# Patient Record
Sex: Female | Born: 1955
Health system: Southern US, Community
[De-identification: ages and names within clinical notes are randomized; demographics above are authoritative.]

## PROBLEM LIST (undated history)

## (undated) DIAGNOSIS — R7303 Prediabetes: Secondary | ICD-10-CM

## (undated) DIAGNOSIS — F32A Depression, unspecified: Secondary | ICD-10-CM

## (undated) DIAGNOSIS — K219 Gastro-esophageal reflux disease without esophagitis: Secondary | ICD-10-CM

## (undated) DIAGNOSIS — R010 Benign and innocent cardiac murmurs: Secondary | ICD-10-CM

## (undated) DIAGNOSIS — Z98811 Dental restoration status: Secondary | ICD-10-CM

## (undated) DIAGNOSIS — F419 Anxiety disorder, unspecified: Secondary | ICD-10-CM

## (undated) DIAGNOSIS — C801 Malignant (primary) neoplasm, unspecified: Secondary | ICD-10-CM

## (undated) DIAGNOSIS — I10 Essential (primary) hypertension: Secondary | ICD-10-CM

## (undated) DIAGNOSIS — R21 Rash and other nonspecific skin eruption: Secondary | ICD-10-CM

## (undated) DIAGNOSIS — F329 Major depressive disorder, single episode, unspecified: Secondary | ICD-10-CM

## (undated) DIAGNOSIS — Z853 Personal history of malignant neoplasm of breast: Secondary | ICD-10-CM

## (undated) HISTORY — DX: Essential (primary) hypertension: I10

## (undated) HISTORY — DX: Depression, unspecified: F32.A

## (undated) HISTORY — PX: TUBAL LIGATION: SHX77

## (undated) HISTORY — DX: Major depressive disorder, single episode, unspecified: F32.9

## (undated) HISTORY — PX: COLONOSCOPY: SHX174

## (undated) HISTORY — PX: DILATION AND CURETTAGE OF UTERUS: SHX78

---

## 2000-03-19 ENCOUNTER — Other Ambulatory Visit: Admission: RE | Admit: 2000-03-19 | Discharge: 2000-03-19 | Payer: Self-pay | Admitting: Gynecology

## 2001-04-15 ENCOUNTER — Other Ambulatory Visit: Admission: RE | Admit: 2001-04-15 | Discharge: 2001-04-15 | Payer: Self-pay | Admitting: Gynecology

## 2002-11-03 ENCOUNTER — Other Ambulatory Visit: Admission: RE | Admit: 2002-11-03 | Discharge: 2002-11-03 | Payer: Self-pay | Admitting: Gynecology

## 2004-04-11 ENCOUNTER — Other Ambulatory Visit: Admission: RE | Admit: 2004-04-11 | Discharge: 2004-04-11 | Payer: Self-pay | Admitting: Gynecology

## 2012-03-03 ENCOUNTER — Other Ambulatory Visit: Payer: Self-pay | Admitting: Family Medicine

## 2012-03-03 DIAGNOSIS — R221 Localized swelling, mass and lump, neck: Secondary | ICD-10-CM

## 2012-03-03 DIAGNOSIS — R220 Localized swelling, mass and lump, head: Secondary | ICD-10-CM

## 2012-03-04 ENCOUNTER — Ambulatory Visit
Admission: RE | Admit: 2012-03-04 | Discharge: 2012-03-04 | Disposition: A | Discharge status: Home / Self Care | Payer: 59 | Source: Ambulatory Visit | Attending: Family Medicine | Admitting: Family Medicine

## 2012-03-04 DIAGNOSIS — R220 Localized swelling, mass and lump, head: Secondary | ICD-10-CM

## 2012-03-04 DIAGNOSIS — R221 Localized swelling, mass and lump, neck: Secondary | ICD-10-CM

## 2012-03-04 MED ORDER — IOHEXOL 300 MG/ML  SOLN
75.0000 mL | Freq: Once | INTRAMUSCULAR | Status: AC | PRN
  Administered 2012-03-04: 75 mL via INTRAVENOUS

## 2012-10-02 ENCOUNTER — Other Ambulatory Visit: Payer: Self-pay | Admitting: Family Medicine

## 2012-10-02 DIAGNOSIS — R142 Eructation: Secondary | ICD-10-CM

## 2012-10-06 ENCOUNTER — Other Ambulatory Visit: Payer: 59

## 2012-10-17 ENCOUNTER — Other Ambulatory Visit: Payer: 59

## 2012-10-20 ENCOUNTER — Other Ambulatory Visit: Payer: 59

## 2012-10-23 ENCOUNTER — Other Ambulatory Visit: Payer: 59

## 2012-10-23 ENCOUNTER — Inpatient Hospital Stay: Admission: RE | Admit: 2012-10-23 | Payer: 59 | Source: Ambulatory Visit

## 2016-11-30 ENCOUNTER — Other Ambulatory Visit: Payer: Self-pay | Admitting: Obstetrics & Gynecology

## 2016-11-30 DIAGNOSIS — R928 Other abnormal and inconclusive findings on diagnostic imaging of breast: Secondary | ICD-10-CM

## 2016-12-03 ENCOUNTER — Other Ambulatory Visit: Payer: Self-pay | Admitting: Obstetrics & Gynecology

## 2016-12-03 ENCOUNTER — Other Ambulatory Visit: Payer: Self-pay | Admitting: Obstetrics and Gynecology

## 2016-12-03 DIAGNOSIS — E2839 Other primary ovarian failure: Secondary | ICD-10-CM

## 2016-12-07 ENCOUNTER — Ambulatory Visit
Admission: RE | Admit: 2016-12-07 | Discharge: 2016-12-07 | Disposition: A | Discharge status: Home / Self Care | Payer: 59 | Source: Ambulatory Visit | Attending: Obstetrics & Gynecology | Admitting: Obstetrics & Gynecology

## 2016-12-07 DIAGNOSIS — E2839 Other primary ovarian failure: Secondary | ICD-10-CM

## 2016-12-07 DIAGNOSIS — R928 Other abnormal and inconclusive findings on diagnostic imaging of breast: Secondary | ICD-10-CM

## 2017-05-29 ENCOUNTER — Other Ambulatory Visit: Payer: Self-pay | Admitting: Family Medicine

## 2017-05-29 ENCOUNTER — Other Ambulatory Visit: Payer: Self-pay | Admitting: Obstetrics & Gynecology

## 2017-05-29 DIAGNOSIS — R921 Mammographic calcification found on diagnostic imaging of breast: Secondary | ICD-10-CM

## 2017-06-24 ENCOUNTER — Other Ambulatory Visit: Payer: Self-pay | Admitting: Obstetrics & Gynecology

## 2017-06-24 ENCOUNTER — Encounter: Payer: Self-pay | Admitting: Radiology

## 2017-06-24 ENCOUNTER — Ambulatory Visit
Admission: RE | Admit: 2017-06-24 | Discharge: 2017-06-24 | Disposition: A | Discharge status: Home / Self Care | Payer: 59 | Source: Ambulatory Visit | Attending: Obstetrics & Gynecology | Admitting: Obstetrics & Gynecology

## 2017-06-24 DIAGNOSIS — R921 Mammographic calcification found on diagnostic imaging of breast: Secondary | ICD-10-CM

## 2017-06-26 ENCOUNTER — Ambulatory Visit
Admission: RE | Admit: 2017-06-26 | Discharge: 2017-06-26 | Disposition: A | Discharge status: Home / Self Care | Payer: 59 | Source: Ambulatory Visit | Attending: Obstetrics & Gynecology | Admitting: Obstetrics & Gynecology

## 2017-06-26 ENCOUNTER — Other Ambulatory Visit: Payer: Self-pay | Admitting: Obstetrics & Gynecology

## 2017-06-26 DIAGNOSIS — N63 Unspecified lump in unspecified breast: Secondary | ICD-10-CM

## 2017-06-27 ENCOUNTER — Telehealth: Payer: Self-pay | Admitting: Hematology and Oncology

## 2017-06-27 NOTE — Telephone Encounter (Signed)
Confirmed with patient upcoming appointment for Breast Clinic on 07/03/17, emailed patient intake form to [email or web address]. 06/27/17

## 2017-06-29 DIAGNOSIS — Z853 Personal history of malignant neoplasm of breast: Secondary | ICD-10-CM

## 2017-06-29 DIAGNOSIS — C801 Malignant (primary) neoplasm, unspecified: Secondary | ICD-10-CM

## 2017-06-29 HISTORY — DX: Malignant (primary) neoplasm, unspecified: C80.1

## 2017-06-29 HISTORY — DX: Personal history of malignant neoplasm of breast: Z85.3

## 2017-07-02 ENCOUNTER — Other Ambulatory Visit: Payer: Self-pay | Admitting: *Deleted

## 2017-07-02 DIAGNOSIS — C50412 Malignant neoplasm of upper-outer quadrant of left female breast: Secondary | ICD-10-CM | POA: Insufficient documentation

## 2017-07-02 DIAGNOSIS — Z170 Estrogen receptor positive status [ER+]: Principal | ICD-10-CM

## 2017-07-03 ENCOUNTER — Encounter: Payer: Self-pay | Admitting: Hematology and Oncology

## 2017-07-03 ENCOUNTER — Other Ambulatory Visit (HOSPITAL_BASED_OUTPATIENT_CLINIC_OR_DEPARTMENT_OTHER): Payer: 59

## 2017-07-03 ENCOUNTER — Encounter: Payer: Self-pay | Admitting: Physical Therapy

## 2017-07-03 ENCOUNTER — Ambulatory Visit
Admission: RE | Admit: 2017-07-03 | Discharge: 2017-07-03 | Disposition: A | Discharge status: Home / Self Care | Payer: 59 | Source: Ambulatory Visit | Attending: Radiation Oncology | Admitting: Radiation Oncology

## 2017-07-03 ENCOUNTER — Ambulatory Visit (HOSPITAL_BASED_OUTPATIENT_CLINIC_OR_DEPARTMENT_OTHER): Payer: 59 | Admitting: Hematology and Oncology

## 2017-07-03 ENCOUNTER — Ambulatory Visit: Payer: 59 | Attending: General Surgery | Admitting: Physical Therapy

## 2017-07-03 DIAGNOSIS — Z170 Estrogen receptor positive status [ER+]: Principal | ICD-10-CM

## 2017-07-03 DIAGNOSIS — C50412 Malignant neoplasm of upper-outer quadrant of left female breast: Secondary | ICD-10-CM

## 2017-07-03 DIAGNOSIS — M25612 Stiffness of left shoulder, not elsewhere classified: Secondary | ICD-10-CM | POA: Insufficient documentation

## 2017-07-03 DIAGNOSIS — R293 Abnormal posture: Secondary | ICD-10-CM | POA: Insufficient documentation

## 2017-07-03 LAB — CBC WITH DIFFERENTIAL/PLATELET
BASO%: 0.5 % (ref 0.0–2.0)
BASOS ABS: 0 10*3/uL (ref 0.0–0.1)
EOS ABS: 0.1 10*3/uL (ref 0.0–0.5)
EOS%: 1.9 % (ref 0.0–7.0)
HEMATOCRIT: 40.4 % (ref 34.8–46.6)
HEMOGLOBIN: 13.2 g/dL (ref 11.6–15.9)
LYMPH#: 1.5 10*3/uL (ref 0.9–3.3)
LYMPH%: 23.2 % (ref 14.0–49.7)
MCH: 29.7 pg (ref 25.1–34.0)
MCHC: 32.7 g/dL (ref 31.5–36.0)
MCV: 90.8 fL (ref 79.5–101.0)
MONO#: 0.5 10*3/uL (ref 0.1–0.9)
MONO%: 7.3 % (ref 0.0–14.0)
NEUT#: 4.4 10*3/uL (ref 1.5–6.5)
NEUT%: 67.1 % (ref 38.4–76.8)
PLATELETS: 258 10*3/uL (ref 145–400)
RBC: 4.45 10*6/uL (ref 3.70–5.45)
RDW: 13.1 % (ref 11.2–14.5)
WBC: 6.5 10*3/uL (ref 3.9–10.3)

## 2017-07-03 LAB — COMPREHENSIVE METABOLIC PANEL
ALBUMIN: 3.7 g/dL (ref 3.5–5.0)
ALK PHOS: 60 U/L (ref 40–150)
ALT: 25 U/L (ref 0–55)
ANION GAP: 10 meq/L (ref 3–11)
AST: 19 U/L (ref 5–34)
BUN: 19.6 mg/dL (ref 7.0–26.0)
CHLORIDE: 106 meq/L (ref 98–109)
CO2: 26 mEq/L (ref 22–29)
Calcium: 9.5 mg/dL (ref 8.4–10.4)
Creatinine: 0.8 mg/dL (ref 0.6–1.1)
EGFR: 78 mL/min/{1.73_m2} — AB (ref >90)
Glucose: 93 mg/dl (ref 70–140)
POTASSIUM: 3.5 meq/L (ref 3.5–5.1)
Sodium: 142 mEq/L (ref 136–145)
Total Bilirubin: 0.53 mg/dL (ref 0.20–1.20)
Total Protein: 7.6 g/dL (ref 6.4–8.3)

## 2017-07-03 NOTE — Therapy (Signed)
[place], [place], [address] Phone: [phone]   Fax:  [phone]  Physical Therapy Evaluation  Patient Details  Name: ILINA XU MRN: [id number] Date of Birth: 07/21/1956 Referring Provider: Dr. Excell Seltzer  Encounter Date: 07/03/2017      PT End of Session - 07/03/17 1104    Visit Number 1   Number of Visits 1   PT Start Time 0947   PT Stop Time 1015   PT Time Calculation (min) 28 min   Activity Tolerance Patient tolerated treatment well   Behavior During Therapy [facility] for tasks assessed/performed      Past Medical History:  Diagnosis Date  . Depression   . Hypertension     History reviewed. No pertinent surgical history.  There were no vitals filed for this visit.       Subjective Assessment - 07/03/17 1026    Subjective Patient reports she is here today to be seen by her medical team for her newly diagnosed left breast cancer.   Patient is accompained by: Family member   Pertinent History Patient was diagnosed on 06/24/17 with left grade 1-2 invasive lobular carcinoma breast cancer. There are 2 areas of cancer located in the upper inner quadrant and upper outer quadrant which is an area of distortion with calcs. It is ER/PR positive and HER2 negative with a Ki67 of 2%. She had physical therapy earlier this year (patient estimated she had 8 visits) for left shoulder pain and stiffness which she reports is still an issue for her at times.   Patient Stated Goals Reduce lymphedema risk and learn post op shoulder ROM HEP   Currently in Pain? No/denies            [facility] PT Assessment - 07/03/17 0001      Assessment   Medical Diagnosis Left breast cancer   Referring Provider Dr. Excell Seltzer   Onset Date/Surgical Date 06/24/17   Hand Dominance Right   Prior Therapy none     Precautions   Precautions Other (comment)   Precaution Comments Active cancer     Restrictions    Weight Bearing Restrictions No     Balance Screen   Has the patient fallen in the past 6 months No   Has the patient had a decrease in activity level because of a fear of falling?  No   Is the patient reluctant to leave their home because of a fear of falling?  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; on phone and computer; some carrying and lifting up to 25#   Leisure She does not exercise     Cognition   Overall Cognitive Status Within Functional Limits for tasks assessed     Posture/Postural Control   Posture/Postural Control Postural limitations   Postural Limitations Rounded Shoulders;Forward head     ROM / Strength   AROM / PROM / Strength AROM;Strength     AROM   AROM Assessment Site Shoulder;Cervical   Right/Left Shoulder Right;Left   Right Shoulder Extension 50 Degrees   Right Shoulder Flexion 150 Degrees   Right Shoulder ABduction 140 Degrees   Right Shoulder Internal Rotation 63 Degrees   Right Shoulder External Rotation 87 Degrees  Left Shoulder Extension 48 Degrees   Left Shoulder Flexion 147 Degrees   Left Shoulder ABduction 152 Degrees   Left Shoulder Internal Rotation 70 Degrees   Left Shoulder External Rotation 77 Degrees   Cervical Flexion WNL   Cervical Extension WNL   Cervical - Right Side Bend WNL   Cervical - Left Side Bend 25% limited   Cervical - Right Rotation WNL   Cervical - Left Rotation 25% limited     Strength   Overall Strength Within functional limits for tasks performed           LYMPHEDEMA/ONCOLOGY QUESTIONNAIRE - 07/03/17 1102      Type   Cancer Type Left breast cancer     Lymphedema Assessments   Lymphedema Assessments Upper extremities     Right Upper Extremity Lymphedema   10 cm  Proximal to Olecranon Process 33.2 cm   Olecranon Process 28.1 cm   10 cm Proximal to Ulnar Styloid Process 23.3 cm   Just Proximal to Ulnar Styloid Process 15.6 cm   Across Hand at [employer] 17.8 cm   At [place] of 2nd Digit 5.8 cm     Left Upper Extremity Lymphedema   10 cm Proximal to Olecranon Process 32.1 cm   Olecranon Process 27.5 cm   10 cm Proximal to Ulnar Styloid Process 21.8 cm   Just Proximal to Ulnar Styloid Process 15.3 cm   Across Hand at [employer] 18 cm   At [place] of 2nd Digit 6.1 cm         Objective measurements completed on examination: See above findings.     Patient was instructed today in a home exercise program today for post op shoulder range of motion. These included active assist shoulder flexion in sitting, scapular retraction, wall walking with shoulder abduction, and hands behind head external rotation.  She was encouraged to do these twice a day, holding 3 seconds and repeating 5 times when permitted by her physician.         PT Education - 07/03/17 1103    Education provided Yes   Education Details Lymphedema risk reduction and post op shoulder ROM HEP   Person(s) Educated Patient;Spouse   Methods Explanation;Demonstration;Handout   Comprehension Returned demonstration;Verbalized understanding              Breast Clinic Goals - 07/03/17 1116      Patient will be able to verbalize understanding of pertinent lymphedema risk reduction practices relevant to her diagnosis specifically related to skin care.   Time 1   Period Days   Status Achieved     Patient will be able to return demonstrate and/or verbalize understanding of the post-op home exercise program related to regaining shoulder range of motion.   Time 1   Period Days   Status Achieved     Patient will be able to verbalize understanding of the importance of attending the postoperative After Breast Cancer Class for further lymphedema risk reduction education and  therapeutic exercise.   Time 1   Period Days   Status Achieved               Plan - 07/03/17 1104    Clinical Impression Statement Patient was diagnosed on 06/24/17 with left grade 1-2 invasive lobular carcinoma breast cancer. There are 2 areas of cancer located in the upper inner quadrant and upper outer quadrant which is an area of distortion with calcs. It is ER/PR positive and HER2 negative with a  Ki67 of 2%. She had physical therapy earlier this year (patient estimated she had 8 visits) for left shoulder pain and stiffness which she reports is still an issue for her at times. Her multidisciplinary medical team met prior to her assessments to determine a recommended treatment plan. She is planning to have a left mastectomy and sentinel node biopsy followed by anti-estrogen therapy. She may benefit from post op PT to regain shoulder ROM and reduce lymphedema risk.   History and Personal Factors relevant to plan of care: Previous recent left shoulder impairment requiring physical therapy which flares up at times.   Clinical Presentation Evolving   Clinical Presentation due to: Recently diagnosed with cancer; unsure of surgical decision to have double lumpectomy versus a mastectomy with or without reconstruction.   Clinical Decision Making Moderate   Rehab Potential Excellent   Clinical Impairments Affecting Rehab Potential Pre-existing shoulder dysfunction   PT Frequency One time visit   PT Treatment/Interventions Patient/family education;Therapeutic exercise   PT Next Visit Plan Will f/u after surgery to determine PT needs   PT Home Exercise Plan Post op shoulder ROM HEP   Consulted and Agree with Plan of Care Patient;Family member/caregiver   Family Member Consulted Husband      Patient will benefit from skilled therapeutic intervention in order to improve the following deficits and impairments:  Postural dysfunction, Decreased knowledge of precautions, Pain, Impaired UE functional  use, Decreased range of motion  Visit Diagnosis: Carcinoma of upper-outer quadrant of left breast in female, estrogen receptor positive ([place]) - Plan: PT plan of care cert/re-cert  Abnormal posture - Plan: PT plan of care cert/re-cert  Stiffness of left shoulder, not elsewhere classified - Plan: PT plan of care cert/re-cert   Patient will follow up at outpatient cancer rehab if needed following surgery.  If the patient requires physical therapy at that time, a specific plan will be dictated and sent to the referring physician for approval. The patient was educated today on appropriate basic range of motion exercises to begin post operatively and the importance of attending the After Breast Cancer class following surgery.  Patient was educated today on lymphedema risk reduction practices as it pertains to recommendations that will benefit the patient immediately following surgery.  She verbalized good understanding.  No additional physical therapy is indicated at this time.      Problem List Patient Active Problem List   Diagnosis Date Noted  . Malignant neoplasm of upper-outer quadrant of left breast in female, estrogen receptor positive ([place]) 07/02/2017    Annia Friendly, PT 07/03/17 11:18 AM  [place], [place], [address] Phone: [phone]   Fax:  [phone]  Name: HILA BOLDING MRN: [id number] Date of Birth: 25-Apr-1956

## 2017-07-03 NOTE — Progress Notes (Signed)
Nutrition Assessment  Reason for Assessment:  Pt seen in Breast Clinic  ASSESSMENT:   61 year old female with new diagnosis of breast cancer.  Past medical history of HTN, depression.    Patient reports normal appetite.  Medications:  reviewed  Labs: reviewed  Anthropometrics:   Height: 60 inches Weight: 200 lb BMI: 38   NUTRITION DIAGNOSIS: Food and nutrition related knowledge deficit related to new diagnosis of breast cancer as evidenced by no prior need for nutrition related information.  INTERVENTION:   Discussed and provided packet of information regarding nutritional tips for breast cancer patients.  Questions answered.  Teachback method used.  Contact information provided and patient knows to contact me with questions/concerns.    MONITORING, EVALUATION, and GOAL: Pt will consume a healthy plant based diet to maintain lean body mass throughout treatment.   Misty Lynch, Misty Lynch, Misty Lynch Registered Dietitian [phone] (pager)

## 2017-07-03 NOTE — Progress Notes (Signed)
[name]  Patient Care Team: Kathyrn Lass, MD as PCP - General (Family Medicine) Excell Seltzer, MD as Consulting Physician (General Surgery) Nicholas Lose, MD as Consulting Physician (Hematology and Oncology) Kyung Rudd, MD as Consulting Physician (Radiation Oncology)  CHIEF COMPLAINTS/PURPOSE OF CONSULTATION:  Newly diagnosed breast cancer  HISTORY OF PRESENTING ILLNESS:  Misty Lynch 61 y.o. female is here because of recent diagnosis of left breast cancer. Patient had a routine screening mammogram that detected an abnormality in the left breast but this was further evaluated by ultrasound. The left breast upper outer quadrant 2 mm area of calcifications along with distortion was noted this was biopsied. A second area off on distortion was noted in the upper inner quadrant which was also biopsied proven to be breast cancer. Both of these biopsies are 7.1 cm apart. A stereotactic biopsy was performed which revealed grade 1-2 invasive lobular cancer that is ER/PR positive HER-2 negative with a Ki-67 2%. She was presented this morning in the multidisciplinary tumor board and she is here today to discuss treatment plan.  I reviewed her records extensively and collaborated the history with the patient.  SUMMARY OF ONCOLOGIC HISTORY:   Malignant neoplasm of upper-outer quadrant of left breast in female, estrogen receptor positive ([name])   06/24/2017 Initial Diagnosis    Left breast biopsy upper outer quadrant: Invasive lobular cancer with LCIS; upper inner quadrant: Invasive lobular cancer with ADH, grade 1-2, ER 40%, PR 90%, Ki-67 2%, HER-2 negative ratio 1.19; tumor size indeterminate but based on the 2 mm calcifications it is currently being staged as T1 1 N0 stage IA, however we suspect that it is inaccurate clinical staging      MEDICAL HISTORY:  Past Medical History:  Diagnosis Date  . Depression   . Hypertension     SURGICAL HISTORY: History  reviewed. No pertinent surgical history.  SOCIAL HISTORY: Social History   Social History  . Marital status: Married    Spouse name: N/A  . Number of children: N/A  . Years of education: N/A   Occupational History  . Not on file.   Social History Main Topics  . Smoking status: Never Smoker  . Smokeless tobacco: Never Used  . Alcohol use 0.6 oz/week    1 Glasses of wine per week  . Drug use: No  . Sexual activity: Not on file   Other Topics Concern  . Not on file   Social History Narrative  . No narrative on file    FAMILY HISTORY: Family History  Problem Relation Age of Onset  . Bladder Cancer Father     ALLERGIES:  is allergic to tetracyclines & related.  MEDICATIONS:  Current Outpatient Prescriptions  Medication Sig Dispense Refill  . aspirin EC 81 MG tablet Take 81 mg by mouth daily.    . Calcium-Vitamin D-Vitamin K 500-100-40 MG-UNT-MCG CHEW Chew by mouth 2 (two) times daily with a meal.    . losartan-hydrochlorothiazide (HYZAAR) 100-25 MG tablet Take 1 tablet by mouth daily.    Marland Kitchen venlafaxine XR (EFFEXOR-XR) 75 MG 24 hr capsule Take 75 mg by mouth daily with breakfast.     No current facility-administered medications for this visit.     REVIEW OF SYSTEMS:   Constitutional: Denies fevers, chills or abnormal night sweats Eyes: Denies blurriness of vision, double vision or watery eyes Ears, nose, mouth, throat, and face: Denies mucositis or sore throat Respiratory: Denies cough, dyspnea or wheezes Cardiovascular: Denies palpitation, chest discomfort  or lower extremity swelling Gastrointestinal:  Denies nausea, heartburn or change in bowel habits Skin: Denies abnormal skin rashes Lymphatics: Denies new lymphadenopathy or easy bruising Neurological:Denies numbness, tingling or new weaknesses Behavioral/Psych: Mood is stable, no new changes  Breast:  Denies any palpable lumps or discharge All other systems were reviewed with the patient and are  negative.  PHYSICAL EXAMINATION: ECOG PERFORMANCE STATUS: 1 - Symptomatic but completely ambulatory  Vitals:   07/03/17 0846  BP: (!) 137/58  Pulse: 73  Resp: 18  Temp: 98.5 F (36.9 C)  SpO2: 98%   Filed Weights   07/03/17 0846  Weight: 200 lb 12.8 oz (91.1 kg)    GENERAL:alert, no distress and comfortable SKIN: skin color, texture, turgor are normal, no rashes or significant lesions EYES: normal, conjunctiva are pink and non-injected, sclera clear OROPHARYNX:no exudate, no erythema and lips, buccal mucosa, and tongue normal  NECK: supple, thyroid normal size, non-tender, without nodularity LYMPH:  no palpable lymphadenopathy in the cervical, axillary or inguinal LUNGS: clear to auscultation and percussion with normal breathing effort HEART: regular rate & rhythm and no murmurs and no lower extremity edema ABDOMEN:abdomen soft, non-tender and normal bowel sounds Musculoskeletal:no cyanosis of digits and no clubbing  PSYCH: alert & oriented x 3 with fluent speech NEURO: no focal motor/sensory deficits BREAST: No palpable nodules in breast. No palpable axillary or supraclavicular lymphadenopathy (exam performed in the presence of a chaperone)   LABORATORY DATA:  I have reviewed the data as listed Lab Results  Component Value Date   WBC 6.5 07/03/2017   HGB 13.2 07/03/2017   HCT 40.4 07/03/2017   MCV 90.8 07/03/2017   PLT 258 07/03/2017   Lab Results  Component Value Date   NA 142 07/03/2017   K 3.5 07/03/2017   CO2 26 07/03/2017    RADIOGRAPHIC STUDIES: I have personally reviewed the radiological reports and agreed with the findings in the report.  ASSESSMENT AND PLAN:  Malignant neoplasm of upper-outer quadrant of left breast in female, estrogen receptor positive ([name]) 06/24/2017: Left breast biopsy upper outer quadrant: Invasive lobular cancer with LCIS; upper inner quadrant: Invasive lobular cancer with ADH, grade 1-2, ER 40%, PR 90%, Ki-67 2%, HER-2 negative  ratio 1.19; tumor size indeterminate but based on the 2 mm calcifications it is currently being staged as T1a N0 stage IA, however we suspect that it is inaccurate clinical staging  Pathology and radiology counseling: Discussed with the patient, the details of pathology including the type of breast cancer,the clinical staging, the significance of ER, PR and HER-2/neu receptors and the implications for treatment. After reviewing the pathology in detail, we proceeded to discuss the different treatment options between surgery, radiation, chemotherapy, antiestrogen therapies.  Recommendation based on multidisciplinary tumor board 1. Patient would need mastectomy given the small size of the breasts and the wide extent of disease.. Unable to determine the actual size but she has 2 areas both of which were biopsy-proven invasive lobular cancers that are 7.1 cm apart. 2. followed by Oncotype DX if the final tumor size greater than 1 cm 3. Followed by adjuvant antiestrogen therapy with anastrozole 1 mg by mouth daily 7 years  Return to clinic after surgery to discuss final pathology report and to determine if Oncotype DX needs to be sent.    All questions were answered. The patient knows to call the clinic with any problems, questions or concerns.    Rulon Eisenmenger, MD 07/03/17

## 2017-07-03 NOTE — Patient Instructions (Signed)

## 2017-07-03 NOTE — Assessment & Plan Note (Signed)
06/24/2017: Left breast biopsy upper outer quadrant: Invasive lobular cancer with LCIS; upper inner quadrant: Invasive lobular cancer with ADH, grade 1-2, ER 40%, PR 90%, Ki-67 2%, HER-2 negative ratio 1.19; tumor size indeterminate but based on the 2 mm calcifications it is currently being staged as T1a N0 stage IA, however we suspect that it is inaccurate clinical staging  Pathology and radiology counseling: Discussed with the patient, the details of pathology including the type of breast cancer,the clinical staging, the significance of ER, PR and HER-2/neu receptors and the implications for treatment. After reviewing the pathology in detail, we proceeded to discuss the different treatment options between surgery, radiation, chemotherapy, antiestrogen therapies.  Recommendation based on multidisciplinary tumor board 1. Patient would need mastectomy given the small size of the breasts and the wide extent of disease.. Unable to determine the actual size but she has 2 areas both of which were biopsy-proven invasive lobular cancers that are 7.1 cm apart. 2. followed by Oncotype DX if the final tumor size greater than 1 cm 3. Followed by adjuvant antiestrogen therapy with anastrozole 1 mg by mouth daily 7 years  Return to clinic after surgery to discuss final pathology report and to determine if Oncotype DX needs to be sent.

## 2017-07-03 NOTE — Progress Notes (Signed)
Radiation Oncology         [phone] ________________________________  Name: Misty Lynch        MRN: [id number]  Date of Service: 07/03/2017 DOB: 1956-08-16  RS:WNIOEV, Misty Haw, MD  Excell Seltzer, MD     REFERRING PHYSICIAN: Excell Seltzer, MD   DIAGNOSIS: The encounter diagnosis was Malignant neoplasm of upper-outer quadrant of left breast in female, estrogen receptor positive (Misty Lynch).   HISTORY OF PRESENT ILLNESS: Misty Lynch is a 61 y.o. female seen in the multidisciplinary breast clinic for a new diagnosis of left breast cancer. She was found on diagnostic mammogram while being followed for 2 groups of calcifications to have stable change within one of the groups, but new distortion in the breast approximately 7 cm apart from the primary site. A biopsy on 06/24/17 of both locations revealed invasive lobular carcinoma with atypical ductal hyperplasia , in the upper inner quadrant, as well as invasive lobular carcinoma with LCIS in the upper outer quadrant, the grade of the cancer is 1-2, and her tumors were ER/PR positive, HER2 negative, with a Ki67 of 2%.  An ultrasound on 06/26/17 was negative for adenopathy. She comes today to discuss options of treatment for her cancer.  PREVIOUS RADIATION THERAPY: No   PAST MEDICAL HISTORY:  Past Medical History:  Diagnosis Date  . Depression   . Hypertension        PAST SURGICAL HISTORY:No past surgical history on file.   FAMILY HISTORY:  Family History  Problem Relation Age of Onset  . Bladder Cancer Father      SOCIAL HISTORY:  reports that she has never smoked. She has never used smokeless tobacco. She reports that she drinks about 0.6 oz of alcohol per week . She reports that she does not use drugs. The patient is married and lives in [place]. She works in Therapist, art.   ALLERGIES: Tetracyclines & related   MEDICATIONS:  Current Outpatient Prescriptions  Medication Sig Dispense Refill  . aspirin EC 81 MG  tablet Take 81 mg by mouth daily.    . Calcium-Vitamin D-Vitamin K 500-100-40 MG-UNT-MCG CHEW Chew by mouth 2 (two) times daily with a meal.    . losartan-hydrochlorothiazide (HYZAAR) 100-25 MG tablet Take 1 tablet by mouth daily.    Marland Kitchen venlafaxine XR (EFFEXOR-XR) 75 MG 24 hr capsule Take 75 mg by mouth daily with breakfast.     No current facility-administered medications for this encounter.      REVIEW OF SYSTEMS: On review of systems, the patient reports that she is doing well overall. She denies any chest pain, shortness of breath, cough, fevers, chills, night sweats, unintended weight changes. She denies any bowel or bladder disturbances, and denies abdominal pain, nausea or vomiting. She denies any new musculoskeletal or joint aches or pains. A complete review of systems is obtained and is otherwise negative.    PHYSICAL EXAM:  Wt Readings from Last 3 Encounters:  07/03/17 200 lb 12.8 oz (91.1 kg)   Temp Readings from Last 3 Encounters:  07/03/17 98.5 F (36.9 C) (Oral)   BP Readings from Last 3 Encounters:  07/03/17 (!) 137/58   Pulse Readings from Last 3 Encounters:  07/03/17 73      In general this is a well appearing caucasian female in no acute distress. She is alert and oriented x4 and appropriate throughout the examination. HEENT reveals that the patient is normocephalic, atraumatic. EOMs are intact. PERRLA. Skin is intact without any evidence of gross lesions.  Cardiovascular exam reveals a regular rate and rhythm, no clicks rubs or murmurs are auscultated. Chest is clear to auscultation bilaterally. Lymphatic assessment is performed and does not reveal any adenopathy in the cervical, supraclavicular, axillary, or inguinal chains. Bilateral breast exam is performed and reveals post biopsy change in the left breast with mild induration deep to the biopsy site. No palpable masses are noted otherwise. No masses are noted in the right breast, and neither breast has nipple bleeding  or discharge.  Abdomen has active bowel sounds in all quadrants and is intact. The abdomen is soft, non tender, non distended. Lower extremities are negative for pretibial pitting edema, deep calf tenderness, cyanosis or clubbing.   ECOG = 0  0 - Asymptomatic (Fully active, able to carry on all predisease activities without restriction)  1 - Symptomatic but completely ambulatory (Restricted in physically strenuous activity but ambulatory and able to carry out work of a light or sedentary nature. For example, light housework, office work)  2 - Symptomatic, <50% in bed during the day (Ambulatory and capable of all self care but unable to carry out any work activities. Up and about more than 50% of waking hours)  3 - Symptomatic, >50% in bed, but not bedbound (Capable of only limited self-care, confined to bed or chair 50% or more of waking hours)  4 - Bedbound (Completely disabled. Cannot carry on any self-care. Totally confined to bed or chair)  5 - Death   Misty Lynch MM, Misty Lynch, Misty Lynch, et al. [phone]). "Toxicity and response criteria of the [facility]". Am. Misty Lynch. Oncol. 5 (6): 649-55    LABORATORY DATA:  Lab Results  Component Value Date   WBC 6.5 07/03/2017   HGB 13.2 07/03/2017   HCT 40.4 07/03/2017   MCV 90.8 07/03/2017   PLT 258 07/03/2017   Lab Results  Component Value Date   NA 142 07/03/2017   K 3.5 07/03/2017   CO2 26 07/03/2017   Lab Results  Component Value Date   ALT 25 07/03/2017   AST 19 07/03/2017   ALKPHOS 60 07/03/2017   BILITOT 0.53 07/03/2017      RADIOGRAPHY: Mm Diag Breast Tomo Uni Left  Result Date: 06/24/2017 CLINICAL DATA:  Six-month follow-up for probably benign left breast calcifications. EXAM: 2D DIGITAL DIAGNOSTIC UNILATERAL LEFT MAMMOGRAM WITH CAD AND ADJUNCT TOMO COMPARISON:  Previous exam(s). ACR Breast Density Category c: The breast tissue is heterogeneously dense, which may obscure small masses. FINDINGS: In the  upper-outer quadrant of the left breast, posterior depth the 2 mm group of punctate and round calcifications have not significantly changed. However, associated with these calcifications there is a new subtle distorted appearance of the tissue warranting biopsy. The subtle calcifications in the medial aspect of the left breast are less prominent than on the comparison exam. Mammographic images were processed with CAD. IMPRESSION: 1. The calcifications in the upper-outer quadrant of the left breast are stable, however there is a new subtle distorted appearance to the tissue at this site warranting biopsy. 2. The faint calcifications in the medial aspect of the left breast are less apparent than on the comparison exam. RECOMMENDATION: Stereotactic biopsy is recommended for the calcifications with subtle distortion in the upper-outer left breast, as well as the faint calcifications in the medial left breast. These 2 procedures have been added on to today's interventional schedule. I have discussed the findings and recommendations with the patient. Results were also provided in writing at the conclusion  of the visit. If applicable, a reminder letter will be sent to the patient regarding the next appointment. BI-RADS CATEGORY  4: Suspicious. Electronically Signed   By: Ammie Ferrier M.D.   On: 06/24/2017 13:41   [place] Axilla Left  Result Date: 06/26/2017 CLINICAL DATA:  61 year old female with a recent diagnosis of invasive lobular carcinoma. EXAM: ULTRASOUND OF THE LEFT AXILLA COMPARISON:  None. FINDINGS: Ultrasound is performed, showing multiple morphologically normal lymph nodes within the left axilla. No suspicious lymphadenopathy is identified. IMPRESSION: No suspicious left axillary lymphadenopathy. RECOMMENDATION: Recommendation is for clinical and surgical follow-up for the patient's recently diagnosed left breast malignancy. I have discussed the findings and recommendations with the patient. Results were  also provided in writing at the conclusion of the visit. If applicable, a reminder letter will be sent to the patient regarding the next appointment. BI-RADS CATEGORY  2: Benign. Electronically Signed   By: Kristopher Oppenheim M.D.   On: 06/26/2017 14:01   Mm Clip Placement Left  Result Date: 06/24/2017 CLINICAL DATA:  60 year old female status post tomosynthesis guided biopsy of calcifications in the upper, outer left breast and within the upper, inner left breast EXAM: DIAGNOSTIC LEFT MAMMOGRAM POST STEREOTACTIC BIOPSY COMPARISON:  Previous exam(s). FINDINGS: Mammographic images were obtained following tomosynthesis guided biopsy of calcifications in the upper, outer left breast and within the upper, inner left breast. Post biopsy mammogram demonstrates the coil shaped biopsy marker to be in the expected location within the upper, outer left breast. The X shaped biopsy marker is approximately 2 cm superior to the expected location within the upper, inner left breast. IMPRESSION: 1. The coil shaped biopsy marker is in the expected location in the upper, outer left breast. 2. The X shaped biopsy marker is approximately 2 cm superior to the expected biopsy location in the upper, inner left breast. Final Assessment: Post Procedure Mammograms for Marker Placement Electronically Signed   By: Pamelia Hoit M.D.   On: 06/24/2017 10:39   Mm Lt Breast Bx W Loc Dev 1st Lesion Image Bx Spec Stereo Guide  Addendum Date: 06/26/2017   ADDENDUM REPORT: 06/26/2017 15:21 ADDENDUM: Pathology revealed INVASIVE LOBULAR CARCINOMA, LOBULAR CARCINOMA IN SITU WITH CALCIFICATIONS of the Left breast, upper outer quadrant. INVASIVE LOBULAR CARCINOMA., ATYPICAL DUCTAL HYPERPLASIA WITH CALCIFICATIONS of the Left breast, upper inner quadrant. This was found to be concordant by Dr. Pamelia Hoit. Pathology results were discussed with the patient by telephone. The patient reported doing well after the biopsies with tenderness at the sites. Post  biopsy instructions and care were reviewed and questions were answered. The patient was encouraged to call [facility] for any additional concerns. The patient was referred to [facility] on July 03, 2017. Recommendation for bilateral breast MRI given the lobular histology. Pathology results reported by Terie Purser, RN on 06/26/2017. Electronically Signed   By: Pamelia Hoit M.D.   On: 06/26/2017 15:21   Result Date: 06/26/2017 CLINICAL DATA:  61 year old female for tomosynthesis guided biopsy of indeterminate calcifications within the upper, outer left breast and within the upper, inner left breast. EXAM: LEFT BREAST STEREOTACTIC CORE NEEDLE BIOPSY COMPARISON:  Previous exams. FINDINGS: 1) The patient and I discussed the procedure of stereotactic-guided biopsy including benefits and alternatives. We discussed the high likelihood of a successful procedure. We discussed the risks of the procedure including infection, bleeding, tissue injury, clip migration, and inadequate sampling. Informed written consent was given.  The usual time out protocol was performed immediately prior to the procedure. Using sterile technique and 1% Lidocaine as local anesthetic, under stereotactic guidance, a 9 gauge vacuum assisted device was used to perform core needle biopsy of calcifications in the upper, outer quadrant of the left breast using a superior to inferior approach. Specimen radiograph was performed showing calcifications within the specimen. Specimens with calcifications are identified for pathology. Lesion quadrant: Upper outer quadrant At the conclusion of the procedure, a coil shaped tissue marker clip was deployed into the biopsy cavity. Follow-up 2-view mammogram was performed and dictated separately. 2) The patient and I discussed the procedure of stereotactic-guided biopsy including benefits and alternatives. We  discussed the high likelihood of a successful procedure. We discussed the risks of the procedure including infection, bleeding, tissue injury, clip migration, and inadequate sampling. Informed written consent was given. The usual time out protocol was performed immediately prior to the procedure. Using sterile technique and 1% Lidocaine as local anesthetic, under stereotactic guidance, a 9 gauge vacuum assisted device was used to perform core needle biopsy of calcifications in the upper, inner quadrant of the left breast using a superior to inferior approach. Specimen radiograph was performed showing calcifications within the specimen. Specimens with calcifications are identified for pathology. Lesion quadrant: Upper inner quadrant At the conclusion of the procedure, an X shaped tissue marker clip was deployed into the biopsy cavity. Follow-up 2-view mammogram was performed and dictated separately. IMPRESSION: 1. Stereotactic-guided biopsy of indeterminate calcifications in the upper, outer left breast. No apparent complications. 2. Stereotactic-guided biopsy of indeterminate calcifications in the upper, inner left breast. No apparent complications. Electronically Signed: By: Pamelia Hoit M.D. On: 06/24/2017 10:36   Mm Lt Breast Bx W Loc Dev Ea Ad Lesion Img Bx Spec Stereo Guide  Addendum Date: 06/26/2017   ADDENDUM REPORT: 06/26/2017 15:21 ADDENDUM: Pathology revealed INVASIVE LOBULAR CARCINOMA, LOBULAR CARCINOMA IN SITU WITH CALCIFICATIONS of the Left breast, upper outer quadrant. INVASIVE LOBULAR CARCINOMA., ATYPICAL DUCTAL HYPERPLASIA WITH CALCIFICATIONS of the Left breast, upper inner quadrant. This was found to be concordant by Dr. Pamelia Hoit. Pathology results were discussed with the patient by telephone. The patient reported doing well after the biopsies with tenderness at the sites. Post biopsy instructions and care were reviewed and questions were answered. The patient was encouraged to call [facility] for any additional concerns. The patient was referred to [facility] on July 03, 2017. Recommendation for bilateral breast MRI given the lobular histology. Pathology results reported by Terie Purser, RN on 06/26/2017. Electronically Signed   By: Pamelia Hoit M.D.   On: 06/26/2017 15:21   Result Date: 06/26/2017 CLINICAL DATA:  61 year old female for tomosynthesis guided biopsy of indeterminate calcifications within the upper, outer left breast and within the upper, inner left breast. EXAM: LEFT BREAST STEREOTACTIC CORE NEEDLE BIOPSY COMPARISON:  Previous exams. FINDINGS: 1) The patient and I discussed the procedure of stereotactic-guided biopsy including benefits and alternatives. We discussed the high likelihood of a successful procedure. We discussed the risks of the procedure including infection, bleeding, tissue injury, clip migration, and inadequate sampling. Informed written consent was given. The usual time out protocol was performed immediately prior to the procedure. Using sterile technique and 1% Lidocaine as local anesthetic, under stereotactic guidance, a 9 gauge vacuum assisted device was used to perform core needle biopsy of calcifications in the upper, outer quadrant of the left breast  using a superior to inferior approach. Specimen radiograph was performed showing calcifications within the specimen. Specimens with calcifications are identified for pathology. Lesion quadrant: Upper outer quadrant At the conclusion of the procedure, a coil shaped tissue marker clip was deployed into the biopsy cavity. Follow-up 2-view mammogram was performed and dictated separately. 2) The patient and I discussed the procedure of stereotactic-guided biopsy including benefits and alternatives. We discussed the high likelihood of a successful procedure. We discussed the risks of the procedure including infection,  bleeding, tissue injury, clip migration, and inadequate sampling. Informed written consent was given. The usual time out protocol was performed immediately prior to the procedure. Using sterile technique and 1% Lidocaine as local anesthetic, under stereotactic guidance, a 9 gauge vacuum assisted device was used to perform core needle biopsy of calcifications in the upper, inner quadrant of the left breast using a superior to inferior approach. Specimen radiograph was performed showing calcifications within the specimen. Specimens with calcifications are identified for pathology. Lesion quadrant: Upper inner quadrant At the conclusion of the procedure, an X shaped tissue marker clip was deployed into the biopsy cavity. Follow-up 2-view mammogram was performed and dictated separately. IMPRESSION: 1. Stereotactic-guided biopsy of indeterminate calcifications in the upper, outer left breast. No apparent complications. 2. Stereotactic-guided biopsy of indeterminate calcifications in the upper, inner left breast. No apparent complications. Electronically Signed: By: Pamelia Hoit M.D. On: 06/24/2017 10:36       IMPRESSION/PLAN: 1. At least Stage IA , ER/PR positive, grade 1-2 invasive lobular carcinoma with LCIS of the left breast. Dr. Lisbeth Renshaw discusses the pathology findings and reviews the nature of invasive lobular disease. The consensus from the breast conference includes either mastectomy with reconstruction versus  breast conservation with lumpectomy. Both surgical options would include sentinel mapping. Depending on the size of the tumor, oncotype may be ordered to determine a role for chemotherapy. If the patient did elect for conservation surgery, the patient should consider external radiotherapy to the breast followed by antiestrogen therapy. The timing of this would depend as well if there was a role for chemotherapy. We discussed the risks, benefits, short, and long term effects of radiotherapy. Dr. Lisbeth Renshaw  discusses the delivery and logistics of radiotherapy and again if she had lumpectomy, we would anticipate a 4 week course of radiation. We will follow up with her progress and her course once she's decided how she'd like to proceed surgically.  The above documentation reflects my direct findings during this shared patient visit. Please see the separate note by Dr. Lisbeth Renshaw on this date for the remainder of the patient's plan of care.    Carola Rhine, PAC

## 2017-07-05 ENCOUNTER — Encounter: Payer: Self-pay | Admitting: *Deleted

## 2017-07-09 ENCOUNTER — Telehealth: Payer: Self-pay | Admitting: *Deleted

## 2017-07-09 NOTE — Telephone Encounter (Signed)
Spoke to pt regarding [place] from 07/03/17. Denies questions or concerns regarding dx or treatment care plan. Pt relate she is contemplating having bilateral mastectomy and did not know if she was going to have reconstruction. Informed pt to call and let us know when she has decided. Received verbal understanding.

## 2017-07-18 ENCOUNTER — Telehealth: Payer: Self-pay | Admitting: *Deleted

## 2017-07-18 NOTE — Telephone Encounter (Signed)
Return pt call regarding sx. Left vm requesting pt to return call to discuss surgery decision. Contact information provided.

## 2017-07-18 NOTE — Telephone Encounter (Signed)
Pt called to relate she has decided to have left mastectomy without reconstruction. Physician team notified. Discussed next steps with pt

## 2017-07-22 ENCOUNTER — Ambulatory Visit: Payer: Self-pay | Admitting: General Surgery

## 2017-07-22 DIAGNOSIS — Z170 Estrogen receptor positive status [ER+]: Principal | ICD-10-CM

## 2017-07-22 DIAGNOSIS — C50912 Malignant neoplasm of unspecified site of left female breast: Secondary | ICD-10-CM

## 2017-07-26 ENCOUNTER — Ambulatory Visit: Payer: Self-pay | Admitting: General Surgery

## 2017-07-29 ENCOUNTER — Telehealth: Payer: Self-pay | Admitting: Hematology and Oncology

## 2017-07-29 NOTE — Telephone Encounter (Signed)
Left message for patient regarding their appts that were added per 10/1 sch msg

## 2017-08-01 NOTE — H&P (Signed)
Subjective:     Patient ID: Misty Lynch is a 61 y.o. female.  HPI  Here for follow up dicussion breast reconstruction. Presented following screening MMG with two areas calcifications left breast, 7.1 cm apart. Biopsy left UOQ with ILC with LCIS. Biopsy left UIQ with ILC with ADH. Both ER/PR+, Her2-. [place] axilla negative.  Mastectomy recommended given multicentric disease, small breast size. Patient has elected for bilateral mastectomies.  Current 38 B, happy with this. Wt stable.  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 Husband is Chief Strategy Officer.   Review of Systems     Objective:   Physical Exam  Cardiovascular: Normal rate, regular rhythm and normal heart sounds.   Pulmonary/Chest: Effort normal and breath sounds normal.  Lymphadenopathy:    She has no axillary adenopathy.   Pseudoptosis to grade 1 bilat Left<right volume small amount Left breast with nevus sebaceous over lower pole breast   SN to nipple R 26 L 24.5  BW R 19 L 18 cm (CW 14) Nipple to IMF R 9 L 8 cm No palpable masses  Assessment:     Left breat ca UOQ ER+ (ILC) Left breast ca UIQ ER+ (ILC)    Plan:     Plan bilateral mastectomies with immediate expander acellular dermis reconstruction. Reviewed incisions, drains, OR length, hospital stay and recovery for each. Reviewed process of expansion and implant based risks including rupture, MRI surveillance for silicone implants, infection requiring surgery or removal, contracture.    Discussed use of acellular dermis in reconstruction, cadaveric source, incorporation over several weeks, risk that if has seroma or infection can act as additional nidus for infection if not incorporated.  Discussed future surgery dependent on adjuvant treatments.  Reviewed SSM vs NSM , both will be asensate and not stimulate. Reviewed with  risks mastectomy flap necrosis requiring additional surgery. Reviewed incisions and  scars for each. Will need to confirm with Dr. Excell Seltzer- patient is comfortable with either approach.  Reviewed reconstruction will be asensate and not stimulate. Reviewed additional risks including but not limited to risks mastectomy flap necrosis requiring additional surgery, seroma, hematoma, asymmetry, need to additional procedures, fat necrosis, DVT/PE, damage to adjacent structures, cardiopulmonary complications.  Rx for Norco, Robaxin, and Bactrim given.Rx for post mastectomy supplies given.   [name], MD [facility] [phone], pin [phone]

## 2017-08-19 ENCOUNTER — Encounter (HOSPITAL_BASED_OUTPATIENT_CLINIC_OR_DEPARTMENT_OTHER): Payer: Self-pay | Admitting: *Deleted

## 2017-08-21 ENCOUNTER — Encounter (HOSPITAL_BASED_OUTPATIENT_CLINIC_OR_DEPARTMENT_OTHER)
Admission: RE | Admit: 2017-08-21 | Discharge: 2017-08-21 | Disposition: A | Discharge status: Home / Self Care | Payer: 59 | Source: Ambulatory Visit | Attending: General Surgery | Admitting: General Surgery

## 2017-08-21 DIAGNOSIS — N6081 Other benign mammary dysplasias of right breast: Secondary | ICD-10-CM | POA: Diagnosis not present

## 2017-08-21 DIAGNOSIS — Z818 Family history of other mental and behavioral disorders: Secondary | ICD-10-CM | POA: Diagnosis not present

## 2017-08-21 DIAGNOSIS — D0502 Lobular carcinoma in situ of left breast: Secondary | ICD-10-CM | POA: Diagnosis not present

## 2017-08-21 DIAGNOSIS — Z833 Family history of diabetes mellitus: Secondary | ICD-10-CM | POA: Diagnosis not present

## 2017-08-21 DIAGNOSIS — Z79899 Other long term (current) drug therapy: Secondary | ICD-10-CM | POA: Diagnosis not present

## 2017-08-21 DIAGNOSIS — N6011 Diffuse cystic mastopathy of right breast: Secondary | ICD-10-CM | POA: Diagnosis not present

## 2017-08-21 DIAGNOSIS — Z9889 Other specified postprocedural states: Secondary | ICD-10-CM | POA: Diagnosis not present

## 2017-08-21 DIAGNOSIS — Z811 Family history of alcohol abuse and dependence: Secondary | ICD-10-CM | POA: Diagnosis not present

## 2017-08-21 DIAGNOSIS — Z823 Family history of stroke: Secondary | ICD-10-CM | POA: Diagnosis not present

## 2017-08-21 DIAGNOSIS — Z7982 Long term (current) use of aspirin: Secondary | ICD-10-CM | POA: Diagnosis not present

## 2017-08-21 DIAGNOSIS — D241 Benign neoplasm of right breast: Secondary | ICD-10-CM | POA: Diagnosis not present

## 2017-08-21 DIAGNOSIS — Z8261 Family history of arthritis: Secondary | ICD-10-CM | POA: Diagnosis not present

## 2017-08-21 DIAGNOSIS — I10 Essential (primary) hypertension: Secondary | ICD-10-CM | POA: Diagnosis not present

## 2017-08-21 DIAGNOSIS — Z881 Allergy status to other antibiotic agents status: Secondary | ICD-10-CM | POA: Diagnosis not present

## 2017-08-21 DIAGNOSIS — C50912 Malignant neoplasm of unspecified site of left female breast: Secondary | ICD-10-CM | POA: Diagnosis not present

## 2017-08-21 DIAGNOSIS — Z170 Estrogen receptor positive status [ER+]: Secondary | ICD-10-CM | POA: Diagnosis not present

## 2017-08-21 DIAGNOSIS — F419 Anxiety disorder, unspecified: Secondary | ICD-10-CM | POA: Diagnosis not present

## 2017-08-21 LAB — BASIC METABOLIC PANEL
ANION GAP: 7 (ref 5–15)
BUN: 13 mg/dL (ref 6–20)
CALCIUM: 9.4 mg/dL (ref 8.9–10.3)
CO2: 30 mmol/L (ref 22–32)
Chloride: 103 mmol/L (ref 101–111)
Creatinine, Ser: 0.77 mg/dL (ref 0.44–1.00)
Glucose, Bld: 114 mg/dL — ABNORMAL HIGH (ref 65–99)
POTASSIUM: 3.2 mmol/L — AB (ref 3.5–5.1)
SODIUM: 140 mmol/L (ref 135–145)

## 2017-08-21 NOTE — Progress Notes (Signed)
Dr. Royce Macadamia reviewed EKg - ok for surgery. Pt given Ensure and instructed to drink by 0400 day of surgery.

## 2017-08-22 ENCOUNTER — Ambulatory Visit: Payer: Self-pay | Admitting: General Surgery

## 2017-08-22 NOTE — H&P (Signed)
History of Present Illness Marland Kitchen T. Oluwadamilola Deliz MD; 07/26/2017 3:02 PM) The patient is a 61 year old female who presents with breast cancer. Patient returns for further treatment planning for her multicentric lobular carcinoma of the left breast. Her original presentation was as below:  She is a postmenopausal female referred by Dr. Lyn Hollingshead for evaluation of recently diagnosed carcinoma of the left breast. She recently presented for a 6 month follow-up for calcifications in the left breast. diagnostic mammogram February 2018 showed a 1.6 cm area of amorphous calcifications posteriorly in the medial left breast and a 2 mm punctate group in the lateral left breast. Subsequent recent follow-up diagnostic mammogram performed 06/24/2017 no change in the calcificheions but new subtle distortion associated with the lateral 2 mm group. A stereotactic guided breast biopsy was performed of both of these areas on 06/24/2017 with pathology revealing invasive lobular carcinoma and LCIS in the lateral biopsy and invasive lobular carcinoma and atypical ductal hyperplasia in the medial biopsy. She is seen now in breast multidisciplinary clinic for initial treatment planning. She has experienced no breast symptoms, specifically lump or skin change or nipple discharge. She does not have a personal history of any previous breast problems.  Findings at that time were the following: Tumor size: 1.6 and 0.2 cm Tumor grade: 1-2, Ki-67 2% Estrogen Receptor: positive Progesterone Receptor: positive Her-2 neu: negative Lymph node status: negative  Due to relatively small breast size and multicentric lobular carcinoma we had recommended total mastectomy. She has seen Dr. Iran Planas and desires immediate reconstruction. Due to multicentric lobular carcinoma with some increased risk of bilaterality the patient is strongly considering bilateral mastectomy.    Problem List/Past Medical Marland Kitchen T. Maki Sweetser, MD;  07/26/2017 3:02 PM) LOBULAR CARCINOMA OF LEFT BREAST, STAGE 1 (Z30.076)   Past Surgical History Marland Kitchen T. Tuck Dulworth, MD; 07/26/2017 3:02 PM) Breast Biopsy  Left. Oral Surgery   Diagnostic Studies History Marland Kitchen T. Aly Hauser, MD; 07/26/2017 3:02 PM) Colonoscopy  >10 years ago Mammogram  within last year Pap Smear  1-5 years ago  Allergies (Tanisha A. Owens Shark, [place]; 07/26/2017 2:25 PM) Tetracycline *CHEMICALS*  Itching. Allergies Reconciled   Medication History (Tanisha A. Owens Shark, [place]; 07/26/2017 2:25 PM) Aspirin ('81MG'$  Tablet DR, Oral) Active. Viactiv (226-333-54 Tablet Chewable, Oral) Active. TriLyte (420GM For Solution, Oral) Active. Losartan Potassium-HCTZ (100-'25MG'$  Tablet, Oral) Active. Effexor XR ('75MG'$  Capsule ER 24HR, Oral) Active. Medications Reconciled  Social History Marland Kitchen T. Jaggar Benko, MD; 07/26/2017 3:02 PM) Alcohol use  Occasional alcohol use. Caffeine use  Coffee, Tea. No drug use   Family History Marland Kitchen T. Khristi Schiller, MD; 07/26/2017 3:02 PM) Alcohol Abuse  Father. Arthritis  Family Members In General. Cancer  Family Members In General. Cerebrovascular Accident  Family Members In General. Depression  Sister. Diabetes Mellitus  Father, Sister. Hypertension  Father.  Pregnancy / Birth History Marland Kitchen T. Edrick Whitehorn, MD; 07/26/2017 3:02 PM) Age at menarche  63 years. Age of menopause  35-60 Contraceptive History  Oral contraceptives. Gravida  4 Irregular periods  Maternal age  82-25 Para  2  Other Problems Marland Kitchen T. Abdirahman Chittum, MD; 07/26/2017 3:02 PM) Anxiety Disorder  Back Pain  Breast Cancer  Heart murmur  Hemorrhoids  High blood pressure  Lump In Breast   Vitals (Tanisha A. Brown RMA; 07/26/2017 2:24 PM) 07/26/2017 2:24 PM Weight: 199.4 lb Height: 64.5in Body Surface Area: 1.96 m Body Mass Index: 33.7 kg/m  Temp.: 98.60F  Pulse: 76 (Regular)  BP: 124/78 (Sitting, Left Arm,  Standard)  Physical Exam Marland Kitchen T. Herman Mell MD; 07/26/2017 3:03 PM) The physical exam findings are as follows: Note:General: Alert, well-developed and well nourished Caucasian female, in no distress Skin: Warm and dry without rash or infection. HEENT: No palpable masses or thyromegaly. Sclera nonicteric. Pupils equal round and reactive. Lymph nodes: No cervical, supraclavicular, nodes palpable. Breasts: Slight bruising left breast post biopsy. No palpable masses. No significant crusting or skin changes or nipple inversion. Lungs: Breath sounds clear and equal. No wheezing or increased work of breathing. Cardiovascular: Regular rate and rhythm without murmer. No JVD or edema. Peripheral pulses intact. No carotid bruits. Extremities: No edema or joint swelling or deformity. No chronic venous stasis changes. Neurologic: Alert and fully oriented. Gait normal. No focal weakness. Psychiatric: Normal mood and affect. Thought content appropriate with normal judgement and insight    Assessment & Plan Marland Kitchen T. Keary Hanak MD; 07/26/2017 3:06 PM) LOBULAR CARCINOMA OF LEFT BREAST, STAGE 1 (C50.912) Impression: 61 year old female with a new diagnosis of invasive lobular cancer of the left breast, multicentric, upper outer and upper inner quadrant. Clinical stage 1 a, ER positive, PR positive, HER-2 negative. I have previously discussed with the patient and her husband initial surgical treatment options. We discussed options of breast conservation with lumpectomy Times 2 or total mastectomy and sentinal lymph node biopsy/dissection. Options for reconstruction were discussed. we discussed that she iue to an ideal candidate for breast conservation due to multicentric cancer but if she has 2 small cancers that double lumpectomy would potentially be an option with slobular nature and of positive margins due to the lobular nature and multicentric nature.  Patient has considered her options and desires  total mastectomy which I would believe would be the best course. We discussed options of nipple sparing and traditional skin sparing mastectomy. I believe she would be a good candidate for nipple sparing mastectomy and after comparing the options she is in agreement.  She has subsequently thought over her options and seeing plastic surgery and desires bilateral mastectomy which I think is very reasonable.  We discussed the indications and nature of the procedure, and expected recovery, in detail. Surgical risks including anesthetic complications, cardiorespiratory complications, bleeding, infection, wound healing complications, partial or complete nipple loss, blood clots, lymphedema, local and distant recurrence and possible need for further surgery based on the final pathology was discussed and understood. Chemotherapy, hormonal therapy and radiation therapy have been discussed. They have been provided with literature regarding the treatment of breast cancer.  Bilateral total nipple sparing mastectomy and immediate reconstruction with left axillary sentinel lymph node biopsy.

## 2017-08-22 NOTE — Progress Notes (Signed)
Lab results reviewed by Dr. Odonno, will proceed with surgery as scheduled. 

## 2017-08-22 NOTE — Progress Notes (Signed)
Spoke to [place] office staff re: Dr McKesson consent needs to be updated

## 2017-08-23 ENCOUNTER — Ambulatory Visit (HOSPITAL_BASED_OUTPATIENT_CLINIC_OR_DEPARTMENT_OTHER): Payer: 59 | Admitting: Anesthesiology

## 2017-08-23 ENCOUNTER — Encounter (HOSPITAL_BASED_OUTPATIENT_CLINIC_OR_DEPARTMENT_OTHER)
Admission: RE | Disposition: A | Discharge status: Home / Self Care | Payer: Self-pay | Source: Ambulatory Visit | Attending: General Surgery

## 2017-08-23 ENCOUNTER — Ambulatory Visit (HOSPITAL_BASED_OUTPATIENT_CLINIC_OR_DEPARTMENT_OTHER)
Admission: RE | Admit: 2017-08-23 | Discharge: 2017-08-24 | Disposition: A | Discharge status: Home / Self Care | Payer: 59 | Source: Ambulatory Visit | Attending: Plastic Surgery | Admitting: Plastic Surgery

## 2017-08-23 ENCOUNTER — Ambulatory Visit (HOSPITAL_COMMUNITY)
Admission: RE | Admit: 2017-08-23 | Discharge: 2017-08-23 | Disposition: A | Discharge status: Home / Self Care | Payer: 59 | Source: Ambulatory Visit | Attending: General Surgery | Admitting: General Surgery

## 2017-08-23 ENCOUNTER — Encounter (HOSPITAL_BASED_OUTPATIENT_CLINIC_OR_DEPARTMENT_OTHER): Payer: Self-pay | Admitting: Emergency Medicine

## 2017-08-23 DIAGNOSIS — Z881 Allergy status to other antibiotic agents status: Secondary | ICD-10-CM | POA: Insufficient documentation

## 2017-08-23 DIAGNOSIS — C50912 Malignant neoplasm of unspecified site of left female breast: Secondary | ICD-10-CM

## 2017-08-23 DIAGNOSIS — Z170 Estrogen receptor positive status [ER+]: Principal | ICD-10-CM

## 2017-08-23 DIAGNOSIS — D0502 Lobular carcinoma in situ of left breast: Secondary | ICD-10-CM | POA: Insufficient documentation

## 2017-08-23 DIAGNOSIS — N6011 Diffuse cystic mastopathy of right breast: Secondary | ICD-10-CM | POA: Insufficient documentation

## 2017-08-23 DIAGNOSIS — Z833 Family history of diabetes mellitus: Secondary | ICD-10-CM | POA: Insufficient documentation

## 2017-08-23 DIAGNOSIS — Z818 Family history of other mental and behavioral disorders: Secondary | ICD-10-CM | POA: Insufficient documentation

## 2017-08-23 DIAGNOSIS — F419 Anxiety disorder, unspecified: Secondary | ICD-10-CM | POA: Insufficient documentation

## 2017-08-23 DIAGNOSIS — Z811 Family history of alcohol abuse and dependence: Secondary | ICD-10-CM | POA: Insufficient documentation

## 2017-08-23 DIAGNOSIS — Z8261 Family history of arthritis: Secondary | ICD-10-CM | POA: Insufficient documentation

## 2017-08-23 DIAGNOSIS — Z79899 Other long term (current) drug therapy: Secondary | ICD-10-CM | POA: Insufficient documentation

## 2017-08-23 DIAGNOSIS — D241 Benign neoplasm of right breast: Secondary | ICD-10-CM | POA: Insufficient documentation

## 2017-08-23 DIAGNOSIS — I10 Essential (primary) hypertension: Secondary | ICD-10-CM | POA: Insufficient documentation

## 2017-08-23 DIAGNOSIS — N6081 Other benign mammary dysplasias of right breast: Secondary | ICD-10-CM | POA: Insufficient documentation

## 2017-08-23 DIAGNOSIS — Z7982 Long term (current) use of aspirin: Secondary | ICD-10-CM | POA: Insufficient documentation

## 2017-08-23 DIAGNOSIS — Z9889 Other specified postprocedural states: Secondary | ICD-10-CM | POA: Insufficient documentation

## 2017-08-23 DIAGNOSIS — Z823 Family history of stroke: Secondary | ICD-10-CM | POA: Insufficient documentation

## 2017-08-23 HISTORY — PX: BREAST RECONSTRUCTION WITH PLACEMENT OF TISSUE EXPANDER AND FLEX HD (ACELLULAR HYDRATED DERMIS): SHX6295

## 2017-08-23 HISTORY — PX: NIPPLE SPARING MASTECTOMY/SENTINAL LYMPH NODE BIOPSY/RECONSTRUCTION/PLACEMENT OF TISSUE EXPANDER: SHX6484

## 2017-08-23 HISTORY — DX: Anxiety disorder, unspecified: F41.9

## 2017-08-23 HISTORY — DX: Malignant (primary) neoplasm, unspecified: C80.1

## 2017-08-23 SURGERY — NIPPLE SPARING MASTECTOMY WITH SENTINAL LYMPH NODE BIOPSY AND  RECONSTRUCTION WITH PLACEMENT OF TISSUE EXPANDER
Anesthesia: General | Site: Breast | Laterality: Bilateral

## 2017-08-23 MED ORDER — GABAPENTIN 300 MG PO CAPS
ORAL_CAPSULE | ORAL | Status: AC
  Filled 2017-08-23: qty 1

## 2017-08-23 MED ORDER — METHOCARBAMOL 500 MG PO TABS
500.0000 mg | ORAL_TABLET | Freq: Four times a day (QID) | ORAL | Status: DC | PRN
  Administered 2017-08-23 – 2017-08-24 (×2): 500 mg via ORAL
  Filled 2017-08-23 (×2): qty 1

## 2017-08-23 MED ORDER — CEFAZOLIN SODIUM-DEXTROSE 2-4 GM/100ML-% IV SOLN
2.0000 g | INTRAVENOUS | Status: AC
  Administered 2017-08-23 (×2): 2 g via INTRAVENOUS

## 2017-08-23 MED ORDER — SULFAMETHOXAZOLE-TRIMETHOPRIM 800-160 MG PO TABS: 1.0000 | ORAL_TABLET | Freq: Two times a day (BID) | ORAL | 0 refills | Status: DC

## 2017-08-23 MED ORDER — FENTANYL CITRATE (PF) 100 MCG/2ML IJ SOLN
INTRAMUSCULAR | Status: AC
  Filled 2017-08-23: qty 2

## 2017-08-23 MED ORDER — PROPOFOL 500 MG/50ML IV EMUL
INTRAVENOUS | Status: AC
  Filled 2017-08-23: qty 50

## 2017-08-23 MED ORDER — HYDROMORPHONE HCL 1 MG/ML IJ SOLN: 0.5000 mg | INTRAMUSCULAR | Status: DC | PRN

## 2017-08-23 MED ORDER — OXYCODONE HCL 5 MG PO TABS: 5.0000 mg | ORAL_TABLET | ORAL | 0 refills | Status: DC | PRN

## 2017-08-23 MED ORDER — CEFAZOLIN SODIUM-DEXTROSE 2-4 GM/100ML-% IV SOLN
INTRAVENOUS | Status: AC
  Filled 2017-08-23: qty 100

## 2017-08-23 MED ORDER — METOCLOPRAMIDE HCL 5 MG/ML IJ SOLN: 10.0000 mg | Freq: Once | INTRAMUSCULAR | Status: DC | PRN

## 2017-08-23 MED ORDER — CELECOXIB 200 MG PO CAPS
200.0000 mg | ORAL_CAPSULE | ORAL | Status: AC
  Administered 2017-08-23: 200 mg via ORAL

## 2017-08-23 MED ORDER — CHLORHEXIDINE GLUCONATE CLOTH 2 % EX PADS: 6.0000 | MEDICATED_PAD | Freq: Once | CUTANEOUS | Status: DC

## 2017-08-23 MED ORDER — MIDAZOLAM HCL 2 MG/2ML IJ SOLN
INTRAMUSCULAR | Status: AC
  Filled 2017-08-23: qty 2

## 2017-08-23 MED ORDER — LIDOCAINE 2% (20 MG/ML) 5 ML SYRINGE
INTRAMUSCULAR | Status: AC
  Filled 2017-08-23: qty 5

## 2017-08-23 MED ORDER — LACTATED RINGERS IV SOLN
INTRAVENOUS | Status: DC
  Administered 2017-08-23 (×3): via INTRAVENOUS

## 2017-08-23 MED ORDER — FENTANYL CITRATE (PF) 100 MCG/2ML IJ SOLN
INTRAMUSCULAR | Status: DC | PRN
  Administered 2017-08-23 (×2): 100 ug via INTRAVENOUS

## 2017-08-23 MED ORDER — ACETAMINOPHEN 500 MG PO TABS
1000.0000 mg | ORAL_TABLET | ORAL | Status: AC
  Administered 2017-08-23: 1000 mg via ORAL

## 2017-08-23 MED ORDER — VENLAFAXINE HCL ER 75 MG PO CP24: 75.0000 mg | ORAL_CAPSULE | Freq: Every day | ORAL | Status: DC

## 2017-08-23 MED ORDER — SUGAMMADEX SODIUM 200 MG/2ML IV SOLN
INTRAVENOUS | Status: AC
  Filled 2017-08-23: qty 2

## 2017-08-23 MED ORDER — CEFAZOLIN SODIUM-DEXTROSE 1-4 GM/50ML-% IV SOLN
1.0000 g | Freq: Three times a day (TID) | INTRAVENOUS | Status: AC
  Administered 2017-08-23 – 2017-08-24 (×2): 1 g via INTRAVENOUS
  Filled 2017-08-23 (×2): qty 50

## 2017-08-23 MED ORDER — ONDANSETRON HCL 4 MG/2ML IJ SOLN: 4.0000 mg | Freq: Four times a day (QID) | INTRAMUSCULAR | Status: DC | PRN

## 2017-08-23 MED ORDER — TECHNETIUM TC 99M SULFUR COLLOID FILTERED
1.0000 | Freq: Once | INTRAVENOUS | Status: AC | PRN
  Administered 2017-08-23: 1 via INTRADERMAL

## 2017-08-23 MED ORDER — LACTATED RINGERS IV SOLN: INTRAVENOUS | Status: DC

## 2017-08-23 MED ORDER — MIDAZOLAM HCL 5 MG/5ML IJ SOLN
INTRAMUSCULAR | Status: DC | PRN
  Administered 2017-08-23: 2 mg via INTRAVENOUS

## 2017-08-23 MED ORDER — BUPIVACAINE-EPINEPHRINE (PF) 0.5% -1:200000 IJ SOLN
INTRAMUSCULAR | Status: AC
  Filled 2017-08-23: qty 30

## 2017-08-23 MED ORDER — GABAPENTIN 300 MG PO CAPS
300.0000 mg | ORAL_CAPSULE | ORAL | Status: AC
  Administered 2017-08-23: 300 mg via ORAL

## 2017-08-23 MED ORDER — ROCURONIUM BROMIDE 100 MG/10ML IV SOLN
INTRAVENOUS | Status: DC | PRN
  Administered 2017-08-23 (×2): 10 mg via INTRAVENOUS
  Administered 2017-08-23: 50 mg via INTRAVENOUS
  Administered 2017-08-23 (×2): 10 mg via INTRAVENOUS

## 2017-08-23 MED ORDER — BUPIVACAINE-EPINEPHRINE (PF) 0.25% -1:200000 IJ SOLN
INTRAMUSCULAR | Status: AC
  Filled 2017-08-23: qty 30

## 2017-08-23 MED ORDER — SODIUM CHLORIDE 0.9 % IJ SOLN
INTRAMUSCULAR | Status: AC
  Filled 2017-08-23: qty 10

## 2017-08-23 MED ORDER — FENTANYL CITRATE (PF) 100 MCG/2ML IJ SOLN: 25.0000 ug | INTRAMUSCULAR | Status: DC | PRN

## 2017-08-23 MED ORDER — GLYCOPYRROLATE 0.2 MG/ML IJ SOLN
INTRAMUSCULAR | Status: DC | PRN
  Administered 2017-08-23: 0.2 mg via INTRAVENOUS

## 2017-08-23 MED ORDER — KCL IN DEXTROSE-NACL 20-5-0.45 MEQ/L-%-% IV SOLN
INTRAVENOUS | Status: DC
  Administered 2017-08-23: 14:00:00 via INTRAVENOUS
  Filled 2017-08-23: qty 1000

## 2017-08-23 MED ORDER — BUPIVACAINE-EPINEPHRINE (PF) 0.25% -1:200000 IJ SOLN
INTRAMUSCULAR | Status: DC | PRN
  Administered 2017-08-23 (×2): 25 mL via PERINEURAL

## 2017-08-23 MED ORDER — SODIUM CHLORIDE 0.9 % IV SOLN
INTRAVENOUS | Status: DC | PRN
  Administered 2017-08-23: 09:00:00

## 2017-08-23 MED ORDER — PHENYLEPHRINE HCL 10 MG/ML IJ SOLN
INTRAMUSCULAR | Status: DC | PRN
  Administered 2017-08-23 (×7): 80 ug via INTRAVENOUS

## 2017-08-23 MED ORDER — ACETAMINOPHEN 500 MG PO TABS
ORAL_TABLET | ORAL | Status: AC
  Filled 2017-08-23: qty 2

## 2017-08-23 MED ORDER — ONDANSETRON 4 MG PO TBDP
4.0000 mg | ORAL_TABLET | Freq: Four times a day (QID) | ORAL | Status: DC | PRN
Start: 2017-08-23 — End: 2017-08-24

## 2017-08-23 MED ORDER — METHYLENE BLUE 0.5 % INJ SOLN
INTRAVENOUS | Status: AC
  Filled 2017-08-23: qty 10

## 2017-08-23 MED ORDER — CEFAZOLIN SODIUM 1 G IJ SOLR
INTRAMUSCULAR | Status: AC
  Filled 2017-08-23: qty 20

## 2017-08-23 MED ORDER — LIDOCAINE HCL (CARDIAC) 20 MG/ML IV SOLN
INTRAVENOUS | Status: DC | PRN
  Administered 2017-08-23: 60 mg via INTRAVENOUS

## 2017-08-23 MED ORDER — OXYCODONE HCL 5 MG PO TABS
5.0000 mg | ORAL_TABLET | ORAL | Status: DC | PRN
  Administered 2017-08-23: 5 mg via ORAL
  Administered 2017-08-24: 10 mg via ORAL
  Filled 2017-08-23 (×4): qty 1

## 2017-08-23 MED ORDER — FENTANYL CITRATE (PF) 100 MCG/2ML IJ SOLN
INTRAMUSCULAR | Status: AC
  Filled 2017-08-23: qty 4

## 2017-08-23 MED ORDER — FENTANYL CITRATE (PF) 100 MCG/2ML IJ SOLN
50.0000 ug | INTRAMUSCULAR | Status: DC | PRN
  Administered 2017-08-23: 100 ug via INTRAVENOUS

## 2017-08-23 MED ORDER — ROCURONIUM BROMIDE 10 MG/ML (PF) SYRINGE
PREFILLED_SYRINGE | INTRAVENOUS | Status: AC
  Filled 2017-08-23: qty 5

## 2017-08-23 MED ORDER — METHOCARBAMOL 500 MG PO TABS: 500.0000 mg | ORAL_TABLET | Freq: Three times a day (TID) | ORAL | 0 refills | Status: DC | PRN

## 2017-08-23 MED ORDER — DEXAMETHASONE SODIUM PHOSPHATE 10 MG/ML IJ SOLN
INTRAMUSCULAR | Status: DC | PRN
  Administered 2017-08-23: 5 mg via INTRAVENOUS

## 2017-08-23 MED ORDER — KETOROLAC TROMETHAMINE 15 MG/ML IJ SOLN
15.0000 mg | Freq: Three times a day (TID) | INTRAMUSCULAR | Status: AC
  Administered 2017-08-23 – 2017-08-24 (×3): 15 mg via INTRAVENOUS
  Filled 2017-08-23 (×3): qty 1

## 2017-08-23 MED ORDER — SCOPOLAMINE 1 MG/3DAYS TD PT72: 1.0000 | MEDICATED_PATCH | Freq: Once | TRANSDERMAL | Status: DC | PRN

## 2017-08-23 MED ORDER — CELECOXIB 200 MG PO CAPS
ORAL_CAPSULE | ORAL | Status: AC
  Filled 2017-08-23: qty 1

## 2017-08-23 MED ORDER — PROPOFOL 10 MG/ML IV BOLUS
INTRAVENOUS | Status: DC | PRN
  Administered 2017-08-23: 200 mg via INTRAVENOUS

## 2017-08-23 MED ORDER — SUGAMMADEX SODIUM 200 MG/2ML IV SOLN
INTRAVENOUS | Status: DC | PRN
  Administered 2017-08-23: 200 mg via INTRAVENOUS

## 2017-08-23 MED ORDER — MIDAZOLAM HCL 2 MG/2ML IJ SOLN
1.0000 mg | INTRAMUSCULAR | Status: DC | PRN
  Administered 2017-08-23: 2 mg via INTRAVENOUS

## 2017-08-23 MED ORDER — LOSARTAN POTASSIUM-HCTZ 100-25 MG PO TABS: 1.0000 | ORAL_TABLET | Freq: Every day | ORAL | Status: DC

## 2017-08-23 MED ORDER — MEPERIDINE HCL 25 MG/ML IJ SOLN: 6.2500 mg | INTRAMUSCULAR | Status: DC | PRN

## 2017-08-23 MED ORDER — ONDANSETRON HCL 4 MG/2ML IJ SOLN
INTRAMUSCULAR | Status: DC | PRN
  Administered 2017-08-23: 4 mg via INTRAVENOUS

## 2017-08-23 SURGICAL SUPPLY — 111 items
ADH SKN CLS APL DERMABOND .7 (GAUZE/BANDAGES/DRESSINGS) ×4
ALLODERM 8X16 MED THICK (Tissue) ×8 IMPLANT
APL SKNCLS STERI-STRIP NONHPOA (GAUZE/BANDAGES/DRESSINGS)
APPLIER CLIP 11 MED OPEN (CLIP) ×4
APPLIER CLIP 9.375 MED OPEN (MISCELLANEOUS)
APR CLP MED 11 20 MLT OPN (CLIP) ×2
APR CLP MED 9.3 20 MLT OPN (MISCELLANEOUS)
BAG DECANTER FOR FLEXI CONT (MISCELLANEOUS) ×4 IMPLANT
BENZOIN TINCTURE PRP APPL 2/3 (GAUZE/BANDAGES/DRESSINGS) IMPLANT
BINDER BREAST LRG (GAUZE/BANDAGES/DRESSINGS) IMPLANT
BINDER BREAST MEDIUM (GAUZE/BANDAGES/DRESSINGS) IMPLANT
BINDER BREAST XLRG (GAUZE/BANDAGES/DRESSINGS) IMPLANT
BINDER BREAST XXLRG (GAUZE/BANDAGES/DRESSINGS) IMPLANT
BLADE CLIPPER SURG (BLADE) IMPLANT
BLADE HEX COATED 2.75 (ELECTRODE) ×4 IMPLANT
BLADE SURG 10 STRL SS (BLADE) ×6 IMPLANT
BLADE SURG 15 STRL LF DISP TIS (BLADE) ×2 IMPLANT
BLADE SURG 15 STRL SS (BLADE) ×8
BNDG GAUZE ELAST 4 BULKY (GAUZE/BANDAGES/DRESSINGS) ×8 IMPLANT
CANISTER SUCT 1200ML W/VALVE (MISCELLANEOUS) ×4 IMPLANT
CHLORAPREP W/TINT 26ML (MISCELLANEOUS) ×6 IMPLANT
CLIP APPLIE 11 MED OPEN (CLIP) ×1 IMPLANT
CLIP APPLIE 9.375 MED OPEN (MISCELLANEOUS) IMPLANT
CLOSURE WOUND 1/2 X4 (GAUZE/BANDAGES/DRESSINGS)
COVER BACK TABLE 60X90IN (DRAPES) ×4 IMPLANT
COVER MAYO STAND STRL (DRAPES) ×4 IMPLANT
COVER PROBE W GEL 5X96 (DRAPES) ×4 IMPLANT
DECANTER SPIKE VIAL GLASS SM (MISCELLANEOUS) ×2 IMPLANT
DERMABOND ADVANCED (GAUZE/BANDAGES/DRESSINGS) ×4
DERMABOND ADVANCED .7 DNX12 (GAUZE/BANDAGES/DRESSINGS) ×3 IMPLANT
DEVICE DISSECT PLASMABLAD 3.0S (MISCELLANEOUS) ×2 IMPLANT
DEVICE DUBIN W/COMP PLATE 8390 (MISCELLANEOUS) ×2 IMPLANT
DRAIN CHANNEL 15F RND FF W/TCR (WOUND CARE) ×2 IMPLANT
DRAIN CHANNEL 19F RND (DRAIN) ×6 IMPLANT
DRAIN HEMOVAC 1/8 X 5 (WOUND CARE) IMPLANT
DRAPE LAPAROSCOPIC ABDOMINAL (DRAPES) IMPLANT
DRAPE SURG 17X23 STRL (DRAPES) ×8 IMPLANT
DRAPE TOP ARMCOVERS (MISCELLANEOUS) ×4 IMPLANT
DRAPE U-SHAPE 76X120 STRL (DRAPES) ×4 IMPLANT
DRAPE UTILITY XL STRL (DRAPES) ×4 IMPLANT
DRSG PAD ABDOMINAL 8X10 ST (GAUZE/BANDAGES/DRESSINGS) ×14 IMPLANT
DRSG TEGADERM 4X10 (GAUZE/BANDAGES/DRESSINGS) IMPLANT
DRSG TEGADERM 4X4.75 (GAUZE/BANDAGES/DRESSINGS) IMPLANT
ELECT BLADE 4.0 EZ CLEAN MEGAD (MISCELLANEOUS) ×4
ELECT COATED BLADE 2.86 ST (ELECTRODE) ×4 IMPLANT
ELECT REM PT RETURN 9FT ADLT (ELECTROSURGICAL) ×8
ELECTRODE BLDE 4.0 EZ CLN MEGD (MISCELLANEOUS) ×2 IMPLANT
ELECTRODE REM PT RTRN 9FT ADLT (ELECTROSURGICAL) ×2 IMPLANT
EVACUATOR SILICONE 100CC (DRAIN) ×7 IMPLANT
EXPANDER TISSUE MX 500CC (Prosthesis & Implant Plastic) IMPLANT
GAUZE SPONGE 4X4 12PLY STRL (GAUZE/BANDAGES/DRESSINGS) ×4 IMPLANT
GAUZE VASELINE 3X9 (GAUZE/BANDAGES/DRESSINGS) ×1 IMPLANT
GLOVE BIO SURGEON STRL SZ 6 (GLOVE) ×10 IMPLANT
GLOVE BIOGEL PI IND STRL 6.5 (GLOVE) IMPLANT
GLOVE BIOGEL PI IND STRL 7.0 (GLOVE) ×3 IMPLANT
GLOVE BIOGEL PI IND STRL 8 (GLOVE) ×2 IMPLANT
GLOVE BIOGEL PI INDICATOR 6.5 (GLOVE) ×6
GLOVE BIOGEL PI INDICATOR 7.0 (GLOVE) ×6
GLOVE BIOGEL PI INDICATOR 8 (GLOVE) ×2
GLOVE ECLIPSE 7.5 STRL STRAW (GLOVE) ×4 IMPLANT
GOWN STRL REUS W/ TWL LRG LVL3 (GOWN DISPOSABLE) ×4 IMPLANT
GOWN STRL REUS W/ TWL XL LVL3 (GOWN DISPOSABLE) ×4 IMPLANT
GOWN STRL REUS W/TWL LRG LVL3 (GOWN DISPOSABLE) ×8
GOWN STRL REUS W/TWL XL LVL3 (GOWN DISPOSABLE) ×12
IV NS 500ML (IV SOLUTION) ×4
IV NS 500ML BAXH (IV SOLUTION) ×2 IMPLANT
KIT FILL SYSTEM UNIVERSAL (SET/KITS/TRAYS/PACK) ×6 IMPLANT
LIGHT WAVEGUIDE WIDE FLAT (MISCELLANEOUS) IMPLANT
MARKER SKIN DUAL TIP RULER LAB (MISCELLANEOUS) IMPLANT
NDL HYPO 25X1 1.5 SAFETY (NEEDLE) ×4 IMPLANT
NDL SAFETY ECLIPSE 18X1.5 (NEEDLE) ×2 IMPLANT
NEEDLE HYPO 18GX1.5 SHARP (NEEDLE) ×4
NEEDLE HYPO 25X1 1.5 SAFETY (NEEDLE) ×8 IMPLANT
NS IRRIG 1000ML POUR BTL (IV SOLUTION) ×10 IMPLANT
PACK BASIN DAY SURGERY FS (CUSTOM PROCEDURE TRAY) ×4 IMPLANT
PENCIL BUTTON HOLSTER BLD 10FT (ELECTRODE) ×4 IMPLANT
PIN SAFETY STERILE (MISCELLANEOUS) ×4 IMPLANT
PLASMABLADE 3.0S (MISCELLANEOUS) ×4
PUNCH BIOPSY DERMAL 4MM (MISCELLANEOUS) ×3 IMPLANT
SHEET MEDIUM DRAPE 40X70 STRL (DRAPES) ×4 IMPLANT
SLEEVE SCD COMPRESS KNEE MED (MISCELLANEOUS) ×4 IMPLANT
SPONGE LAP 18X18 X RAY DECT (DISPOSABLE) ×12 IMPLANT
SPONGE LAP 4X18 X RAY DECT (DISPOSABLE) IMPLANT
STAPLER VISISTAT 35W (STAPLE) ×4 IMPLANT
STRIP CLOSURE SKIN 1/2X4 (GAUZE/BANDAGES/DRESSINGS) IMPLANT
SUT CHROMIC 4 0 PS 2 18 (SUTURE) IMPLANT
SUT ETHILON 2 0 FS 18 (SUTURE) ×4 IMPLANT
SUT ETHILON 3 0 PS 1 (SUTURE) ×4 IMPLANT
SUT MNCRL AB 4-0 PS2 18 (SUTURE) ×8 IMPLANT
SUT MON AB 4-0 PC3 18 (SUTURE) ×4 IMPLANT
SUT PDS AB 2-0 CT2 27 (SUTURE) IMPLANT
SUT SILK 2 0 SH (SUTURE) IMPLANT
SUT VIC AB 0 CT1 27 (SUTURE)
SUT VIC AB 0 CT1 27XBRD ANBCTR (SUTURE) IMPLANT
SUT VIC AB 3-0 SH 27 (SUTURE) ×8
SUT VIC AB 3-0 SH 27X BRD (SUTURE) ×3 IMPLANT
SUT VICRYL 0 CT-2 (SUTURE) IMPLANT
SUT VICRYL 3-0 CR8 SH (SUTURE) ×3 IMPLANT
SUT VICRYL 4-0 PS2 18IN ABS (SUTURE) ×8 IMPLANT
SUT VLOC 180 0 24IN GS25 (SUTURE) IMPLANT
SYR BULB IRRIGATION 50ML (SYRINGE) ×4 IMPLANT
SYR CONTROL 10ML LL (SYRINGE) ×6 IMPLANT
TAPE MEASURE VINYL STERILE (MISCELLANEOUS) ×1 IMPLANT
TISSUE ALLDRM 8X16 MED THICK (Tissue) IMPLANT
TISSUE EXPANDER MX 500CC (Prosthesis & Implant Plastic) ×8 IMPLANT
TOWEL OR 17X24 6PK STRL BLUE (TOWEL DISPOSABLE) ×8 IMPLANT
TOWEL OR NON WOVEN STRL DISP B (DISPOSABLE) ×4 IMPLANT
TUBE CONNECTING 20'X1/4 (TUBING) ×4
TUBE CONNECTING 20X1/4 (TUBING) ×6 IMPLANT
UNDERPAD 30X30 (UNDERPADS AND DIAPERS) ×8 IMPLANT
YANKAUER SUCT BULB TIP NO VENT (SUCTIONS) ×10 IMPLANT

## 2017-08-23 NOTE — Anesthesia Procedure Notes (Signed)
Anesthesia Regional Block: Pectoralis block   Pre-Anesthetic Checklist: ,, timeout performed, Correct Patient, Correct Site, Correct Laterality, Correct Procedure, Correct Position, site marked, Risks and benefits discussed,  Surgical consent,  Pre-op evaluation,  At surgeon's request and post-op pain management  Laterality: Left  Prep: Maximum Sterile Barrier Precautions used, chloraprep       Needles:  Injection technique: Single-shot  Needle Type: Echogenic Stimulator Needle     Needle Length: 10cm      Additional Needles:   Procedures:,,,, ultrasound used (permanent image in chart),,,,  Narrative:  Start time: 08/23/2017 7:00 AM End time: 08/23/2017 7:07 AM Injection made incrementally with aspirations every 5 mL.  Performed by: Personally  Anesthesiologist: Montez Hageman  Additional Notes: Risks, benefits and alternative to block explained extensively.  Patient tolerated procedure well, without complications.

## 2017-08-23 NOTE — Op Note (Signed)
Operative Note   DATE OF OPERATION: 10.26.18  LOCATION: [facility]-observation  SURGICAL DIVISION: Plastic Surgery  PREOPERATIVE DIAGNOSES:  1. Left breast cancer   POSTOPERATIVE DIAGNOSES:  same  PROCEDURE:  1. Bilateral breast reconstruction with tissue expanders 2. Acellular dermis (Alloderm) for breast reconstruction 200cm2  SURGEON: [name] MD MBA  ASSISTANT: non  ANESTHESIA:  General.   EBL: 500 ml for entire procedure  COMPLICATIONS: None immediate.   INDICATIONS FOR PROCEDURE:  The patient, Misty Lynch, is a 61 y.o. female born on 10/31/1955, is here for immediate expander acellular dermis reconstruction following bilateral nipple sparing mastectomies.    FINDINGS: Natrelle 133MX-13 T  500 ml tissue expanders placed bilateral, initial fill volume 240 ml. RIGHT SN [id number] LEFT SN [id number]  DESCRIPTION OF PROCEDURE:  The patient was marked with the patient in the preoperative area to mark sternal notch, chest midline, anterior axillary lines and inframammary folds. The patient was taken to the operating room. SCDs were placed and IV antibiotics were given. Foley catheter placed. The patient's operative site was prepped and draped in a sterile fashion. A time out was performed and all information was confirmed to be correct. Following completion of mastectomies, reconstruction began on right side.  Lower border pectoralis muscle identified and dissected free from overlying skin flap in limited fashion. Submuscular dissection then completed to accommodate the expander. Acellular dermis prepared and perforated. This was inset to chest wall with running 3-0 vicryl. The cavity was irrigated with Ancef, gentamicin, bacitracin solution and hemostasis ensured. 19Fr JP placed in cavity and secured with 2-0 nylon. The cavity was then irrigated with Betadine. Expander prepared and placed in submuscular cavity. This was secured to chest wall with 3-0 vicryl. Acellular  dermis was then wrapped over lower border expander and secured to lateral pectoralis muscle with 3-0 vicryl. Laterally the mastectomy flap over posterior axillary line was advanced anteriorly and the subcutaneous tissue and superficial fascia was secured to pectoralis muscle and acellular dermis with 0-vicryl. Skin closure completedwith 3-0 vicryl in fascial layer and 4-0 vicryl in dermis. Skin closure completed with 4-0 monocryl subcuticular and tissue adhesive.  I then directed my attention to left chest where similar submuscular dissection completed. There perforated ADM was then secured to chestwall with running 3-0 vicryl. The cavity was irrigated with Ancef, gentamicin, bacitracin solution and hemostasis ensured. 19Fr JP placed in cavity and secured with 2-0 nylon. The cavity was then irrigated with Betadine. The expander was placed in submuscular position and secured to chest wall with 3-0 vicryl. The ADM was redraped over expander and trimmed. It was sutured to lower border pectoralis with 3-0 vicryl. Laterally the mastectomy flap over posterior axillary line was advanced anteriorly and the subcutaneous tissue and superficial fascia was secured to pectoralis muscle and acellular dermis with 0-vicryl. Skin closure completedwith 3-0 vicryl in fascial layer and 4-0 vicryl in dermis. Skin closure completed with 4-0 monocryl subcuticular and tissue adhesive. The ports were accessed and filled to 180 ml of saline on each side. Tissue adhesive applied. The patient was brought to upright sitting position and nipple areolar complex was positioned symmetric from sternal notch and chest midline. Tegaderms applied. Patient returned to supine position.Dry dressing and breast binder applied.  The patient was allowed to wake from anesthesia, extubated and taken to the recovery room in satisfactory condition.   SPECIMENS: none  DRAINS: 19 Fr JP in right and left breast reconstruction  [name], MD  [facility]astic [facility] Reconstruct[facility] Surgery [phone],  pin [phone]

## 2017-08-23 NOTE — Anesthesia Procedure Notes (Signed)
Anesthesia Regional Block: Pectoralis block   Pre-Anesthetic Checklist: ,, timeout performed, Correct Patient, Correct Site, Correct Laterality, Correct Procedure, Correct Position, site marked, Risks and benefits discussed,  Surgical consent,  Pre-op evaluation,  At surgeon's request and post-op pain management  Laterality: Right  Prep: Maximum Sterile Barrier Precautions used, chloraprep       Needles:  Injection technique: Single-shot  Needle Type: Echogenic Stimulator Needle     Needle Length: 10cm      Additional Needles:   Procedures:,,,, ultrasound used (permanent image in chart),,,,  Narrative:  Start time: 08/23/2017 7:08 AM End time: 08/23/2017 7:16 AM Injection made incrementally with aspirations every 5 mL.  Performed by: Personally  Anesthesiologist: Montez Hageman  Additional Notes: Risks, benefits and alternative to block explained extensively.  Patient tolerated procedure well, without complications.

## 2017-08-23 NOTE — Progress Notes (Signed)
Nuclear medicine at bedside.  Patient is A & O x4.  She understood nuclear medicine procedure.  Remained at bedside to provide comfort to patient.

## 2017-08-23 NOTE — Anesthesia Postprocedure Evaluation (Signed)
Anesthesia Post Note  Patient: Misty Lynch  Procedure(s) Performed: BILATERAL NIPPLE SPARING MASTECTOMIES WITH LEFT SENTINEL LYMPH NODE BIOPSY (Bilateral Breast) BREAST RECONSTRUCTION WITH PLACEMENT OF TISSUE EXPANDER AND Alloderm (ACELLULAR HYDRATED DERMIS) (Bilateral )     Patient location during evaluation: PACU Anesthesia Type: General Level of consciousness: awake and alert Pain management: pain level controlled Vital Signs Assessment: post-procedure vital signs reviewed and stable Respiratory status: spontaneous breathing, nonlabored ventilation and respiratory function stable Cardiovascular status: blood pressure returned to baseline and stable Postop Assessment: no apparent nausea or vomiting Anesthetic complications: no    Last Vitals:  Vitals:   08/23/17 1345 08/23/17 1400  BP: (!) 142/73 125/78  Pulse: 71 72  Resp: (!) 0 17  Temp:    SpO2: 96% 96%    Last Pain:  Vitals:   08/23/17 1400  TempSrc:   PainSc: [place]

## 2017-08-23 NOTE — Anesthesia Preprocedure Evaluation (Addendum)
Anesthesia Evaluation  Patient identified by MRN, date of birth, ID band Patient awake    Reviewed: Allergy & Precautions, NPO status , Patient's Chart, lab work & pertinent test results  Airway Mallampati: II  TM Distance: >3 FB Neck ROM: Full    Dental no notable dental hx.    Pulmonary neg pulmonary ROS,    Pulmonary exam normal breath sounds clear to auscultation       Cardiovascular hypertension, Pt. on medications Normal cardiovascular exam Rhythm:Regular Rate:Normal     Neuro/Psych negative neurological ROS  negative psych ROS   GI/Hepatic negative GI ROS, Neg liver ROS,   Endo/Other  negative endocrine ROS  Renal/GU negative Renal ROS  negative genitourinary   Musculoskeletal negative musculoskeletal ROS (+)   Abdominal   Peds negative pediatric ROS (+)  Hematology negative hematology ROS (+)   Anesthesia Other Findings   Reproductive/Obstetrics negative OB ROS                             Anesthesia Physical Anesthesia Plan  ASA: II  Anesthesia Plan: General   Post-op Pain Management:  Regional for Post-op pain   Induction: Intravenous  PONV Risk Score and Plan: 3 and Ondansetron, Dexamethasone, Midazolam and Treatment may vary due to age or medical condition  Airway Management Planned: LMA and Oral ETT  Additional Equipment:   Intra-op Plan:   Post-operative Plan: Extubation in OR  Informed Consent: I have reviewed the patients History and Physical, chart, labs and discussed the procedure including the risks, benefits and alternatives for the proposed anesthesia with the patient or authorized representative who has indicated his/her understanding and acceptance.   Dental advisory given  Plan Discussed with: CRNA  Anesthesia Plan Comments:        Anesthesia Quick Evaluation

## 2017-08-23 NOTE — Progress Notes (Signed)
Assisted Dr. Marcell Barlow with right, left, axillary block. Side rails up, monitors on throughout procedure. See vital signs in flow sheet. Tolerated Procedure well.

## 2017-08-23 NOTE — Interval H&P Note (Signed)
History and Physical Interval Note:  08/23/2017 7:00 AM  Misty Lynch  has presented today for surgery, with the diagnosis of LEFT BREAST CANCER  The various methods of treatment have been discussed with the patient and family. After consideration of risks, benefits and other options for treatment, the patient has consented to  Procedure(s): BILATERAL TOTAL MASTECTOMIES WITH LEFT SENTINEL LYMPH NODE BIOPSY (Bilateral) BREAST RECONSTRUCTION WITH PLACEMENT OF TISSUE EXPANDER AND Alloderm (ACELLULAR HYDRATED DERMIS) (Bilateral) as a surgical intervention .  The patient's history has been reviewed, patient examined, no change in status, stable for surgery.  I have reviewed the patient's chart and labs.  Questions were answered to the patient's satisfaction.     Malikah Principato

## 2017-08-23 NOTE — Transfer of Care (Signed)
Immediate Anesthesia Transfer of Care Note  Patient: COLENA KETTERMAN  Procedure(s) Performed: BILATERAL NIPPLE SPARING MASTECTOMIES WITH LEFT SENTINEL LYMPH NODE BIOPSY (Bilateral Breast) BREAST RECONSTRUCTION WITH PLACEMENT OF TISSUE EXPANDER AND Alloderm (ACELLULAR HYDRATED DERMIS) (Bilateral )  Patient Location: PACU  Anesthesia Type:General  Level of Consciousness: awake, alert  and oriented  Airway & Oxygen Therapy: Patient Spontanous Breathing and Patient connected to face mask oxygen  Post-op Assessment: Report given to RN and Post -op Vital signs reviewed and stable  Post vital signs: Reviewed and stable  Last Vitals:  Vitals:   08/23/17 0735 08/23/17 1309  BP: (!) 158/61   Pulse: (!) 103 75  Resp: 15   Temp:    SpO2: 100% 99%    Last Pain:  Vitals:   08/23/17 0735  TempSrc:   PainSc: 0-No pain         Complications: No anesthesia complications

## 2017-08-23 NOTE — Op Note (Signed)
Preoperative Diagnosis: LEFT BREAST CANCER  Postoprative Diagnosis: LEFT BREAST CANCER  Procedure: Procedure(s): BILATERAL NIPPLE SPARING MASTECTOMIES WITH LEFT SENTINEL LYMPH NODE BIOPSY   Surgeon: Excell Seltzer T   Assistants: [name]  Anesthesia:  General endotracheal anesthesia  Indications: Patient is a 61 year old female with a recent diagnosis of multicentric invasive lobular carcinoma of the left breast clinically node negative.  After extensive preoperative workup and discussion detailed elsewhere we have elected to proceed with bilateral nipple sparing mastectomy and left axillary sentinel lymph node as initial surgical treatment.    Procedure Detail: Preoperatively the patient underwent injection of 1 mCi of technetium sulfur colloid intradermally around the left nipple in the holding area.  She underwent a bilateral pectoral block by anesthesia.  She was taken to the operating room, placed in supine position on the operating table, and general endotracheal anesthesia induced.  The entire anterior chest and abdomen, axilla and upper arms were widely sterilely prepped and draped.  She received preoperative IV antibiotics.  Patient timeout was performed and correct procedure verified.  The right side was approached initially.  An inframammary incision in the crease was used beginning just medial to the level of the areola and extending out laterally.  Using the plasma blade dissection was carried down to the chest wall and then the breast was elevated with the plasma blade off of the pectoralis fascia working superiorly and medially and laterally taking this dissection out to the extent of the edge of the breast tissue medially and superiorly and out laterally off the edge of the pectoralis major and off the lower portion of the latissimus.  Following that skin and subcutaneous flaps were developed with the plasma blade superiorly medially and laterally working up toward the  nipple.  I then created subcutaneous tunnel is progressively larger with Hegar dilators out to the planned extent of the dissection.  The nipple areolar complex was dissected up off the breast tissue and using the plasma blade as well as the tenotomy scissors the skin and subcutaneous flap was developed to the upper and medial extent of the breast tissue and laterally out toward the latissimus.  The dissection was deepened down to the chest wall in all directions and the breast was separated medially and superiorly as well as inferolaterally off the latissimus working up toward the axilla until the specimen was completely freed to the axillary contents.  I then came across the low axilla with the plasma blade and the specimen was removed.  It was oriented with sutures.  A biopsy from the posterior aspect of the nipple was taken sharply and sent as a separate specimen.  The wound was irrigated and complete hemostasis obtained.  The skin flap appeared healthy.  Moist saline gauze was placed and attention was turned to the left side.  An identical incision was made extending a little more laterally to allow better access to the axilla and the breast was dissected initially up off the chest wall and then skin and subcutaneous flap was created in an identical fashion in the breast mobilized in an identical fashion down to the axillary tail.  At this point the neoprobe was used to identify several hot areas in the axilla and a total of 4 sentinel nodes with distinctly elevated counts up as high as 1000 were identified and dissected free with the plasma blade and removed.  To these were slightly large but not obviously abnormal.  At this point background counts in the axilla were around  25 and there was no palpable adenopathy.  I then came across the low axilla with the plasma blade and the specimen was oriented identically to the opposite side.  I did not grossly see any tumor.  A nipple biopsy was taken from the  posterior base of the left nipple and sent separately.  Complete hemostasis was assured and then Dr. Iran Planas proceeded with reconstruction.    Findings: As above  Estimated Blood Loss:  200 mL         Drains: Per Dr. Iran Planas  Blood Given: none          Specimens: #1 right total mastectomy #2 right nipple biopsy   #3 left total mastectomy   for left nipple biopsy   #5 left axillary sentinel lymph nodes X 4        Complications:  * No complications entered in OR log *         Disposition: PACU - hemodynamically stable.         Condition: stable

## 2017-08-23 NOTE — Interval H&P Note (Signed)
History and Physical Interval Note:  08/23/2017 7:28 AM  Misty Lynch  has presented today for surgery, with the diagnosis of LEFT BREAST CANCER  The various methods of treatment have been discussed with the patient and family. After consideration of risks, benefits and other options for treatment, the patient has consented to  Procedure(s): BILATERAL TOTAL MASTECTOMIES WITH LEFT SENTINEL LYMPH NODE BIOPSY (Bilateral) BREAST RECONSTRUCTION WITH PLACEMENT OF TISSUE EXPANDER AND Alloderm (ACELLULAR HYDRATED DERMIS) (Bilateral) as a surgical intervention .  The patient's history has been reviewed, patient examined, no change in status, stable for surgery.  I have reviewed the patient's chart and labs.  Questions were answered to the patient's satisfaction.     Kallum Jorgensen T

## 2017-08-23 NOTE — Anesthesia Procedure Notes (Signed)
Procedure Name: Intubation Date/Time: 08/23/2017 7:59 AM Performed by: Jonna Munro Pre-anesthesia Checklist: Patient identified, Emergency Drugs available, Suction available, Patient being monitored and Timeout performed Patient Re-evaluated:Patient Re-evaluated prior to induction Oxygen Delivery Method: Circle system utilized Preoxygenation: Pre-oxygenation with 100% oxygen Induction Type: IV induction Ventilation: Mask ventilation without difficulty Laryngoscope Size: Mac and 3 Grade View: Grade I Tube type: Oral Tube size: 7.0 mm Number of attempts: 1 Airway Equipment and Method: Stylet Placement Confirmation: ETT inserted through vocal cords under direct vision,  positive ETCO2 and breath sounds checked- equal and bilateral Secured at: 22 cm Tube secured with: Tape Dental Injury: Teeth and Oropharynx as per pre-operative assessment

## 2017-08-24 DIAGNOSIS — C50912 Malignant neoplasm of unspecified site of left female breast: Secondary | ICD-10-CM | POA: Diagnosis not present

## 2017-08-24 MED ORDER — OXYCODONE HCL 5 MG PO TABS: 5.0000 mg | ORAL_TABLET | ORAL | 0 refills | Status: DC | PRN

## 2017-08-24 MED ORDER — METHOCARBAMOL 500 MG PO TABS: 500.0000 mg | ORAL_TABLET | Freq: Three times a day (TID) | ORAL | 0 refills | Status: DC | PRN

## 2017-08-24 MED ORDER — SULFAMETHOXAZOLE-TRIMETHOPRIM 800-160 MG PO TABS: 1.0000 | ORAL_TABLET | Freq: Two times a day (BID) | ORAL | 0 refills | Status: DC

## 2017-08-24 NOTE — Discharge Instructions (Signed)

## 2017-08-24 NOTE — Discharge Summary (Signed)
  POD# 1 bilateral NSM with left SLN, TE/ADM   Temp:  [98.2 F (36.8 C)-99.3 F (37.4 C)] 98.2 F (36.8 C) (10/27 0500) Pulse Rate:  [61-77] 61 (10/27 0500) Resp:  [0-20] 16 (10/27 0500) BP: (105-157)/(63-78) 105/65 (10/27 0500) SpO2:  [95 %-100 %] 99 % (10/27 0500)   JP 70/75  Tolerating diet pain controlled On exam Tegaderms in place, developing ecchymoses bilateral, chest soft  Home today. Instructed on drain and incision care. F/u arranged.  [name], MD [facility] [phone], pin [phone]

## 2017-08-27 ENCOUNTER — Encounter (HOSPITAL_BASED_OUTPATIENT_CLINIC_OR_DEPARTMENT_OTHER): Payer: Self-pay | Admitting: General Surgery

## 2017-09-02 ENCOUNTER — Telehealth: Payer: Self-pay | Admitting: Hematology and Oncology

## 2017-09-02 ENCOUNTER — Encounter: Payer: Self-pay | Admitting: *Deleted

## 2017-09-02 ENCOUNTER — Ambulatory Visit: Payer: 59 | Admitting: Hematology and Oncology

## 2017-09-02 DIAGNOSIS — Z170 Estrogen receptor positive status [ER+]: Secondary | ICD-10-CM | POA: Diagnosis not present

## 2017-09-02 DIAGNOSIS — C50412 Malignant neoplasm of upper-outer quadrant of left female breast: Secondary | ICD-10-CM

## 2017-09-02 MED ORDER — ANASTROZOLE 1 MG PO TABS: 1.0000 mg | ORAL_TABLET | Freq: Every day | ORAL | 3 refills | Status: DC

## 2017-09-02 NOTE — Progress Notes (Signed)
Patient Care Team: Kathyrn Lass, MD as PCP - General (Family Medicine) Excell Seltzer, MD as Consulting Physician (General Surgery) Nicholas Lose, MD as Consulting Physician (Hematology and Oncology) Kyung Rudd, MD as Consulting Physician (Radiation Oncology)  DIAGNOSIS:  Encounter Diagnosis  Name Primary?  . Malignant neoplasm of upper-outer quadrant of left breast in female, estrogen receptor positive ([place])     SUMMARY OF ONCOLOGIC HISTORY:   Malignant neoplasm of upper-outer quadrant of left breast in female, estrogen receptor positive ([place])   06/24/2017 Initial Diagnosis    Left breast biopsy upper outer quadrant: Invasive lobular cancer with LCIS; upper inner quadrant: Invasive lobular cancer with ADH, grade 1-2, ER 40%, PR 90%, Ki-67 2%, HER-2 negative ratio 1.19; tumor size indeterminate but based on the 2 mm calcifications it is currently being staged as T1 1 N0 stage IA, however we suspect that it is inaccurate clinical staging      08/23/2017 Surgery    Left mastectomy: LCIS, invasive cancer not identified in the specimen, 0/4 lymph nodes negative, based on the biopsy, 2 foci of invasive lobular cancer larger span 0.7 cm grade 2 ER 40%, PR 90%, HER-2 negative ratio 1.19, Ki-67 2%, T1BN0 stage I A;  right mastectomy: Atypical lobular hyperplasia       CHIEF COMPLIANT: Follow-up after recent bilateral mastectomies  INTERVAL HISTORY: Misty Lynch is a 61 year old with above-mentioned history of left breast invasive lobular carcinoma.  She underwent bilateral mastectomies and is here to discuss pathology reports.  She currently has drains is recovering from the surgeries.  Does have some discomfort related to the drains.  REVIEW OF SYSTEMS:   Constitutional: Denies fevers, chills or abnormal weight loss Eyes: Denies blurriness of vision Ears, nose, mouth, throat, and face: Denies mucositis or sore throat Respiratory: Denies cough, dyspnea or wheezes Cardiovascular:  Denies palpitation, chest discomfort Gastrointestinal:  Denies nausea, heartburn or change in bowel habits Skin: Denies abnormal skin rashes Lymphatics: Denies new lymphadenopathy or easy bruising Neurological:Denies numbness, tingling or new weaknesses Behavioral/Psych: Mood is stable, no new changes  Extremities: No lower extremity edema Breast: Bilateral mastectomies All other systems were reviewed with the patient and are negative.  I have reviewed the past medical history, past surgical history, social history and family history with the patient and they are unchanged from previous note.  ALLERGIES:  is allergic to tetracyclines & related.  MEDICATIONS:  Current Outpatient Medications  Medication Sig Dispense Refill  . aspirin EC 81 MG tablet Take 81 mg by mouth daily.    . Calcium-Vitamin D-Vitamin K 500-100-40 MG-UNT-MCG CHEW Chew by mouth 2 (two) times daily with a meal.    . losartan-hydrochlorothiazide (HYZAAR) 100-25 MG tablet Take 1 tablet by mouth daily.    Marland Kitchen oxyCODONE (OXY IR/ROXICODONE) 5 MG immediate release tablet Take 1-2 tablets (5-10 mg total) by mouth every 4 (four) hours as needed for moderate pain. 40 tablet 0  . sulfamethoxazole-trimethoprim (BACTRIM DS,SEPTRA DS) 800-160 MG tablet Take 1 tablet by mouth 2 (two) times daily. 12 tablet 0  . venlafaxine XR (EFFEXOR-XR) 75 MG 24 hr capsule Take 75 mg by mouth daily with breakfast.     No current facility-administered medications for this visit.     PHYSICAL EXAMINATION: ECOG PERFORMANCE STATUS: 1 - Symptomatic but completely ambulatory  There were no vitals filed for this visit. There were no vitals filed for this visit.  GENERAL:alert, no distress and comfortable SKIN: skin color, texture, turgor are normal, no rashes or significant lesions  EYES: normal, Conjunctiva are pink and non-injected, sclera clear OROPHARYNX:no exudate, no erythema and lips, buccal mucosa, and tongue normal  NECK: supple, thyroid  normal size, non-tender, without nodularity LYMPH:  no palpable lymphadenopathy in the cervical, axillary or inguinal LUNGS: clear to auscultation and percussion with normal breathing effort HEART: regular rate & rhythm and no murmurs and no lower extremity edema ABDOMEN:abdomen soft, non-tender and normal bowel sounds MUSCULOSKELETAL:no cyanosis of digits and no clubbing  NEURO: alert & oriented x 3 with fluent speech, no focal motor/sensory deficits EXTREMITIES: No lower extremity edema  LABORATORY DATA:  I have reviewed the data as listed   Chemistry      Component Value Date/Time   NA 140 08/21/2017 1025   NA 142 07/03/2017 0819   K 3.2 (L) 08/21/2017 1025   K 3.5 07/03/2017 0819   CL 103 08/21/2017 1025   CO2 30 08/21/2017 1025   CO2 26 07/03/2017 0819   BUN 13 08/21/2017 1025   BUN 19.6 07/03/2017 0819   CREATININE 0.77 08/21/2017 1025   CREATININE 0.8 07/03/2017 0819      Component Value Date/Time   CALCIUM 9.4 08/21/2017 1025   CALCIUM 9.5 07/03/2017 0819   ALKPHOS 60 07/03/2017 0819   AST 19 07/03/2017 0819   ALT 25 07/03/2017 0819   BILITOT 0.53 07/03/2017 0819       Lab Results  Component Value Date   WBC 6.5 07/03/2017   HGB 13.2 07/03/2017   HCT 40.4 07/03/2017   MCV 90.8 07/03/2017   PLT 258 07/03/2017   NEUTROABS 4.4 07/03/2017    ASSESSMENT & PLAN:  Malignant neoplasm of upper-outer quadrant of left breast in female, estrogen receptor positive ([place]) 08/23/2017 left mastectomy: LCIS, invasive cancer not identified in the specimen, 0/4 lymph nodes negative, based on the biopsy, 2 foci of invasive lobular cancer larger span 0.7 cm grade 2 ER 40%, PR 90%, HER-2 negative ratio 1.19, Ki-67 2%, T1BN0 stage I A;  right mastectomy: Atypical lobular hyperplasia  Pathology counseling: I discussed the final pathology report of the patient provided  a copy of this report. I discussed the margins as well as lymph node surgeries. We also discussed the final staging  along with previously performed ER/PR and HER-2/neu testing.  Recommendation: Adjuvant antiestrogen therapy with anastrozole 1 mg p.o. daily between 5-7 years  Anastrozole counseling: We discussed the risks and benefits of anti-estrogen therapy with aromatase inhibitors. These include but not limited to insomnia, hot flashes, mood changes, vaginal dryness, bone density loss, and weight gain. We strongly believe that the benefits far outweigh the risks. Patient understands these risks and consented to starting treatment. Planned treatment duration is 5-7 years.  Return to clinic in 3 months for survivorship care plan visit    I spent 25 minutes talking to the patient of which more than half was spent in counseling and coordination of care.  No orders of the defined types were placed in this encounter.  The patient has a good understanding of the overall plan. she agrees with it. she will call with any problems that may develop before the next visit here.   Rulon Eisenmenger, MD 09/02/17

## 2017-09-02 NOTE — Telephone Encounter (Signed)
Gave patient avs report and appointment for March  °

## 2017-09-02 NOTE — Assessment & Plan Note (Signed)
08/23/2017 left mastectomy: LCIS, invasive cancer not identified in the specimen, 0/4 lymph nodes negative, based on the biopsy, 2 foci of invasive lobular cancer larger span 0.7 cm grade 2 ER 40%, PR 90%, HER-2 negative ratio 1.19, Ki-67 2%, T1BN0 stage I A;  right mastectomy: Atypical lobular hyperplasia  Pathology counseling: I discussed the final pathology report of the patient provided  a copy of this report. I discussed the margins as well as lymph node surgeries. We also discussed the final staging along with previously performed ER/PR and HER-2/neu testing.  Recommendation: Adjuvant antiestrogen therapy with anastrozole 1 mg p.o. daily between 5-7 years  Anastrozole counseling: We discussed the risks and benefits of anti-estrogen therapy with aromatase inhibitors. These include but not limited to insomnia, hot flashes, mood changes, vaginal dryness, bone density loss, and weight gain. We strongly believe that the benefits far outweigh the risks. Patient understands these risks and consented to starting treatment. Planned treatment duration is 5-7 years.  Return to clinic in 3 months for survivorship care plan was

## 2017-09-16 ENCOUNTER — Encounter (HOSPITAL_COMMUNITY): Payer: Self-pay | Admitting: *Deleted

## 2017-09-16 ENCOUNTER — Other Ambulatory Visit: Payer: Self-pay

## 2017-09-16 NOTE — H&P (Signed)
  Subjective:     Patient ID: Misty Lynch is a 61 y.o. female.  HPI  3.5 weeks post op. Onset erythema right chest yesterday, no fevers. Cipro started. Reports not feeling well for several days.  Presented following screening MMG with two areas calcifications left breast, 7.1 cm apart. Biopsy left UOQ with ILC with LCIS. Biopsy left UIQ with ILC with ADH. Both ER/PR+, Her2-. [place] axilla negative.  Final pathology right breast with ALH, flat epithelial atypia, 0/1 LN. Left breast with LCIS, no invasive tumor seen, 0/4 SLN.  Plan adjuvant antiestrogen therapy with anastrozole 1 mg p.o. daily between 5-7 years, next oncology f/u 12/2017.  Prior 38 B, happy with this. 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      Objective:   Physical Exam  Cardiovascular: Normal rate, regular rhythm and normal heart sounds.   Pulmonary/Chest: Effort normal and breath sounds normal.   Chest:left IMF lateral extent scar stable 1 cm eschar, right nipple dry eschar Right chest with ballotable fluid collection, erythema nearly entire right chest with epidermolysus  Assessment:     Left breat ca UOQ ER+ ([facility]), Left breast ca UIQ ER+ ([facility]) S/p bilateral NSM, TE/ADM (Alloderm) reconstruction Cellulitis right chest    Plan:   Aspirated over 50 ml thick yellow brow fluid. Culture obtained. Recommend removal right TE, placement drain. Can complete left expansion in OR.   Counseled patient can replace expander in few months once infection clear.  Natrelle 133MX-13 T 500 ml tissue expanders placed bilateral,  RIGHT initial fill volume 395 ml + ml= ml.  LEFT  initial fill volume 400 + ml= ml.    [name], MD [facility] [phone], pin [phone]

## 2017-09-16 NOTE — Progress Notes (Signed)
Pt denies SOB, chest pain, and being under the care of a cardiologist. Pt denies having an echo and cardiac cath. Pt denies having a chest x ray within the last year. Pt denies recent labs. Pt made aware to stop taking Aspirin, vitamins, fish oil and herbal medications. Do not take any NSAIDs ie: Ibuprofen, Advil, Naproxen (Aleve), Motrin, BC and Goody Powder or any medication containing Aspirin. Pt verbalized understanding of all pre-op instructions.

## 2017-09-17 ENCOUNTER — Encounter (HOSPITAL_COMMUNITY)
Admission: RE | Disposition: A | Discharge status: Home / Self Care | Payer: Self-pay | Source: Ambulatory Visit | Attending: Plastic Surgery

## 2017-09-17 ENCOUNTER — Ambulatory Visit (HOSPITAL_COMMUNITY): Payer: 59 | Admitting: Anesthesiology

## 2017-09-17 ENCOUNTER — Encounter (HOSPITAL_COMMUNITY): Payer: Self-pay | Admitting: *Deleted

## 2017-09-17 ENCOUNTER — Ambulatory Visit (HOSPITAL_COMMUNITY)
Admission: RE | Admit: 2017-09-17 | Discharge: 2017-09-17 | Disposition: A | Discharge status: Home / Self Care | Payer: 59 | Source: Ambulatory Visit | Attending: Plastic Surgery | Admitting: Plastic Surgery

## 2017-09-17 DIAGNOSIS — Z853 Personal history of malignant neoplasm of breast: Secondary | ICD-10-CM | POA: Diagnosis not present

## 2017-09-17 DIAGNOSIS — F329 Major depressive disorder, single episode, unspecified: Secondary | ICD-10-CM | POA: Diagnosis not present

## 2017-09-17 DIAGNOSIS — Z45812 Encounter for adjustment or removal of left breast implant: Secondary | ICD-10-CM | POA: Insufficient documentation

## 2017-09-17 DIAGNOSIS — N6489 Other specified disorders of breast: Secondary | ICD-10-CM | POA: Insufficient documentation

## 2017-09-17 DIAGNOSIS — F419 Anxiety disorder, unspecified: Secondary | ICD-10-CM | POA: Diagnosis not present

## 2017-09-17 DIAGNOSIS — Z9013 Acquired absence of bilateral breasts and nipples: Secondary | ICD-10-CM | POA: Diagnosis not present

## 2017-09-17 DIAGNOSIS — Z87891 Personal history of nicotine dependence: Secondary | ICD-10-CM | POA: Diagnosis not present

## 2017-09-17 DIAGNOSIS — L03313 Cellulitis of chest wall: Secondary | ICD-10-CM | POA: Insufficient documentation

## 2017-09-17 DIAGNOSIS — I10 Essential (primary) hypertension: Secondary | ICD-10-CM | POA: Insufficient documentation

## 2017-09-17 HISTORY — PX: REMOVAL OF TISSUE EXPANDER AND PLACEMENT OF IMPLANT: SHX6457

## 2017-09-17 LAB — CBC WITH DIFFERENTIAL/PLATELET
Basophils Absolute: 0 10*3/uL (ref 0.0–0.1)
Basophils Relative: 0 %
EOS PCT: 1 %
Eosinophils Absolute: 0.1 10*3/uL (ref 0.0–0.7)
HCT: 34 % — ABNORMAL LOW (ref 36.0–46.0)
Hemoglobin: 11.3 g/dL — ABNORMAL LOW (ref 12.0–15.0)
LYMPHS PCT: 8 %
Lymphs Abs: 1 10*3/uL (ref 0.7–4.0)
MCH: 29.5 pg (ref 26.0–34.0)
MCHC: 33.2 g/dL (ref 30.0–36.0)
MCV: 88.8 fL (ref 78.0–100.0)
MONO ABS: 0.8 10*3/uL (ref 0.1–1.0)
Monocytes Relative: 7 %
Neutro Abs: 10.4 10*3/uL — ABNORMAL HIGH (ref 1.7–7.7)
Neutrophils Relative %: 84 %
PLATELETS: 443 10*3/uL — AB (ref 150–400)
RBC: 3.83 MIL/uL — ABNORMAL LOW (ref 3.87–5.11)
RDW: 13.9 % (ref 11.5–15.5)
WBC: 12.4 10*3/uL — ABNORMAL HIGH (ref 4.0–10.5)

## 2017-09-17 LAB — BASIC METABOLIC PANEL
Anion gap: 13 (ref 5–15)
BUN: 12 mg/dL (ref 6–20)
CALCIUM: 8.6 mg/dL — AB (ref 8.9–10.3)
CO2: 30 mmol/L (ref 22–32)
Chloride: 92 mmol/L — ABNORMAL LOW (ref 101–111)
Creatinine, Ser: 0.8 mg/dL (ref 0.44–1.00)
GFR calc Af Amer: >60 mL/min (ref >60)
GLUCOSE: 120 mg/dL — AB (ref 65–99)
Potassium: 2.5 mmol/L — CL (ref 3.5–5.1)
Sodium: 135 mmol/L (ref 135–145)

## 2017-09-17 SURGERY — REMOVAL, TISSUE EXPANDER, BREAST, WITH IMPLANT INSERTION
Anesthesia: General | Site: Breast | Laterality: Bilateral

## 2017-09-17 MED ORDER — CHLORHEXIDINE GLUCONATE CLOTH 2 % EX PADS: 6.0000 | MEDICATED_PAD | Freq: Once | CUTANEOUS | Status: DC

## 2017-09-17 MED ORDER — POTASSIUM CHLORIDE 10 MEQ/100ML IV SOLN
10.0000 meq | INTRAVENOUS | Status: DC
  Administered 2017-09-17: 10 meq via INTRAVENOUS

## 2017-09-17 MED ORDER — BACITRACIN ZINC 500 UNIT/GM EX OINT
TOPICAL_OINTMENT | CUTANEOUS | Status: DC | PRN
  Administered 2017-09-17: 1 via TOPICAL

## 2017-09-17 MED ORDER — LIDOCAINE HCL (CARDIAC) 20 MG/ML IV SOLN: INTRAVENOUS | Status: DC | PRN

## 2017-09-17 MED ORDER — MIDAZOLAM HCL 2 MG/2ML IJ SOLN
INTRAMUSCULAR | Status: AC
  Filled 2017-09-17: qty 2

## 2017-09-17 MED ORDER — BACITRACIN ZINC 500 UNIT/GM EX OINT
TOPICAL_OINTMENT | CUTANEOUS | Status: AC
  Filled 2017-09-17: qty 28.35

## 2017-09-17 MED ORDER — PROPOFOL 10 MG/ML IV BOLUS
INTRAVENOUS | Status: DC | PRN
Start: 2017-09-17 — End: 2017-09-17
  Administered 2017-09-17: 60 mg via INTRAVENOUS
  Administered 2017-09-17: 150 mg via INTRAVENOUS
  Administered 2017-09-17: 50 mg via INTRAVENOUS

## 2017-09-17 MED ORDER — FENTANYL CITRATE (PF) 250 MCG/5ML IJ SOLN
INTRAMUSCULAR | Status: AC
  Filled 2017-09-17: qty 5

## 2017-09-17 MED ORDER — MIDAZOLAM HCL 5 MG/5ML IJ SOLN
INTRAMUSCULAR | Status: DC | PRN
  Administered 2017-09-17: 2 mg via INTRAVENOUS

## 2017-09-17 MED ORDER — HYDROCODONE-ACETAMINOPHEN 5-325 MG PO TABS: 1.0000 | ORAL_TABLET | Freq: Four times a day (QID) | ORAL | 0 refills | Status: DC | PRN

## 2017-09-17 MED ORDER — 0.9 % SODIUM CHLORIDE (POUR BTL) OPTIME
TOPICAL | Status: DC | PRN
  Administered 2017-09-17: 1000 mL

## 2017-09-17 MED ORDER — LIDOCAINE HCL (CARDIAC) 20 MG/ML IV SOLN
INTRAVENOUS | Status: DC | PRN
  Administered 2017-09-17: 100 mg via INTRAVENOUS

## 2017-09-17 MED ORDER — POTASSIUM CHLORIDE 10 MEQ/100ML IV SOLN
INTRAVENOUS | Status: AC
  Administered 2017-09-17: 10 meq via INTRAVENOUS
  Filled 2017-09-17: qty 100

## 2017-09-17 MED ORDER — ONDANSETRON HCL 4 MG/2ML IJ SOLN: 4.0000 mg | Freq: Once | INTRAMUSCULAR | Status: DC | PRN

## 2017-09-17 MED ORDER — DEXAMETHASONE SODIUM PHOSPHATE 10 MG/ML IJ SOLN
INTRAMUSCULAR | Status: DC | PRN
  Administered 2017-09-17: 5 mg via INTRAVENOUS

## 2017-09-17 MED ORDER — SODIUM CHLORIDE 0.9 % IV SOLN
INTRAVENOUS | Status: DC
  Filled 2017-09-17: qty 1

## 2017-09-17 MED ORDER — ONDANSETRON HCL 4 MG/2ML IJ SOLN
INTRAMUSCULAR | Status: DC | PRN
  Administered 2017-09-17: 4 mg via INTRAVENOUS

## 2017-09-17 MED ORDER — FENTANYL CITRATE (PF) 100 MCG/2ML IJ SOLN
INTRAMUSCULAR | Status: DC | PRN
  Administered 2017-09-17 (×2): 50 ug via INTRAVENOUS

## 2017-09-17 MED ORDER — LACTATED RINGERS IV SOLN
INTRAVENOUS | Status: DC
  Administered 2017-09-17 (×2): via INTRAVENOUS

## 2017-09-17 MED ORDER — SODIUM CHLORIDE 0.9 % IV SOLN
INTRAVENOUS | Status: DC | PRN
  Administered 2017-09-17: 13:00:00

## 2017-09-17 MED ORDER — VANCOMYCIN HCL IN DEXTROSE 1-5 GM/200ML-% IV SOLN
1000.0000 mg | INTRAVENOUS | Status: AC
  Administered 2017-09-17: 1000 mg via INTRAVENOUS
  Filled 2017-09-17: qty 200

## 2017-09-17 MED ORDER — FENTANYL CITRATE (PF) 100 MCG/2ML IJ SOLN: 25.0000 ug | INTRAMUSCULAR | Status: DC | PRN

## 2017-09-17 MED ORDER — MEPERIDINE HCL 25 MG/ML IJ SOLN: 6.2500 mg | INTRAMUSCULAR | Status: DC | PRN

## 2017-09-17 SURGICAL SUPPLY — 55 items
ADH SKN CLS APL DERMABOND .7 (GAUZE/BANDAGES/DRESSINGS) ×1
BAG DECANTER FOR FLEXI CONT (MISCELLANEOUS) ×3 IMPLANT
BINDER BREAST LRG (GAUZE/BANDAGES/DRESSINGS) IMPLANT
BINDER BREAST XLRG (GAUZE/BANDAGES/DRESSINGS) ×2 IMPLANT
BLADE 10 SAFETY STRL DISP (BLADE) ×3 IMPLANT
CANISTER SUCT 3000ML PPV (MISCELLANEOUS) ×3 IMPLANT
CHLORAPREP W/TINT 26ML (MISCELLANEOUS) ×3 IMPLANT
CLOSURE WOUND 1/2 X4 (GAUZE/BANDAGES/DRESSINGS) ×1
CONT SPEC 4OZ CLIKSEAL STRL BL (MISCELLANEOUS) ×2 IMPLANT
COVER SURGICAL LIGHT HANDLE (MISCELLANEOUS) ×3 IMPLANT
DERMABOND ADVANCED (GAUZE/BANDAGES/DRESSINGS) ×2
DERMABOND ADVANCED .7 DNX12 (GAUZE/BANDAGES/DRESSINGS) ×1 IMPLANT
DRAIN CHANNEL 15F RND FF W/TCR (WOUND CARE) ×3 IMPLANT
DRAIN CHANNEL 19F RND (DRAIN) ×2 IMPLANT
DRAPE ORTHO SPLIT 77X108 STRL (DRAPES) ×6
DRAPE SURG ORHT 6 SPLT 77X108 (DRAPES) ×2 IMPLANT
DRAPE WARM FLUID 44X44 (DRAPE) ×3 IMPLANT
DRSG EMULSION OIL 3X3 NADH (GAUZE/BANDAGES/DRESSINGS) ×2 IMPLANT
DRSG PAD ABDOMINAL 8X10 ST (GAUZE/BANDAGES/DRESSINGS) ×8 IMPLANT
ELECT BLADE 4.0 EZ CLEAN MEGAD (MISCELLANEOUS) ×3
ELECT COATED BLADE 2.86 ST (ELECTRODE) ×3 IMPLANT
ELECT REM PT RETURN 9FT ADLT (ELECTROSURGICAL) ×3
ELECTRODE BLDE 4.0 EZ CLN MEGD (MISCELLANEOUS) ×1 IMPLANT
ELECTRODE REM PT RTRN 9FT ADLT (ELECTROSURGICAL) ×1 IMPLANT
EVACUATOR SILICONE 100CC (DRAIN) ×5 IMPLANT
GAUZE SPONGE 4X4 12PLY STRL (GAUZE/BANDAGES/DRESSINGS) ×3 IMPLANT
GLOVE BIO SURGEON STRL SZ 6 (GLOVE) ×7 IMPLANT
GLOVE BIO SURGEON STRL SZ7 (GLOVE) ×4 IMPLANT
GLOVE SURG SS PI 6.0 STRL IVOR (GLOVE) ×5 IMPLANT
GOWN STRL REUS W/ TWL LRG LVL3 (GOWN DISPOSABLE) ×2 IMPLANT
GOWN STRL REUS W/TWL LRG LVL3 (GOWN DISPOSABLE) ×6
KIT BASIN OR (CUSTOM PROCEDURE TRAY) ×3 IMPLANT
KIT ROOM TURNOVER OR (KITS) ×3 IMPLANT
NEEDLE 21 GA WING INFUSION (NEEDLE) ×3 IMPLANT
NS IRRIG 1000ML POUR BTL (IV SOLUTION) ×6 IMPLANT
PACK GENERAL/GYN (CUSTOM PROCEDURE TRAY) ×3 IMPLANT
PAD ARMBOARD 7.5X6 YLW CONV (MISCELLANEOUS) ×3 IMPLANT
PIN SAFETY STERILE (MISCELLANEOUS) ×5 IMPLANT
SOL PREP POV-IOD 4OZ 10% (MISCELLANEOUS) IMPLANT
STAPLER VISISTAT 35W (STAPLE) ×3 IMPLANT
STRIP CLOSURE SKIN 1/2X4 (GAUZE/BANDAGES/DRESSINGS) ×1 IMPLANT
SUT CHROMIC 4 0 PS 2 18 (SUTURE) IMPLANT
SUT ETHILON 2 0 FS 18 (SUTURE) ×5 IMPLANT
SUT ETHILON 4 0 PS 2 18 (SUTURE) ×2 IMPLANT
SUT MNCRL AB 3-0 PS2 18 (SUTURE) ×4 IMPLANT
SUT MNCRL AB 4-0 PS2 18 (SUTURE) ×3 IMPLANT
SUT VIC AB 3-0 PS2 18 (SUTURE) ×3
SUT VIC AB 3-0 PS2 18XBRD (SUTURE) ×1 IMPLANT
SUT VIC AB 4-0 PS2 27 (SUTURE) ×3 IMPLANT
TOWEL OR 17X24 6PK STRL BLUE (TOWEL DISPOSABLE) ×3 IMPLANT
TOWEL OR 17X26 10 PK STRL BLUE (TOWEL DISPOSABLE) ×3 IMPLANT
TRAY FOLEY W/METER SILVER 14FR (SET/KITS/TRAYS/PACK) IMPLANT
TUBE CONNECTING 12'X1/4 (SUCTIONS) ×1
TUBE CONNECTING 12X1/4 (SUCTIONS) ×1 IMPLANT
YANKAUER SUCT BULB TIP NO VENT (SUCTIONS) ×2 IMPLANT

## 2017-09-17 NOTE — Anesthesia Preprocedure Evaluation (Addendum)
Anesthesia Evaluation  Patient identified by MRN, date of birth, ID band Patient awake    Reviewed: Allergy & Precautions, NPO status , Patient's Chart, lab work & pertinent test results  Airway Mallampati: II  TM Distance: >3 FB Neck ROM: Full    Dental no notable dental hx.    Pulmonary neg pulmonary ROS, former smoker,    Pulmonary exam normal breath sounds clear to auscultation       Cardiovascular hypertension, Pt. on medications Normal cardiovascular exam Rhythm:Regular Rate:Normal     Neuro/Psych PSYCHIATRIC DISORDERS Anxiety Depression negative neurological ROS  negative psych ROS   GI/Hepatic negative GI ROS, Neg liver ROS,   Endo/Other  negative endocrine ROS  Renal/GU negative Renal ROS  negative genitourinary   Musculoskeletal negative musculoskeletal ROS (+)   Abdominal   Peds negative pediatric ROS (+)  Hematology negative hematology ROS (+)   Anesthesia Other Findings   Reproductive/Obstetrics negative OB ROS                             Anesthesia Physical  Anesthesia Plan  ASA: II  Anesthesia Plan: General   Post-op Pain Management:    Induction: Intravenous  PONV Risk Score and Plan: 3 and Ondansetron, Dexamethasone, Midazolam and Treatment may vary due to age or medical condition  Airway Management Planned: LMA and Oral ETT  Additional Equipment:   Intra-op Plan:   Post-operative Plan: Extubation in OR  Informed Consent: I have reviewed the patients History and Physical, chart, labs and discussed the procedure including the risks, benefits and alternatives for the proposed anesthesia with the patient or authorized representative who has indicated his/her understanding and acceptance.   Dental advisory given  Plan Discussed with: CRNA  Anesthesia Plan Comments:        Anesthesia Quick Evaluation

## 2017-09-17 NOTE — Progress Notes (Addendum)
Dr. Ambrose Pancoast notified of patient's potassium.  Patient on monitor.  Will continue to monitor.  Received verbal order to start 5 runs IV potassium 64mEQ.

## 2017-09-17 NOTE — Interval H&P Note (Signed)
History and Physical Interval Note:  09/17/2017 9:50 AM  Misty Lynch  has presented today for surgery, with the diagnosis of hx of breast cancer  The various methods of treatment have been discussed with the patient and family. After consideration of risks, benefits and other options for treatment, the patient has consented to  Procedure(s): REMOVAL OF TISSUE EXPANDER (Right) as a surgical intervention .  The patient's history has been reviewed, patient examined, no change in status, stable for surgery.  I have reviewed the patient's chart and labs.  Questions were answered to the patient's satisfaction.     Mariette Cowley

## 2017-09-17 NOTE — Anesthesia Procedure Notes (Signed)
Procedure Name: LMA Insertion Date/Time: 09/17/2017 12:42 PM Performed by: Scheryl Darter, CRNA Pre-anesthesia Checklist: Patient identified, Emergency Drugs available, Suction available and Patient being monitored Patient Re-evaluated:Patient Re-evaluated prior to induction Oxygen Delivery Method: Circle System Utilized Preoxygenation: Pre-oxygenation with 100% oxygen Induction Type: IV induction Ventilation: Mask ventilation without difficulty LMA: LMA inserted LMA Size: 4.0 Number of attempts: 1 Airway Equipment and Method: Bite block Placement Confirmation: positive ETCO2 Tube secured with: Tape Dental Injury: Teeth and Oropharynx as per pre-operative assessment

## 2017-09-17 NOTE — Op Note (Signed)
Operative Note   DATE OF OPERATION: 11.20.18  LOCATION: [facility]-outpatient   SURGICAL DIVISION: Plastic Surgery  PREOPERATIVE DIAGNOSES:  1. Acquired absence bilateral breasts 2. History left breast cancer 3. Right chest cellulitis  POSTOPERATIVE DIAGNOSES:  same  PROCEDURE:  1. Removal right chest tissue expander 2. Expansion left chest  SURGEON: [name] MD MBA  ASSISTANT: none  ANESTHESIA:  General.   EBL: 35 ml  COMPLICATIONS: None immediate.   INDICATIONS FOR PROCEDURE:  The patient, Misty Lynch, is a 61 y.o. female born on Mar 08, 1956, is here for removal of right chest tissue expander in setting cellulitis and drainage from old drain site. She is postoperative from bilateral nipple sparing mastectomies with immediate expander acellular dermis reconstruction. She has had eschar dry of right nipple since 1st week postoperative; following removal of this no full thickness injury noted.   FINDINGS: Additional 85 ml infiltrated in left tissue expander.   DESCRIPTION OF PROCEDURE:  The patient's operative site was marked with the patient in the preoperative area. The patient was taken to the operating room. SCDs were placed and IV antibiotics were given. The patient's operative site was prepped and draped in a sterile fashion. A time out was performed and all information was confirmed to be correct. Shave excision of right nipple eschar completed. This was noted to be partial thickness ischemia and underlying tissue viable. Incision then made in right inframammary scar and carried through superficial fascia to implant cavity. Thick fluid encountered. Expander and entirety of ADM removed. Curettage performed over cavity. Cavity irrigated with saline solution containing Ancef, gentamicin and bacitracin followed by Betadine. 19 Fr. JP placed and secured with 2-0 nylon. Closure completed with 3-0 monocryl in superficial fascia, 3-0 monocryl in dermis and short runnning 4-0 nylon  for skin closure. Dermabond applied to IMF incision followed by steri strips. Antibiotic ointment and adaptic applied to right nipple. Dry dressing applied.  Following removal of drapes, I accessed left chest port and infiltrated additional 85 ml saline. Breast binder applied.  The patient was allowed to wake from anesthesia, extubated and taken to the recovery room in satisfactory condition.   SPECIMENS: right nipple  DRAINS: 19 Fr JP in right chest  [name], MD [facility] [phone], pin [phone]

## 2017-09-17 NOTE — Transfer of Care (Signed)
Immediate Anesthesia Transfer of Care Note  Patient: Misty Lynch  Procedure(s) Performed: REMOVAL OF RIGHT TISSUE EXPANDER, LEFT CHEST TISSUE EXPANSION (Bilateral Breast)  Patient Location: PACU  Anesthesia Type:General  Level of Consciousness: awake, alert , oriented and sedated  Airway & Oxygen Therapy: Patient Spontanous Breathing and Patient connected to nasal cannula oxygen  Post-op Assessment: Report given to RN and Post -op Vital signs reviewed and stable  Post vital signs: Reviewed and stable  Last Vitals:  Vitals:   09/17/17 0950  BP: (!) 141/67  Pulse: 77  Resp: 18  Temp: 36.7 C  SpO2: 100%    Last Pain:  Vitals:   09/17/17 1010  TempSrc:   PainSc: 8       Patients Stated Pain Goal: 1 (44/03/47 4259)  Complications: No apparent anesthesia complications

## 2017-09-18 ENCOUNTER — Encounter (HOSPITAL_COMMUNITY): Payer: Self-pay | Admitting: Plastic Surgery

## 2017-09-18 NOTE — Anesthesia Postprocedure Evaluation (Signed)
Anesthesia Post Note  Patient: Misty Lynch  Procedure(s) Performed: REMOVAL OF RIGHT TISSUE EXPANDER, LEFT CHEST TISSUE EXPANSION (Bilateral Breast)     Patient location during evaluation: PACU Anesthesia Type: General Level of consciousness: awake and alert Pain management: pain level controlled Vital Signs Assessment: post-procedure vital signs reviewed and stable Respiratory status: spontaneous breathing, nonlabored ventilation, respiratory function stable and patient connected to nasal cannula oxygen Cardiovascular status: blood pressure returned to baseline and stable Postop Assessment: no apparent nausea or vomiting Anesthetic complications: no    Last Vitals:  Vitals:   09/17/17 1339 09/17/17 1411  BP: (!) 148/74 140/87  Pulse: 92 79  Resp: 12 17  Temp: 36.4 C 36.4 C  SpO2: 98% 95%    Last Pain:  Vitals:   09/17/17 1010  TempSrc:   PainSc: 8    Pain Goal: Patients Stated Pain Goal: 1 (09/17/17 1010)               Hiroko Tregre

## 2017-10-03 ENCOUNTER — Other Ambulatory Visit: Payer: Self-pay

## 2017-10-03 ENCOUNTER — Ambulatory Visit: Payer: 59 | Attending: Plastic Surgery | Admitting: Physical Therapy

## 2017-10-03 DIAGNOSIS — M6281 Muscle weakness (generalized): Secondary | ICD-10-CM | POA: Diagnosis present

## 2017-10-03 DIAGNOSIS — M25611 Stiffness of right shoulder, not elsewhere classified: Secondary | ICD-10-CM | POA: Insufficient documentation

## 2017-10-03 DIAGNOSIS — Z483 Aftercare following surgery for neoplasm: Secondary | ICD-10-CM | POA: Insufficient documentation

## 2017-10-03 DIAGNOSIS — R293 Abnormal posture: Secondary | ICD-10-CM | POA: Insufficient documentation

## 2017-10-03 DIAGNOSIS — M25612 Stiffness of left shoulder, not elsewhere classified: Secondary | ICD-10-CM | POA: Insufficient documentation

## 2017-10-03 NOTE — Patient Instructions (Signed)
First of all, check with your insurance company to see if provider is in network    [facility]   (for wigs and compression sleeves / gloves/gauntlets )  [address] [place], [place] [address] [phone]  Will file some insurances --- call for appointment   [facility] (for mastectomy prosthetics and garments) [address] [place], [place] [address] [phone] Will file some insurances --- call for appointment   [facility]  [address]  , [place] [address] [phone] Lower extremity garments  [facility] [address] [place], [place]  [address] [phone]  [employer] Sales rep:  Matt Lawson:  [phone] [email or web address] Biocompression pumps   [facility]  Sales rep: Robert Rollins:  [phone] [email or web address] Entre and Flexitouch pumps    Other Resources: National Lymphedema Network:  www.lymphnet.org [email or web address] for patient articles and purchase a self manual lymph drainage DVD [email or web address] has informative articles.  

## 2017-10-03 NOTE — Therapy (Signed)
[place], [place], [address] Phone: [phone]   Fax:  [phone]  Physical Therapy Evaluation  Patient Details  Name: Misty Lynch MRN: [id number] Date of Birth: 1955/12/10 Referring Provider: Dr. Iran Planas    Encounter Date: 10/03/2017  PT End of Session - 10/03/17 1554    Visit Number  1    Number of Visits  9    Date for PT Re-Evaluation  11/05/17    PT Start Time  1435    PT Stop Time  1530    PT Time Calculation (min)  55 min    Activity Tolerance  Patient tolerated treatment well    Behavior During Therapy  [facility] for tasks assessed/performed       Past Medical History:  Diagnosis Date  . Anxiety   . Cancer ([place]) 06/2017   left breast cancer  . Depression   . Heart murmur   . Hypertension   . Snores     Past Surgical History:  Procedure Laterality Date  . BREAST RECONSTRUCTION WITH PLACEMENT OF TISSUE EXPANDER AND FLEX HD (ACELLULAR HYDRATED DERMIS) Bilateral 08/23/2017   Procedure: BREAST RECONSTRUCTION WITH PLACEMENT OF TISSUE EXPANDER AND Alloderm (ACELLULAR HYDRATED DERMIS);  Surgeon: [name], MD;  Location: [place];  Service: Plastics;  Laterality: Bilateral;  . COLONOSCOPY    . DILATION AND CURETTAGE OF UTERUS    . NIPPLE SPARING MASTECTOMY/SENTINAL LYMPH NODE BIOPSY/RECONSTRUCTION/PLACEMENT OF TISSUE EXPANDER Bilateral 08/23/2017   Procedure: BILATERAL NIPPLE SPARING MASTECTOMIES WITH LEFT SENTINEL LYMPH NODE BIOPSY;  Surgeon: Excell Seltzer, MD;  Location: [place];  Service: General;  Laterality: Bilateral;  . REMOVAL OF TISSUE EXPANDER AND PLACEMENT OF IMPLANT Bilateral 09/17/2017   Procedure: REMOVAL OF RIGHT TISSUE EXPANDER, LEFT CHEST TISSUE EXPANSION;  Surgeon: [name], MD;  Location: [place];  Service: Plastics;  Laterality: Bilateral;  . TUBAL LIGATION      There were no vitals filed for this  visit.   Subjective Assessment - 10/03/17 1437    Subjective  Pt is having problems with her stamina. she feels tightness in her chest  She has little knowlege of lymphedema and will taking a trip to [place] in the Spring     Pertinent History  Patient was diagnosed on 06/24/17 with left grade 1-2 invasive lobular carcinoma breast cancer. There are 2 areas of cancer located in the upper inner quadrant and upper outer quadrant which is an area of distortion with calcs. It is ER/PR positive and HER2 negative with a Ki67 of 2%. She had physical therapy earlier this year (patient estimated she had 8 visits) for left shoulder pain and stiffness which she reports is still an issue for her at times.  Pt under double mastectomy ( 4 lymph nodes removed on the left ) on 08/23/2017 with immediate expander placement.  She had a staph infection on right side and had expander removal on 09/17/2017.There is no expander on the right but will have expander replaced in January. She will have implant sometime after April  She will no radiation or chemo.     Patient Stated Goals  wants to help her posture     Currently in Pain?  No/denies         [facility] PT Assessment - 10/03/17 0001      Assessment   Medical Diagnosis  Left breast cancer    Referring Provider  Dr. Iran Planas     Onset Date/Surgical Date  06/24/17    Hand Dominance  Right    Prior Therapy  none      Precautions   Precautions  Other (comment)    Precaution Comments  Active cancer      Restrictions   Weight Bearing Restrictions  No      Balance Screen   Has the patient fallen in the past 6 months  No    Has the patient had a decrease in activity level because of a fear of falling?   No    Is the patient reluctant to leave their home because of a fear of falling?   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 Iran Planas is aware.  She has very tight adhrerent scar at lateral chest toward axilla  Well healed expander placement on left chest     Quick DASH   13.64      Sensation   Light Touch  Not tested      Coordination   Gross Motor Movements are Fluid and Coordinated  No pt observed to have difficulty putting on her jacket       Posture/Postural Control   Posture/Postural Control  Postural limitations    Postural Limitations  Rounded Shoulders;Forward head      ROM / Strength   AROM / PROM / Strength  AROM;Strength      AROM   Right Shoulder Extension  --    Right Shoulder Flexion  135 Degrees    Right Shoulder ABduction  115 Degrees    Right Shoulder Internal Rotation  --    Right Shoulder External Rotation  --    Left Shoulder Extension  --    Left Shoulder Flexion  143 Degrees    Left Shoulder ABduction  136 Degrees    Left Shoulder Internal Rotation  --    Left Shoulder External Rotation  --    Cervical Flexion  --    Cervical Extension  --    Cervical - Right Side Bend  --    Cervical - Left Side Bend  --    Cervical - Right Rotation  --    Cervical - Left Rotation  --      Strength   Overall Strength  --    Overall Strength Comments  both arms ~ 3-/5 limited by general weakness         LYMPHEDEMA/ONCOLOGY QUESTIONNAIRE - 10/03/17 1543      Type   Cancer Type  left breast cancer       Right Upper Extremity Lymphedema   10 cm Proximal to Olecranon Process  34 cm    Olecranon  Process  27.5 cm    10 cm Proximal to Ulnar Styloid Process  20.4 cm    Just Proximal to Ulnar Styloid Process  15.3 cm    Across Hand at [employer]  18.2 cm    At [place] of 2nd Digit  5.6 cm      Left Upper Extremity Lymphedema   10 cm Proximal to Olecranon Process  32.5 cm    Olecranon Process  25.6 cm    10 cm Proximal to Ulnar Styloid Process  21.5 cm    Just Proximal to Ulnar Styloid Process  14.7 cm    Across Hand at [employer]  18 cm    At [place] of 2nd Digit  5.5 cm        Quick Dash - 10/03/17 0001    Open a tight or new jar  Moderate difficulty    Do heavy household chores (wash walls, wash floors)  Moderate difficulty    Carry a shopping bag or briefcase  No difficulty    Wash your back  No difficulty    Use a knife to cut food  No difficulty    Recreational activities in which you take some force or impact through your arm, shoulder, or hand (golf, hammering, tennis)  Moderate difficulty    During the past week, to what extent has your arm, shoulder or hand problem interfered with your normal social activities with family, friends, neighbors, or groups?  Not at all    During the past week, to what extent has your arm, shoulder or hand problem limited your work or other regular daily activities  Not at all    Arm, shoulder, or hand pain.  None    Tingling (pins and needles) in your arm, shoulder, or hand  None    Difficulty Sleeping  No difficulty    DASH Score  13.64 %       Objective measurements completed on examination: See above findings.      [facility] Adult PT Treatment/Exercise - 10/03/17 0001      Self-Care   Self-Care  Other Self-Care Comments    Other Self-Care Comments   pt given lots of information about community exercise programs for in the future.  Also given prescription to take to MD for signiture for prophylactic sleeve and possibley gauntlet for plane travel.  educated pt about ABC class       Shoulder Exercises: Supine   Flexion   AAROM;Both;5 reps with dowel rod     ABduction  AAROM;Both;5 reps    ABduction Limitations  with dowel rod              PT Education - 10/03/17 1551    Education provided  Yes    Education Details  where to get a compression sleeve. , dowel rod exercises     Methods  Explanation;Demonstration;Handout    Comprehension  Verbalized understanding          PT Long Term Goals - 10/03/17 1748      PT LONG TERM GOAL #1   Title  Pt will verbalize lymphedema risk reduction practices     Time  4    Period  Weeks    Status  New      PT LONG TERM GOAL #2   Title  Pt will be independent in a home exercise program for shoulder range of motion and strength     Time  4    Period  Weeks    Status  New      PT LONG  TERM GOAL #3   Title  Pt will increase AROM of left and right shoulder to 140 degrees     Baseline  12//03/2017 Lt: 136, Rt. 115     Time  4    Period  Weeks    Status  New            Plan - 10/03/17 1742    Clinical Impression Statement  Pt comes to PT with tightness in her chest, especially on her right side after complications from an infected expander site.  She still has a healing wound with thick eschar and tight adherent skin and scar.     History and Personal Factors relevant to plan of care:  recent left shoulder impairment with occasional pain. no knowledge of lymphedema risk reduction     Clinical Presentation  Evolving    Clinical Presentation due to:  delayed healing of wound on right chest after staph infection at expander site     Clinical Decision Making  Moderate    Rehab Potential  Good    Clinical Impairments Affecting Rehab Potential  Pre-existing shoulder dysfunction    PT Frequency  2x / week    PT Duration  4 weeks    PT Treatment/Interventions  Patient/family education;Therapeutic exercise;ADLs/Self Care Home Management;Scar mobilization;Passive range of motion;Taping;Manual techniques;Moist Heat;Therapeutic activities;DME Instruction;Manual  lymph drainage;Compression bandaging    PT Next Visit Plan  Progress shoulder AA/AROM and HEP. manual techniques to scar at right chest make sure pt is signed up for ABC class or teach lymphedema risk reduction     Consulted and Agree with Plan of Care  Patient       Patient will benefit from skilled therapeutic intervention in order to improve the following deficits and impairments:  Postural dysfunction, Decreased knowledge of precautions, Pain, Impaired UE functional use, Decreased range of motion, Decreased skin integrity, Decreased scar mobility, Decreased strength, Increased fascial restricitons  Visit Diagnosis: Aftercare following surgery for neoplasm  Abnormal posture  Stiffness of right shoulder, not elsewhere classified  Stiffness of left shoulder, not elsewhere classified  Muscle weakness (generalized)     Problem List Patient Active Problem List   Diagnosis Date Noted  . Breast cancer, left breast ([place]) 08/23/2017  . Malignant neoplasm of upper-outer quadrant of left breast in female, estrogen receptor positive ([place]) 07/02/2017   Donato Heinz. Owens Shark PT  [place] Levo 10/03/2017, 5:53 PM  [place], [place], [address] Phone: [phone]   Fax:  [phone]  Name: Misty Lynch MRN: [id number] Date of Birth: 1955/11/28

## 2017-10-11 ENCOUNTER — Encounter: Payer: Self-pay | Admitting: Physical Therapy

## 2017-10-11 ENCOUNTER — Ambulatory Visit: Payer: 59 | Admitting: Physical Therapy

## 2017-10-11 DIAGNOSIS — R293 Abnormal posture: Secondary | ICD-10-CM

## 2017-10-11 DIAGNOSIS — Z483 Aftercare following surgery for neoplasm: Secondary | ICD-10-CM | POA: Diagnosis not present

## 2017-10-11 DIAGNOSIS — M25612 Stiffness of left shoulder, not elsewhere classified: Secondary | ICD-10-CM

## 2017-10-11 DIAGNOSIS — M25611 Stiffness of right shoulder, not elsewhere classified: Secondary | ICD-10-CM

## 2017-10-11 NOTE — Therapy (Signed)
[place], [place], [address] Phone: [phone]   Fax:  [phone]  Physical Therapy Treatment  Patient Details  Name: Misty Lynch MRN: [id number] Date of Birth: [birth year] Referring Provider: Dr. Iran Planas    Encounter Date: 10/11/2017  PT End of Session - 10/11/17 0846    Visit Number  2    Number of Visits  9    Date for PT Re-Evaluation  11/05/17    PT Start Time  0805    PT Stop Time  0846    PT Time Calculation (min)  41 min    Activity Tolerance  Patient tolerated treatment well    Behavior During Therapy  [facility] for tasks assessed/performed       Past Medical History:  Diagnosis Date  . Anxiety   . Cancer ([place]) 06/2017   left breast cancer  . Depression   . Heart murmur   . Hypertension   . Snores     Past Surgical History:  Procedure Laterality Date  . BREAST RECONSTRUCTION WITH PLACEMENT OF TISSUE EXPANDER AND FLEX HD (ACELLULAR HYDRATED DERMIS) Bilateral 08/23/2017   Procedure: BREAST RECONSTRUCTION WITH PLACEMENT OF TISSUE EXPANDER AND Alloderm (ACELLULAR HYDRATED DERMIS);  Surgeon: [name], MD;  Location: [place];  Service: Plastics;  Laterality: Bilateral;  . COLONOSCOPY    . DILATION AND CURETTAGE OF UTERUS    . NIPPLE SPARING MASTECTOMY/SENTINAL LYMPH NODE BIOPSY/RECONSTRUCTION/PLACEMENT OF TISSUE EXPANDER Bilateral 08/23/2017   Procedure: BILATERAL NIPPLE SPARING MASTECTOMIES WITH LEFT SENTINEL LYMPH NODE BIOPSY;  Surgeon: Excell Seltzer, MD;  Location: [place];  Service: General;  Laterality: Bilateral;  . REMOVAL OF TISSUE EXPANDER AND PLACEMENT OF IMPLANT Bilateral 09/17/2017   Procedure: REMOVAL OF RIGHT TISSUE EXPANDER, LEFT CHEST TISSUE EXPANSION;  Surgeon: [name], MD;  Location: [place];  Service: Plastics;  Laterality: Bilateral;  . TUBAL LIGATION      There were no vitals filed for this  visit.  Subjective Assessment - 10/11/17 0803    Subjective  My tightness is ok. I wasn't a very good person this past week with doing the exercises.     Pertinent History  Patient was diagnosed on 06/24/17 with left grade 1-2 invasive lobular carcinoma breast cancer. There are 2 areas of cancer located in the upper inner quadrant and upper outer quadrant which is an area of distortion with calcs. It is ER/PR positive and HER2 negative with a Ki67 of 2%. She had physical therapy earlier this year (patient estimated she had 8 visits) for left shoulder pain and stiffness which she reports is still an issue for her at times.  Pt under double mastectomy ( 4 lymph nodes removed on the left ) on 08/23/2017 with immediate expander placement.  She had a staph infection on right side and had expander removal on 09/17/2017.There is no expander on the right but will have expander replaced in January. She will have implant sometime after April  She will no radiation or chemo.     Patient Stated Goals  wants to help her posture     Currently in Pain?  No/denies                      [facility] Adult PT Treatment/Exercise - 10/11/17 0001      Shoulder Exercises: Supine   Flexion  AAROM;Both;10 reps with dowel rod with 10 sec hold    ABduction  AAROM;Both;10 reps with dowel  with 10 sec hold      Manual Therapy   Manual Therapy  Passive ROM    Passive ROM  to bilateral shoulders in direction of flexion, abduction, and ER to pt's tolerance                  PT Long Term Goals - 10/03/17 1748      PT LONG TERM GOAL #1   Title  Pt will verbalize lymphedema risk reduction practices     Time  4    Period  Weeks    Status  New      PT LONG TERM GOAL #2   Title  Pt will be independent in a home exercise program for shoulder range of motion and strength     Time  4    Period  Weeks    Status  New      PT LONG TERM GOAL #3   Title  Pt will increase AROM of left and right shoulder to 140  degrees     Baseline  12//03/2017 Lt: 136, Rt. 115     Time  4    Period  Weeks    Status  New      Breast Clinic Goals - 07/03/17 1116      Patient will be able to verbalize understanding of pertinent lymphedema risk reduction practices relevant to her diagnosis specifically related to skin care.   Time  1    Period  Days    Status  Achieved      Patient will be able to return demonstrate and/or verbalize understanding of the post-op home exercise program related to regaining shoulder range of motion.   Time  1    Period  Days    Status  Achieved      Patient will be able to verbalize understanding of the importance of attending the postoperative After Breast Cancer Class for further lymphedema risk reduction education and therapeutic exercise.   Time  1    Period  Days    Status  Achieved           Plan - 10/11/17 0846    Clinical Impression Statement  Signed pt up for ABC class on Monday. Instructed pt in lymphedema risk reduction practices. Re educated pt in supine dowel exercises. Focused today on PROM to bilateral shoulders to increase ROM.     Rehab Potential  Good    Clinical Impairments Affecting Rehab Potential  Pre-existing shoulder dysfunction    PT Frequency  2x / week    PT Duration  4 weeks    PT Treatment/Interventions  Patient/family education;Therapeutic exercise;ADLs/Self Care Home Management;Scar mobilization;Passive range of motion;Taping;Manual techniques;Moist Heat;Therapeutic activities;DME Instruction;Manual lymph drainage;Compression bandaging    PT Next Visit Plan  Progress shoulder AA/AROM and HEP. manual techniques to scar at right chest, pulleys, ball up wall    PT Home Exercise Plan  Post op shoulder ROM HEP    Consulted and Agree with Plan of Care  Patient       Patient will benefit from skilled therapeutic intervention in order to improve the following deficits and impairments:  Postural dysfunction, Decreased knowledge of precautions, Pain,  Impaired UE functional use, Decreased range of motion, Decreased skin integrity, Decreased scar mobility, Decreased strength, Increased fascial restricitons  Visit Diagnosis: Stiffness of right shoulder, not elsewhere classified  Stiffness of left shoulder, not elsewhere classified  Abnormal posture     Problem List Patient Active Problem List  Diagnosis Date Noted  . Breast cancer, left breast ([place]) 08/23/2017  . Malignant neoplasm of upper-outer quadrant of left breast in female, estrogen receptor positive ([place]) 07/02/2017    Allyson Sabal [facility] 10/11/2017, 8:51 AM  [place], [place], [address] Phone: [phone]   Fax:  [phone]  Name: TWANDA STAKES MRN: [id number] Date of Birth: 1956-07-09  Manus Gunning, PT 10/11/17 8:51 AM

## 2017-10-14 ENCOUNTER — Encounter: Payer: Self-pay | Admitting: Physical Therapy

## 2017-10-14 ENCOUNTER — Ambulatory Visit: Payer: 59 | Admitting: Physical Therapy

## 2017-10-14 DIAGNOSIS — M6281 Muscle weakness (generalized): Secondary | ICD-10-CM

## 2017-10-14 DIAGNOSIS — R293 Abnormal posture: Secondary | ICD-10-CM

## 2017-10-14 DIAGNOSIS — Z483 Aftercare following surgery for neoplasm: Secondary | ICD-10-CM | POA: Diagnosis not present

## 2017-10-14 DIAGNOSIS — M25611 Stiffness of right shoulder, not elsewhere classified: Secondary | ICD-10-CM

## 2017-10-14 DIAGNOSIS — M25612 Stiffness of left shoulder, not elsewhere classified: Secondary | ICD-10-CM

## 2017-10-14 NOTE — Therapy (Signed)
[place], [place], [address] Phone: [phone]   Fax:  [phone]  Physical Therapy Treatment  Patient Details  Name: Misty Lynch MRN: [id number] Date of Birth: June 17, 1956 Referring Provider: Dr. Iran Planas    Encounter Date: 10/14/2017  PT End of Session - 10/14/17 1347    Visit Number  3    Number of Visits  9    Date for PT Re-Evaluation  11/05/17    PT Start Time  8756    PT Stop Time  1347    PT Time Calculation (min)  44 min    Activity Tolerance  Patient tolerated treatment well    Behavior During Therapy  [facility] for tasks assessed/performed       Past Medical History:  Diagnosis Date  . Anxiety   . Cancer ([place]) 06/2017   left breast cancer  . Depression   . Heart murmur   . Hypertension   . Snores     Past Surgical History:  Procedure Laterality Date  . BREAST RECONSTRUCTION WITH PLACEMENT OF TISSUE EXPANDER AND FLEX HD (ACELLULAR HYDRATED DERMIS) Bilateral 08/23/2017   Procedure: BREAST RECONSTRUCTION WITH PLACEMENT OF TISSUE EXPANDER AND Alloderm (ACELLULAR HYDRATED DERMIS);  Surgeon: [name], MD;  Location: [place];  Service: Plastics;  Laterality: Bilateral;  . COLONOSCOPY    . DILATION AND CURETTAGE OF UTERUS    . NIPPLE SPARING MASTECTOMY/SENTINAL LYMPH NODE BIOPSY/RECONSTRUCTION/PLACEMENT OF TISSUE EXPANDER Bilateral 08/23/2017   Procedure: BILATERAL NIPPLE SPARING MASTECTOMIES WITH LEFT SENTINEL LYMPH NODE BIOPSY;  Surgeon: Excell Seltzer, MD;  Location: [place];  Service: General;  Laterality: Bilateral;  . REMOVAL OF TISSUE EXPANDER AND PLACEMENT OF IMPLANT Bilateral 09/17/2017   Procedure: REMOVAL OF RIGHT TISSUE EXPANDER, LEFT CHEST TISSUE EXPANSION;  Surgeon: [name], MD;  Location: [place];  Service: Plastics;  Laterality: Bilateral;  . TUBAL LIGATION      There were no vitals filed for this  visit.  Subjective Assessment - 10/14/17 1303    Subjective  My shoulders felt good after last session. My wound is still healing. It is looking better.     Pertinent History  Patient was diagnosed on 06/24/17 with left grade 1-2 invasive lobular carcinoma breast cancer. There are 2 areas of cancer located in the upper inner quadrant and upper outer quadrant which is an area of distortion with calcs. It is ER/PR positive and HER2 negative with a Ki67 of 2%. She had physical therapy earlier this year (patient estimated she had 8 visits) for left shoulder pain and stiffness which she reports is still an issue for her at times.  Pt under double mastectomy ( 4 lymph nodes removed on the left ) on 08/23/2017 with immediate expander placement.  She had a staph infection on right side and had expander removal on 09/17/2017.There is no expander on the right but will have expander replaced in January. She will have implant sometime after April  She will no radiation or chemo.     Patient Stated Goals  wants to help her posture     Currently in Pain?  No/denies         [facility] PT Assessment - 10/14/17 0001      AROM   Right Shoulder Flexion  138 Degrees    Right Shoulder ABduction  144 Degrees    Left Shoulder Flexion  158 Degrees    Left Shoulder ABduction  156 Degrees  [facility] Adult PT Treatment/Exercise - 10/14/17 0001      Shoulder Exercises: Supine   Horizontal ABduction  Strengthening;Both;10 reps;Theraband    Theraband Level (Shoulder Horizontal ABduction)  Level 1 (Yellow)    External Rotation  Strengthening;Both;10 reps;Theraband    Theraband Level (Shoulder External Rotation)  Level 1 (Yellow)    Flexion  Strengthening;Both;10 reps;Theraband narrow and wide grip    Theraband Level (Shoulder Flexion)  Level 1 (Yellow)    Other Supine Exercises  D2 with yellow theraband x 10 reps bilaterally      Shoulder Exercises: Pulleys   Flexion  2 minutes    ABduction  2  minutes      Shoulder Exercises: Therapy Ball   Flexion  10 reps    ABduction  10 reps                  PT Long Term Goals - 10/03/17 1748      PT LONG TERM GOAL #1   Title  Pt will verbalize lymphedema risk reduction practices     Time  4    Period  Weeks    Status  New      PT LONG TERM GOAL #2   Title  Pt will be independent in a home exercise program for shoulder range of motion and strength     Time  4    Period  Weeks    Status  New      PT LONG TERM GOAL #3   Title  Pt will increase AROM of left and right shoulder to 140 degrees     Baseline  12//03/2017 Lt: 136, Rt. 115     Time  4    Period  Weeks    Status  New      Breast Clinic Goals - 07/03/17 1116      Patient will be able to verbalize understanding of pertinent lymphedema risk reduction practices relevant to her diagnosis specifically related to skin care.   Time  1    Period  Days    Status  Achieved      Patient will be able to return demonstrate and/or verbalize understanding of the post-op home exercise program related to regaining shoulder range of motion.   Time  1    Period  Days    Status  Achieved      Patient will be able to verbalize understanding of the importance of attending the postoperative After Breast Cancer Class for further lymphedema risk reduction education and therapeutic exercise.   Time  1    Period  Days    Status  Achieved           Plan - 10/14/17 1329    Clinical Impression Statement  Pt attended ABC class this morning. Instructed pt in ball up wall and pulleys today. Also issued supine scapular exercises. Pt demonstrates significantly improved flexion and abduction ROM bilaterally. Will continue to focus more on strength as ROM continues to improve.     Rehab Potential  Good    Clinical Impairments Affecting Rehab Potential  Pre-existing shoulder dysfunction    PT Frequency  2x / week    PT Duration  4 weeks    PT Treatment/Interventions  Patient/family  education;Therapeutic exercise;ADLs/Self Care Home Management;Scar mobilization;Passive range of motion;Taping;Manual techniques;Moist Heat;Therapeutic activities;DME Instruction;Manual lymph drainage;Compression bandaging    PT Next Visit Plan  Progress shoulder AA/AROM and HEP. manual techniques to scar at right chest, pulleys, ball up wall,  assess indep with supine scap exercises    PT Home Exercise Plan  Post op shoulder ROM HEP, supine scap    Consulted and Agree with Plan of Care  Patient       Patient will benefit from skilled therapeutic intervention in order to improve the following deficits and impairments:  Postural dysfunction, Decreased knowledge of precautions, Pain, Impaired UE functional use, Decreased range of motion, Decreased skin integrity, Decreased scar mobility, Decreased strength, Increased fascial restricitons  Visit Diagnosis: Stiffness of right shoulder, not elsewhere classified  Stiffness of left shoulder, not elsewhere classified  Abnormal posture  Muscle weakness (generalized)     Problem List Patient Active Problem List   Diagnosis Date Noted  . Breast cancer, left breast ([place]) 08/23/2017  . Malignant neoplasm of upper-outer quadrant of left breast in female, estrogen receptor positive ([place]) 07/02/2017    Allyson Sabal [facility] 10/14/2017, 1:47 PM  [place], [place], [address] Phone: [phone]   Fax:  [phone]  Name: Misty Lynch MRN: [id number] Date of Birth: 11-Mar-1956  Manus Gunning, PT 10/14/17 1:48 PM

## 2017-10-14 NOTE — Patient Instructions (Signed)

## 2017-10-17 ENCOUNTER — Ambulatory Visit: Payer: 59

## 2017-10-17 DIAGNOSIS — R293 Abnormal posture: Secondary | ICD-10-CM

## 2017-10-17 DIAGNOSIS — Z483 Aftercare following surgery for neoplasm: Secondary | ICD-10-CM | POA: Diagnosis not present

## 2017-10-17 DIAGNOSIS — M25611 Stiffness of right shoulder, not elsewhere classified: Secondary | ICD-10-CM

## 2017-10-17 DIAGNOSIS — M6281 Muscle weakness (generalized): Secondary | ICD-10-CM

## 2017-10-17 DIAGNOSIS — M25612 Stiffness of left shoulder, not elsewhere classified: Secondary | ICD-10-CM

## 2017-10-17 NOTE — Patient Instructions (Signed)

## 2017-10-17 NOTE — Therapy (Signed)
[place], [place], [address] Phone: [phone]   Fax:  [phone]  Physical Therapy Treatment  Patient Details  Name: Misty Lynch MRN: [id number] Date of Birth: [birth year] Referring Provider: Dr. Iran Planas    Encounter Date: 10/17/2017  PT End of Session - 10/17/17 0929    Visit Number  4    Number of Visits  9    Date for PT Re-Evaluation  11/05/17    PT Start Time  0908 Pt arrived late    PT Stop Time  0936    PT Time Calculation (min)  28 min    Activity Tolerance  Patient tolerated treatment well    Behavior During Therapy  [facility] for tasks assessed/performed       Past Medical History:  Diagnosis Date  . Anxiety   . Cancer ([place]) 06/2017   left breast cancer  . Depression   . Heart murmur   . Hypertension   . Snores     Past Surgical History:  Procedure Laterality Date  . BREAST RECONSTRUCTION WITH PLACEMENT OF TISSUE EXPANDER AND FLEX HD (ACELLULAR HYDRATED DERMIS) Bilateral 08/23/2017   Procedure: BREAST RECONSTRUCTION WITH PLACEMENT OF TISSUE EXPANDER AND Alloderm (ACELLULAR HYDRATED DERMIS);  Surgeon: [name], MD;  Location: [place];  Service: Plastics;  Laterality: Bilateral;  . COLONOSCOPY    . DILATION AND CURETTAGE OF UTERUS    . NIPPLE SPARING MASTECTOMY/SENTINAL LYMPH NODE BIOPSY/RECONSTRUCTION/PLACEMENT OF TISSUE EXPANDER Bilateral 08/23/2017   Procedure: BILATERAL NIPPLE SPARING MASTECTOMIES WITH LEFT SENTINEL LYMPH NODE BIOPSY;  Surgeon: Excell Seltzer, MD;  Location: [place];  Service: General;  Laterality: Bilateral;  . REMOVAL OF TISSUE EXPANDER AND PLACEMENT OF IMPLANT Bilateral 09/17/2017   Procedure: REMOVAL OF RIGHT TISSUE EXPANDER, LEFT CHEST TISSUE EXPANSION;  Surgeon: [name], MD;  Location: [place];  Service: Plastics;  Laterality: Bilateral;  . TUBAL LIGATION      There were no vitals filed for this  visit.  Subjective Assessment - 10/17/17 0909    Subjective  My shoulders are doing well and nothing is limiting me now.     Pertinent History  Patient was diagnosed on 06/24/17 with left grade 1-2 invasive lobular carcinoma breast cancer. There are 2 areas of cancer located in the upper inner quadrant and upper outer quadrant which is an area of distortion with calcs. It is ER/PR positive and HER2 negative with a Ki67 of 2%. She had physical therapy earlier this year (patient estimated she had 8 visits) for left shoulder pain and stiffness which she reports is still an issue for her at times.  Pt under double mastectomy ( 4 lymph nodes removed on the left ) on 08/23/2017 with immediate expander placement.  She had a staph infection on right side and had expander removal on 09/17/2017.There is no expander on the right but will have expander replaced in January. She will have implant sometime after April  She will no radiation or chemo.     Patient Stated Goals  wants to help her posture     Currently in Pain?  No/denies                      [facility] Adult PT Treatment/Exercise - 10/17/17 0001      Shoulder Exercises: Supine   Horizontal ABduction  Strengthening;Both;10 reps;Theraband    Theraband Level (Shoulder Horizontal ABduction)  Level 1 (Yellow)    External Rotation  Strengthening;Both;10 reps;Theraband    Theraband Level (Shoulder External Rotation)  Level 1 (Yellow)    Flexion  Strengthening;Both;10 reps;Theraband Narrow and Wide Grip, 10 times each    Theraband Level (Shoulder Flexion)  Level 1 (Yellow)    Other Supine Exercises  D2 with yellow theraband x 5 reps bilaterally      Shoulder Exercises: Standing   Other Standing Exercises  Standing against wall with core engaged, for back, shoulders, and head against wall for bil UE 3 way raises into flexion, scaption and abduction to shoulder height with 1 lb on each wrist 10 times each.       Shoulder Exercises: Pulleys    Flexion  2 minutes;Limitations    Flexion Limitations  Demonstration to remind pt to hold stretch and relax shoulders.     ABduction  2 minutes      Shoulder Exercises: Therapy Ball   Flexion  10 reps With forward lean into end of stretch; 1 lb each wrist    ABduction  5 reps Bil UE with side lean into end of stretch; 1 lb each wrist             PT Education - 10/17/17 0933    Education provided  Yes    Education Details  Stnading 3 way raises    Person(s) Educated  Patient    Methods  Explanation;Demonstration;Handout    Comprehension  Verbalized understanding;Returned demonstration;Need further instruction          PT Long Term Goals - 10/03/17 1748      PT LONG TERM GOAL #1   Title  Pt will verbalize lymphedema risk reduction practices     Time  4    Period  Weeks    Status  New      PT LONG TERM GOAL #2   Title  Pt will be independent in a home exercise program for shoulder range of motion and strength     Time  4    Period  Weeks    Status  New      PT LONG TERM GOAL #3   Title  Pt will increase AROM of left and right shoulder to 140 degrees     Baseline  12//03/2017 Lt: 136, Rt. 115     Time  4    Period  Weeks    Status  New      Breast Clinic Goals - 07/03/17 1116      Patient will be able to verbalize understanding of pertinent lymphedema risk reduction practices relevant to her diagnosis specifically related to skin care.   Time  1    Period  Days    Status  Achieved      Patient will be able to return demonstrate and/or verbalize understanding of the post-op home exercise program related to regaining shoulder range of motion.   Time  1    Period  Days    Status  Achieved      Patient will be able to verbalize understanding of the importance of attending the postoperative After Breast Cancer Class for further lymphedema risk reduction education and therapeutic exercise.   Time  1    Period  Days    Status  Achieved           Plan -  10/17/17 0929    Clinical Impression Statement  Pt was running late for appt today. Continued with ball up wall but added 1 lb to each wrist which she  tolerated well. Progressed HEP to include standing 3 way raises as pt tolerated these well. Also reviewed supine scapular series as pt hadn't gotten to try these at home yet.     Rehab Potential  Good    Clinical Impairments Affecting Rehab Potential  Pre-existing shoulder dysfunction    PT Frequency  2x / week    PT Duration  4 weeks    PT Treatment/Interventions  Patient/family education;Therapeutic exercise;ADLs/Self Care Home Management;Scar mobilization;Passive range of motion;Taping;Manual techniques;Moist Heat;Therapeutic activities;DME Instruction;Manual lymph drainage;Compression bandaging    PT Next Visit Plan  Progress shoulder AA/AROM and HEP. manual techniques to scar at right chest, pulleys, ball up wall, assess indep with supine scap exercises    Consulted and Agree with Plan of Care  Patient       Patient will benefit from skilled therapeutic intervention in order to improve the following deficits and impairments:  Postural dysfunction, Decreased knowledge of precautions, Pain, Impaired UE functional use, Decreased range of motion, Decreased skin integrity, Decreased scar mobility, Decreased strength, Increased fascial restricitons  Visit Diagnosis: Stiffness of right shoulder, not elsewhere classified  Stiffness of left shoulder, not elsewhere classified  Abnormal posture  Muscle weakness (generalized)  Aftercare following surgery for neoplasm     Problem List Patient Active Problem List   Diagnosis Date Noted  . Breast cancer, left breast ([place]) 08/23/2017  . Malignant neoplasm of upper-outer quadrant of left breast in female, estrogen receptor positive ([place]) 07/02/2017    Otelia Limes, PTA 10/17/2017, 9:36 AM  [place], [place], [address] Phone: [phone]   Fax:  [phone]  Name: Misty Lynch MRN: [id number] Date of Birth: [birth year]

## 2017-10-24 ENCOUNTER — Ambulatory Visit: Payer: 59

## 2017-10-24 DIAGNOSIS — M6281 Muscle weakness (generalized): Secondary | ICD-10-CM

## 2017-10-24 DIAGNOSIS — M25611 Stiffness of right shoulder, not elsewhere classified: Secondary | ICD-10-CM

## 2017-10-24 DIAGNOSIS — M25612 Stiffness of left shoulder, not elsewhere classified: Secondary | ICD-10-CM

## 2017-10-24 DIAGNOSIS — R293 Abnormal posture: Secondary | ICD-10-CM

## 2017-10-24 DIAGNOSIS — Z483 Aftercare following surgery for neoplasm: Secondary | ICD-10-CM

## 2017-10-24 NOTE — Therapy (Signed)
[place], [place], [address] Phone: [phone]   Fax:  [phone]  Physical Therapy Treatment  Patient Details  Name: Misty Lynch MRN: [id number] Date of Birth: 1955-11-28 Referring Provider: Dr. Iran Planas    Encounter Date: 10/24/2017  PT End of Session - 10/24/17 0906    Visit Number  5    Number of Visits  9    Date for PT Re-Evaluation  11/05/17    PT Start Time  0853    PT Stop Time  0935    PT Time Calculation (min)  42 min    Activity Tolerance  Patient tolerated treatment well    Behavior During Therapy  [facility] for tasks assessed/performed       Past Medical History:  Diagnosis Date  . Anxiety   . Cancer ([place]) 06/2017   left breast cancer  . Depression   . Heart murmur   . Hypertension   . Snores     Past Surgical History:  Procedure Laterality Date  . BREAST RECONSTRUCTION WITH PLACEMENT OF TISSUE EXPANDER AND FLEX HD (ACELLULAR HYDRATED DERMIS) Bilateral 08/23/2017   Procedure: BREAST RECONSTRUCTION WITH PLACEMENT OF TISSUE EXPANDER AND Alloderm (ACELLULAR HYDRATED DERMIS);  Surgeon: [name], MD;  Location: [place];  Service: Plastics;  Laterality: Bilateral;  . COLONOSCOPY    . DILATION AND CURETTAGE OF UTERUS    . NIPPLE SPARING MASTECTOMY/SENTINAL LYMPH NODE BIOPSY/RECONSTRUCTION/PLACEMENT OF TISSUE EXPANDER Bilateral 08/23/2017   Procedure: BILATERAL NIPPLE SPARING MASTECTOMIES WITH LEFT SENTINEL LYMPH NODE BIOPSY;  Surgeon: Excell Seltzer, MD;  Location: [place];  Service: General;  Laterality: Bilateral;  . REMOVAL OF TISSUE EXPANDER AND PLACEMENT OF IMPLANT Bilateral 09/17/2017   Procedure: REMOVAL OF RIGHT TISSUE EXPANDER, LEFT CHEST TISSUE EXPANSION;  Surgeon: [name], MD;  Location: [place];  Service: Plastics;  Laterality: Bilateral;  . TUBAL LIGATION      There were no vitals filed for this  visit.  Subjective Assessment - 10/24/17 0857    Subjective  Overall I feel like I'm doing well except for the tightnes at my chest that I know is just from the expanders.     Pertinent History  Patient was diagnosed on 06/24/17 with left grade 1-2 invasive lobular carcinoma breast cancer. There are 2 areas of cancer located in the upper inner quadrant and upper outer quadrant which is an area of distortion with calcs. It is ER/PR positive and HER2 negative with a Ki67 of 2%. She had physical therapy earlier this year (patient estimated she had 8 visits) for left shoulder pain and stiffness which she reports is still an issue for her at times.  Pt under double mastectomy ( 4 lymph nodes removed on the left ) on 08/23/2017 with immediate expander placement.  She had a staph infection on right side and had expander removal on 09/17/2017.There is no expander on the right but will have expander replaced in January. She will have implant sometime after April  She will no radiation or chemo.     Patient Stated Goals  wants to help her posture     Currently in Pain?  No/denies                      [facility] Adult PT Treatment/Exercise - 10/24/17 0001      Shoulder Exercises: Pulleys   Flexion  2 minutes    ABduction  2 minutes  Shoulder Exercises: Therapy Ball   Flexion  10 reps With forward lean into end of stretch; 1 lb each wrist    ABduction  10 reps Bil UE's with side lean into end of stretch; 1 lb each wrist      Manual Therapy   Manual Therapy  Passive ROM;Myofascial release    Myofascial Release  To Lt chest area during P/ROM    Passive ROM  to bilateral shoulders in direction of flexion, abduction, and ER to pt's tolerance                  PT Long Term Goals - 10/03/17 1748      PT LONG TERM GOAL #1   Title  Pt will verbalize lymphedema risk reduction practices     Time  4    Period  Weeks    Status  New      PT LONG TERM GOAL #2   Title  Pt will be  independent in a home exercise program for shoulder range of motion and strength     Time  4    Period  Weeks    Status  New      PT LONG TERM GOAL #3   Title  Pt will increase AROM of left and right shoulder to 140 degrees     Baseline  12//03/2017 Lt: 136, Rt. 115     Time  4    Period  Weeks    Status  New      Breast Clinic Goals - 07/03/17 1116      Patient will be able to verbalize understanding of pertinent lymphedema risk reduction practices relevant to her diagnosis specifically related to skin care.   Time  1    Period  Days    Status  Achieved      Patient will be able to return demonstrate and/or verbalize understanding of the post-op home exercise program related to regaining shoulder range of motion.   Time  1    Period  Days    Status  Achieved      Patient will be able to verbalize understanding of the importance of attending the postoperative After Breast Cancer Class for further lymphedema risk reduction education and therapeutic exercise.   Time  1    Period  Days    Status  Achieved           Plan - 10/24/17 0909    Clinical Impression Statement  Pts biggest complaint today was and is tightness still at chest wall so focused on manual therapy today with stretching and myofascial release which pt tolerated very well. Only did myofascial release to Lt chest wall as incision is still healing on Rt.     Rehab Potential  Good    Clinical Impairments Affecting Rehab Potential  Pre-existing shoulder dysfunction    PT Frequency  2x / week    PT Duration  4 weeks    PT Treatment/Interventions  Patient/family education;Therapeutic exercise;ADLs/Self Care Home Management;Scar mobilization;Passive range of motion;Taping;Manual techniques;Moist Heat;Therapeutic activities;DME Instruction;Manual lymph drainage;Compression bandaging    PT Next Visit Plan  Progress shoulder AA/AROM and HEP. manual techniques to scar at right chest, pulleys, ball up wall, assess indep  with supine scap exercises    Consulted and Agree with Plan of Care  Patient       Patient will benefit from skilled therapeutic intervention in order to improve the following deficits and impairments:  Postural dysfunction, Decreased knowledge  of precautions, Pain, Impaired UE functional use, Decreased range of motion, Decreased skin integrity, Decreased scar mobility, Decreased strength, Increased fascial restricitons  Visit Diagnosis: Stiffness of right shoulder, not elsewhere classified  Stiffness of left shoulder, not elsewhere classified  Abnormal posture  Muscle weakness (generalized)  Aftercare following surgery for neoplasm     Problem List Patient Active Problem List   Diagnosis Date Noted  . Breast cancer, left breast ([place]) 08/23/2017  . Malignant neoplasm of upper-outer quadrant of left breast in female, estrogen receptor positive ([place]) 07/02/2017    Otelia Limes, PTA 10/24/2017, 9:37 AM  [place], [place], [address] Phone: [phone]   Fax:  [phone]  Name: Misty Lynch MRN: [id number] Date of Birth: 02-21-1956

## 2017-10-25 ENCOUNTER — Ambulatory Visit: Payer: 59 | Admitting: Physical Therapy

## 2017-10-25 DIAGNOSIS — M25611 Stiffness of right shoulder, not elsewhere classified: Secondary | ICD-10-CM

## 2017-10-25 DIAGNOSIS — Z483 Aftercare following surgery for neoplasm: Secondary | ICD-10-CM | POA: Diagnosis not present

## 2017-10-25 DIAGNOSIS — M25612 Stiffness of left shoulder, not elsewhere classified: Secondary | ICD-10-CM

## 2017-10-25 DIAGNOSIS — R293 Abnormal posture: Secondary | ICD-10-CM

## 2017-10-25 NOTE — Therapy (Signed)
[place], [place], [address] Phone: [phone]   Fax:  [phone]  Physical Therapy Treatment  Patient Details  Name: Misty Lynch MRN: [id number] Date of Birth: Mar 10, 1956 Referring Provider: Dr. Iran Planas    Encounter Date: 10/25/2017  PT End of Session - 10/25/17 1037    Visit Number  6    Number of Visits  9    Date for PT Re-Evaluation  11/05/17    PT Start Time  0848    PT Stop Time  0931    PT Time Calculation (min)  43 min    Activity Tolerance  Patient tolerated treatment well    Behavior During Therapy  [facility] for tasks assessed/performed       Past Medical History:  Diagnosis Date  . Anxiety   . Cancer ([place]) 06/2017   left breast cancer  . Depression   . Heart murmur   . Hypertension   . Snores     Past Surgical History:  Procedure Laterality Date  . BREAST RECONSTRUCTION WITH PLACEMENT OF TISSUE EXPANDER AND FLEX HD (ACELLULAR HYDRATED DERMIS) Bilateral 08/23/2017   Procedure: BREAST RECONSTRUCTION WITH PLACEMENT OF TISSUE EXPANDER AND Alloderm (ACELLULAR HYDRATED DERMIS);  Surgeon: [name], MD;  Location: [place];  Service: Plastics;  Laterality: Bilateral;  . COLONOSCOPY    . DILATION AND CURETTAGE OF UTERUS    . NIPPLE SPARING MASTECTOMY/SENTINAL LYMPH NODE BIOPSY/RECONSTRUCTION/PLACEMENT OF TISSUE EXPANDER Bilateral 08/23/2017   Procedure: BILATERAL NIPPLE SPARING MASTECTOMIES WITH LEFT SENTINEL LYMPH NODE BIOPSY;  Surgeon: Excell Seltzer, MD;  Location: [place];  Service: General;  Laterality: Bilateral;  . REMOVAL OF TISSUE EXPANDER AND PLACEMENT OF IMPLANT Bilateral 09/17/2017   Procedure: REMOVAL OF RIGHT TISSUE EXPANDER, LEFT CHEST TISSUE EXPANSION;  Surgeon: [name], MD;  Location: [place];  Service: Plastics;  Laterality: Bilateral;  . TUBAL LIGATION      There were no vitals filed for this  visit.  Subjective Assessment - 10/25/17 0850    Subjective  Things are good.  No issues. I saw Dr. Iran Planas Wednesday and she said the right chest is "getting close." Hasn't been doing the exercises as much as she should because of the holidays.    Pertinent History  Patient was diagnosed on 06/24/17 with left grade 1-2 invasive lobular carcinoma breast cancer. There are 2 areas of cancer located in the upper inner quadrant and upper outer quadrant which is an area of distortion with calcs. It is ER/PR positive and HER2 negative with a Ki67 of 2%. She had physical therapy earlier this year (patient estimated she had 8 visits) for left shoulder pain and stiffness which she reports is still an issue for her at times.  Pt under double mastectomy ( 4 lymph nodes removed on the left ) on 08/23/2017 with immediate expander placement.  She had a staph infection on right side and had expander removal on 09/17/2017.There is no expander on the right but will have expander replaced in January. She will have implant sometime after April  She will no radiation or chemo.     Currently in Pain?  No/denies         [facility] PT Assessment - 10/25/17 0001      AROM   Right Shoulder Flexion  140 Degrees    Right Shoulder ABduction  120 Degrees                  [facility]  Adult PT Treatment/Exercise - 10/25/17 0001      Shoulder Exercises: Supine   Other Supine Exercises  supine hooklying with arms outstretched ("T"), lower trunk rotation to each side for chest stretch x 30 seconds      Shoulder Exercises: Seated   Other Seated Exercises  backward shoulder rolls at end of session      Shoulder Exercises: Pulleys   Flexion  2 minutes    ABduction  2 minutes      Shoulder Exercises: Therapy Ball   Flexion  10 reps With forward lean into end of stretch; 1 lb each wrist    ABduction  5 reps Bil UE's with side lean into end of stretch; 1 lb each wrist      Manual Therapy   Myofascial Release  right UE  myofascial pulling with movement into abduction; same on left;     Passive ROM  to bilateral shoulders in direction of flexion, abduction, and ER to pt's tolerance                  PT Long Term Goals - 10/25/17 0851      PT LONG TERM GOAL #1   Title  Pt will verbalize lymphedema risk reduction practices     Status  Partially Met      PT LONG TERM GOAL #2   Title  Pt will be independent in a home exercise program for shoulder range of motion and strength     Status  Partially Met      PT LONG TERM GOAL #3   Title  Pt will increase AROM of left and right shoulder to 140 degrees     Status  On-going      Breast Clinic Goals - 07/03/17 1116      Patient will be able to verbalize understanding of pertinent lymphedema risk reduction practices relevant to her diagnosis specifically related to skin care.   Time  1    Period  Days    Status  Achieved      Patient will be able to return demonstrate and/or verbalize understanding of the post-op home exercise program related to regaining shoulder range of motion.   Time  1    Period  Days    Status  Achieved      Patient will be able to verbalize understanding of the importance of attending the postoperative After Breast Cancer Class for further lymphedema risk reduction education and therapeutic exercise.   Time  1    Period  Days    Status  Achieved           Plan - 10/25/17 1037    Clinical Impression Statement  Pt. found some of the stretching to be vigorous, but she felt good at end of session.     Rehab Potential  Good    Clinical Impairments Affecting Rehab Potential  Pre-existing shoulder dysfunction    PT Frequency  2x / week    PT Duration  4 weeks    PT Treatment/Interventions  Patient/family education;Therapeutic exercise;ADLs/Self Care Home Management;Scar mobilization;Passive range of motion;Taping;Manual techniques;Moist Heat;Therapeutic activities;DME Instruction;Manual lymph drainage;Compression  bandaging    PT Next Visit Plan  Progress shoulder AA/AROM and HEP. manual techniques to scar at right chest, pulleys, ball up wall, assess indep with supine scap exercises    PT Home Exercise Plan  Post op shoulder ROM HEP, supine scap    Consulted and Agree with Plan of Care  Patient  Patient will benefit from skilled therapeutic intervention in order to improve the following deficits and impairments:  Postural dysfunction, Decreased knowledge of precautions, Pain, Impaired UE functional use, Decreased range of motion, Decreased skin integrity, Decreased scar mobility, Decreased strength, Increased fascial restricitons  Visit Diagnosis: Stiffness of right shoulder, not elsewhere classified  Stiffness of left shoulder, not elsewhere classified  Abnormal posture  Aftercare following surgery for neoplasm     Problem List Patient Active Problem List   Diagnosis Date Noted  . Breast cancer, left breast ([place]) 08/23/2017  . Malignant neoplasm of upper-outer quadrant of left breast in female, estrogen receptor positive ([place]) 07/02/2017    [place] 10/25/2017, 10:41 AM  [place], [place], [address] Phone: [phone]   Fax:  [phone]  Name: Misty Lynch MRN: [id number] Date of Birth: Jun 20, 1956  Nainika Newlun, PT 10/25/17 10:41 AM

## 2017-10-30 ENCOUNTER — Other Ambulatory Visit: Payer: Self-pay

## 2017-10-30 ENCOUNTER — Ambulatory Visit: Payer: 59 | Attending: Plastic Surgery | Admitting: Physical Therapy

## 2017-10-30 ENCOUNTER — Encounter: Payer: Self-pay | Admitting: Physical Therapy

## 2017-10-30 DIAGNOSIS — R222 Localized swelling, mass and lump, trunk: Secondary | ICD-10-CM | POA: Diagnosis present

## 2017-10-30 DIAGNOSIS — M6281 Muscle weakness (generalized): Secondary | ICD-10-CM | POA: Insufficient documentation

## 2017-10-30 DIAGNOSIS — R293 Abnormal posture: Secondary | ICD-10-CM | POA: Diagnosis present

## 2017-10-30 DIAGNOSIS — M25611 Stiffness of right shoulder, not elsewhere classified: Secondary | ICD-10-CM | POA: Insufficient documentation

## 2017-10-30 DIAGNOSIS — M25612 Stiffness of left shoulder, not elsewhere classified: Secondary | ICD-10-CM | POA: Insufficient documentation

## 2017-10-30 DIAGNOSIS — Z483 Aftercare following surgery for neoplasm: Secondary | ICD-10-CM | POA: Diagnosis present

## 2017-10-30 NOTE — Therapy (Signed)
[place], [place], [address] Phone: [phone]   Fax:  [phone]  Physical Therapy Treatment  Patient Details  Name: Misty Lynch MRN: [id number] Date of Birth: November 19, 1955 Referring Provider: Dr. Iran Planas    Encounter Date: 10/30/2017  PT End of Session - 10/30/17 0946    Visit Number  7    Number of Visits  9    Date for PT Re-Evaluation  11/05/17    PT Start Time  0930    PT Stop Time  1015    PT Time Calculation (min)  45 min    Activity Tolerance  Patient tolerated treatment well    Behavior During Therapy  [facility] for tasks assessed/performed       Past Medical History:  Diagnosis Date  . Anxiety   . Cancer ([place]) 06/2017   left breast cancer  . Depression   . Heart murmur   . Hypertension   . Snores     Past Surgical History:  Procedure Laterality Date  . BREAST RECONSTRUCTION WITH PLACEMENT OF TISSUE EXPANDER AND FLEX HD (ACELLULAR HYDRATED DERMIS) Bilateral 08/23/2017   Procedure: BREAST RECONSTRUCTION WITH PLACEMENT OF TISSUE EXPANDER AND Alloderm (ACELLULAR HYDRATED DERMIS);  Surgeon: [name], MD;  Location: [place];  Service: Plastics;  Laterality: Bilateral;  . COLONOSCOPY    . DILATION AND CURETTAGE OF UTERUS    . NIPPLE SPARING MASTECTOMY/SENTINAL LYMPH NODE BIOPSY/RECONSTRUCTION/PLACEMENT OF TISSUE EXPANDER Bilateral 08/23/2017   Procedure: BILATERAL NIPPLE SPARING MASTECTOMIES WITH LEFT SENTINEL LYMPH NODE BIOPSY;  Surgeon: Excell Seltzer, MD;  Location: [facility];  Service: General;  Laterality: Bilateral;  . REMOVAL OF TISSUE EXPANDER AND PLACEMENT OF IMPLANT Bilateral 09/17/2017   Procedure: REMOVAL OF RIGHT TISSUE EXPANDER, LEFT CHEST TISSUE EXPANSION;  Surgeon: [name], MD;  Location: [facility];  Service: Plastics;  Laterality: Bilateral;  . TUBAL LIGATION      There were no vitals filed for this visit.  Subjective  Assessment - 10/30/17 0933    Subjective  "Its not so much that it feels tight... I know that there is stuff there "    She says she may be getting her expander next month.     Pertinent History  Patient was diagnosed on 06/24/17 with left grade 1-2 invasive lobular carcinoma breast cancer. There are 2 areas of cancer located in the upper inner quadrant and upper outer quadrant which is an area of distortion with calcs. It is ER/PR positive and HER2 negative with a Ki67 of 2%. She had physical therapy earlier this year (patient estimated she had 8 visits) for left shoulder pain and stiffness which she reports is still an issue for her at times.  Pt under double mastectomy ( 4 lymph nodes removed on the left ) on 08/23/2017 with immediate expander placement.  She had a staph infection on right side and had expander removal on 09/17/2017.There is no expander on the right but will have expander replaced in January. She will have implant sometime after April  She will no radiation or chemo.     Patient Stated Goals  wants to help her posture     Currently in Pain?  No/denies         [facility] PT Assessment - 10/30/17 0001      AROM   Right Shoulder Flexion  150 Degrees    Right Shoulder ABduction  150 Degrees  [facility] Adult PT Treatment/Exercise - 10/30/17 0001      Shoulder Exercises: Supine   Protraction  AROM;Right;10 reps    Horizontal ABduction  Strengthening;Both;10 reps;Theraband    Theraband Level (Shoulder Horizontal ABduction)  Level 1 (Yellow)    External Rotation  Strengthening;Both;10 reps;Theraband    Theraband Level (Shoulder External Rotation)  Level 1 (Yellow)    Flexion  Strengthening;Both;10 reps;Theraband Narrow and Wide Grip, 10 times each    Theraband Level (Shoulder Flexion)  Level 1 (Yellow)    Other Supine Exercises  D2 with yellow theraband x 5 reps bilaterally    Other Supine Exercises  supine hooklying with arms outstretched ("T"), lower trunk  rotation to each side for chest stretch x 30 seconds      Shoulder Exercises: Pulleys   Flexion  2 minutes    ABduction  2 minutes      Shoulder Exercises: Therapy Ball   Flexion  10 reps With forward lean into end of stretch; 1 lb each wrist      Manual Therapy   Manual Therapy  Taping    Kinesiotex  Edema      Kinesiotix   Edema  skinkote to right chest area and fan shape with 4 strips from over full area around scar to right lower abdomen              PT Education - 10/30/17 1212    Education provided  Yes    Education Details  Reviewed supine scapular series and how to prgress to red theraband and to sitting and standing    Person(s) Educated  Patient    Methods  Explanation;Demonstration;Handout    Comprehension  Verbalized understanding;Returned demonstration          PT Long Term Goals - 10/30/17 1219      PT LONG TERM GOAL #1   Title  Pt will verbalize lymphedema risk reduction practices     Time  4    Period  Weeks    Status  Partially Met      PT LONG TERM GOAL #2   Title  Pt will be independent in a home exercise program for shoulder range of motion and strength     Time  4    Period  Weeks    Status  Partially Met      PT LONG TERM GOAL #3   Title  Pt will increase AROM of left and right shoulder to 140 degrees     Baseline  12//03/2017 Lt: 136, Rt. 115 , Rt and Lt 150 degrees in flexion and abduction     Status  Achieved      Breast Clinic Goals - 07/03/17 1116      Patient will be able to verbalize understanding of pertinent lymphedema risk reduction practices relevant to her diagnosis specifically related to skin care.   Time  1    Period  Days    Status  Achieved      Patient will be able to return demonstrate and/or verbalize understanding of the post-op home exercise program related to regaining shoulder range of motion.   Time  1    Period  Days    Status  Achieved      Patient will be able to verbalize understanding of the  importance of attending the postoperative After Breast Cancer Class for further lymphedema risk reduction education and therapeutic exercise.   Time  1    Period  Days      Status  Achieved           Plan - 10/30/17 1214    Clinical Impression Statement  Pt is doing well and and has made progress toward her range of motion goals. She is working on increasing her strength and plans to be more diligent with exercise now that the holidays are over. She continues to have tightness at right chest incision.  Tried kinesiotape today to see if this will help her.     Clinical Impairments Affecting Rehab Potential  Pre-existing shoulder dysfunction    PT Frequency  2x / week    PT Duration  4 weeks    PT Next Visit Plan  Progress shoulder AA/AROM and HEP. manual techniques to scar at right chest, pulleys, ball up wall, assess indep with supine scap exercises  Pt may be ready to discharge next visit versus continuing PT to work more on chest mobilty .  If so, send renewal     PT Home Exercise Plan  Post op shoulder ROM HEP, supine scap       Patient will benefit from skilled therapeutic intervention in order to improve the following deficits and impairments:  Postural dysfunction, Decreased knowledge of precautions, Pain, Impaired UE functional use, Decreased range of motion, Decreased skin integrity, Decreased scar mobility, Decreased strength, Increased fascial restricitons  Visit Diagnosis: Stiffness of right shoulder, not elsewhere classified  Stiffness of left shoulder, not elsewhere classified  Abnormal posture  Aftercare following surgery for neoplasm  Muscle weakness (generalized)     Problem List Patient Active Problem List   Diagnosis Date Noted  . Breast cancer, left breast ([facility]) 08/23/2017  . Malignant neoplasm of upper-outer quadrant of left breast in female, estrogen receptor positive ([facility]) 07/02/2017   Teresa K. Brown, PT  Brown, Teresa Krall 10/30/2017, 12:35  PM  [facility] [address] [place], , [address] Phone: [phone]   Fax:  [phone]  Name: Deshanta M Pistilli MRN: [id number] Date of Birth: 10/30/1955   

## 2017-10-30 NOTE — Patient Instructions (Signed)

## 2017-11-01 ENCOUNTER — Ambulatory Visit: Payer: 59 | Admitting: Physical Therapy

## 2017-11-01 ENCOUNTER — Encounter: Payer: Self-pay | Admitting: Physical Therapy

## 2017-11-01 DIAGNOSIS — R222 Localized swelling, mass and lump, trunk: Secondary | ICD-10-CM

## 2017-11-01 DIAGNOSIS — M25611 Stiffness of right shoulder, not elsewhere classified: Secondary | ICD-10-CM | POA: Diagnosis not present

## 2017-11-01 DIAGNOSIS — M6281 Muscle weakness (generalized): Secondary | ICD-10-CM

## 2017-11-01 DIAGNOSIS — Z483 Aftercare following surgery for neoplasm: Secondary | ICD-10-CM

## 2017-11-01 NOTE — Therapy (Addendum)
[place], [place], [address] Phone: [phone]   Fax:  [phone]  Physical Therapy Treatment  Patient Details  Name: Misty Lynch MRN: [id number] Date of Birth: 1956-08-29 Referring Provider: Dr. Iran Planas    Encounter Date: 11/01/2017  PT End of Session - 11/01/17 1217    Visit Number  9    Number of Visits  17    Date for PT Re-Evaluation  12/02/17    PT Start Time  0930    PT Stop Time  1015    PT Time Calculation (min)  45 min    Activity Tolerance  Patient tolerated treatment well    Behavior During Therapy  [facility] for tasks assessed/performed       Past Medical History:  Diagnosis Date  . Anxiety   . Cancer ([place]) 06/2017   left breast cancer  . Depression   . Heart murmur   . Hypertension   . Snores     Past Surgical History:  Procedure Laterality Date  . BREAST RECONSTRUCTION WITH PLACEMENT OF TISSUE EXPANDER AND FLEX HD (ACELLULAR HYDRATED DERMIS) Bilateral 08/23/2017   Procedure: BREAST RECONSTRUCTION WITH PLACEMENT OF TISSUE EXPANDER AND Alloderm (ACELLULAR HYDRATED DERMIS);  Surgeon: [name], MD;  Location: [facility];  Service: Plastics;  Laterality: Bilateral;  . COLONOSCOPY    . DILATION AND CURETTAGE OF UTERUS    . NIPPLE SPARING MASTECTOMY/SENTINAL LYMPH NODE BIOPSY/RECONSTRUCTION/PLACEMENT OF TISSUE EXPANDER Bilateral 08/23/2017   Procedure: BILATERAL NIPPLE SPARING MASTECTOMIES WITH LEFT SENTINEL LYMPH NODE BIOPSY;  Surgeon: Excell Seltzer, MD;  Location: [place];  Service: General;  Laterality: Bilateral;  . REMOVAL OF TISSUE EXPANDER AND PLACEMENT OF IMPLANT Bilateral 09/17/2017   Procedure: REMOVAL OF RIGHT TISSUE EXPANDER, LEFT CHEST TISSUE EXPANSION;  Surgeon: [name], MD;  Location: [place];  Service: Plastics;  Laterality: Bilateral;  . TUBAL LIGATION      There were no vitals filed for this  visit.  Subjective Assessment - 11/01/17 1211    Subjective  "I think it helped!" Re" the kinesiotape.  Pt could move her arm easier with less pain this morning     Pertinent History  Patient was diagnosed on 06/24/17 with left grade 1-2 invasive lobular carcinoma breast cancer. There are 2 areas of cancer located in the upper inner quadrant and upper outer quadrant which is an area of distortion with calcs. It is ER/PR positive and HER2 negative with a Ki67 of 2%. She had physical therapy earlier this year (patient estimated she had 8 visits) for left shoulder pain and stiffness which she reports is still an issue for her at times.  Pt under double mastectomy ( 4 lymph nodes removed on the left ) on 08/23/2017 with immediate expander placement.  She had a staph infection on right side and had expander removal on 09/17/2017.There is no expander on the right but will have expander replaced in January. She will have implant sometime after April  She will no radiation or chemo.     Patient Stated Goals  wants to help her posture     Currently in Pain?  No/denies         [facility] PT Assessment - 11/01/17 0001      Observation/Other Assessments   Skin Integrity  small open area ( ~ 1 cm ) dry and dark red with dry skin around it. Pt reports she usually puts collagen to this area but did not today  becasue she was in a hurry                   [facility] Adult PT Treatment/Exercise - 11/01/17 0001      Shoulder Exercises: Stretch   Other Shoulder Stretches  in standing with one hand on wall and other behind head for upper body rotation and thoracic stretch     Other Shoulder Stretches  in sidelying for horizontal abdution with thoracic backward rotation to strech chest and axiila       Manual Therapy   Manual Therapy  Edema management;Soft tissue mobilization;Myofascial release;Taping    Manual therapy comments  Pt comes in wearing kinesiotape, It was removed and skin had no areas of redness      Soft tissue mobilization  scar massage and soft tissue stretching with pt in supine and sideing      Myofascial Release  to lateral scar and chest area     Kinesiotex  Edema      Kinesiotix   Edema  skinkote to right chest area and fan shape with 4 strips from over full area around scar to right lower abdomen                   PT Long Term Goals - 11/01/17 1224      PT LONG TERM GOAL #1   Title  Pt will verbalize lymphedema risk reduction practices     Time  4    Period  Weeks      PT LONG TERM GOAL #2   Title  Pt will be independent in a home exercise program for shoulder range of motion and strength     Status  Achieved      PT LONG TERM GOAL #3   Title  Pt will increase AROM of left and right shoulder to 140 degrees     Baseline  12//03/2017 Lt: 136, Rt. 115 , Rt and Lt 150 degrees in flexion and abduction     Status  Achieved      PT LONG TERM GOAL #4   Title  Pt will reports a decrease in fullness in chest and scar by 50% so she can do shoulder reaching activites without pain     Time  4    Period  Weeks    Status  New      Breast Clinic Goals - 07/03/17 1116      Patient will be able to verbalize understanding of pertinent lymphedema risk reduction practices relevant to her diagnosis specifically related to skin care.   Time  1    Period  Days    Status  Achieved      Patient will be able to return demonstrate and/or verbalize understanding of the post-op home exercise program related to regaining shoulder range of motion.   Time  1    Period  Days    Status  Achieved      Patient will be able to verbalize understanding of the importance of attending the postoperative After Breast Cancer Class for further lymphedema risk reduction education and therapeutic exercise.   Time  1    Period  Days    Status  Achieved           Plan - 11/01/17 1218    Clinical Impression Statement  Pt is doing well with range of motion and is working on exercises at  home.  She continues to have tightness in scar across chest and  has had some initial success with kinesiotape.  She would like to continue more sessions of PT to focus on loosening the scar in her chest with kinesiotape and possible use of chip pack with her pink binder. Recertification sent     Clinical Impairments Affecting Rehab Potential  Pre-existing shoulder dysfunction, delayed healing of right chest     PT Frequency  2x / week    PT Duration  4 weeks    PT Treatment/Interventions  Patient/family education;Therapeutic exercise;ADLs/Self Care Home Management;Scar mobilization;Passive range of motion;Taping;Manual techniques;Therapeutic activities;DME Instruction;Manual lymph drainage;Compression bandaging;Orthotic Fit/Training    PT Next Visit Plan  Continue with myofascial release, scar massage and stretching.  continue with kinesiotape as needed Make chip pack for pt to wear in pink binder for compression to scar.     PT Home Exercise Plan  Post op shoulder ROM HEP, supine scap    Consulted and Agree with Plan of Care  Patient       Patient will benefit from skilled therapeutic intervention in order to improve the following deficits and impairments:  Postural dysfunction, Decreased knowledge of precautions, Pain, Impaired UE functional use, Decreased range of motion, Decreased skin integrity, Decreased scar mobility, Decreased strength, Increased fascial restricitons  Visit Diagnosis: Aftercare following surgery for neoplasm - Plan: PT plan of care cert/re-cert  Muscle weakness (generalized) - Plan: PT plan of care cert/re-cert  Localized swelling, mass and lump, trunk - Plan: PT plan of care cert/re-cert     Problem List Patient Active Problem List   Diagnosis Date Noted  . Breast cancer, left breast ([place]) 08/23/2017  . Malignant neoplasm of upper-outer quadrant of left breast in female, estrogen receptor positive ([place]) 07/02/2017   Donato Heinz. Owens Shark PT  Norwood Levo 11/01/2017, 12:29 PM  [place], [place], [address] Phone: [phone]   Fax:  [phone]  Name: Misty Lynch MRN: [id number] Date of Birth: 1956/06/01  PHYSICAL THERAPY DISCHARGE SUMMARY  Visits from Start of Care: 9  Current functional level related to goals / functional outcomes: Unknown as pt has not returned to PT    Remaining deficits: Unknown   Education / Equipment: Home exercise program  Plan: Patient agrees to discharge.  Patient goals were partially met. Patient is being discharged due to not returning since the last visit.  ?????    Maudry Diego, PT 12/12/17 10:37 AM

## 2017-11-13 NOTE — H&P (Signed)
Subjective:     Patient ID: Misty Lynch is a 62 y.o. female.  HPI  12 weeks post op bilateral NSM with TE reconstruction. Developed cellulitis and drainage right chest, now 8 weeks  post op removal. Culture MSSA, completed Bactrim. Also experienced nipple necrosis on this side post debridement. Plan for replacement expander next month.  Presented following screening MMG with two areas calcifications left breast, 7.1 cm apart. Biopsy left UOQ with ILC with LCIS. Biopsy left UIQ with ILC with ADH. Both ER/PR+, Her2-. [place] axilla negative.  Final pathology right breast with ALH, flat epithelial atypia, 0/1 LN. Left breast with LCIS, no invasive tumor seen, 0/4 SLN.  Plan adjuvant antiestrogen therapy with anastrozole 1 mg p.o. daily between 5-7 years, next oncology f/u 12/2017.  Prior 38 B, happy with this. 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   Review of Systems     Objective:   Physical Exam  Cardiovascular: Normal rate, regular rhythm and normal heart sounds.   Pulmonary/Chest: Effort normal and breath sounds normal.   Chest:left soft, scar maturing, expander higher than desired on chest SN to nipple L 24 cm BW R 14  Nipple to IMF L 5 cm to inferior edge of expander, 7 cm to IMF scar Right chest IMF scar maturing intact, skin flap contracted Right chest all areas healed, no remaining nipple  Assessment:     Left breat ca UOQ ER+ (ILC), Left breast ca UIQ ER+ (ILC) S/p bilateral NSM, TE/ADM (Alloderm) reconstruction S/p removal right TE    Plan:   Pictures today. Plan replacement right TE, acellular dermis. Reviewed drain, OP surgery. Reviewed cadaver source ADM. Reviewed risks including but bit limited to bleeding, infection, anesthesia, damage to deeper structures, DVT/PE, cardiopulmonary complications, need for additional surgery.  Rx for Bactrim given;  patient states she has Norco and Robaxin at home.  Natrelle 133MX-13 T 500 ml tissue expanders placed ,  LEFT  fill volume 485 ml.   [name], MD [facility] Plastic & Reconstructive Surgery [phone], pin [phone]

## 2017-11-22 ENCOUNTER — Encounter (HOSPITAL_BASED_OUTPATIENT_CLINIC_OR_DEPARTMENT_OTHER): Payer: Self-pay | Admitting: *Deleted

## 2017-11-22 ENCOUNTER — Other Ambulatory Visit: Payer: Self-pay

## 2017-11-22 DIAGNOSIS — R21 Rash and other nonspecific skin eruption: Secondary | ICD-10-CM

## 2017-11-22 HISTORY — DX: Rash and other nonspecific skin eruption: R21

## 2017-11-22 NOTE — Pre-Procedure Instructions (Signed)
To come for BMET and to pick up Ensure pre-surgery drink 10 oz. - to drink by 0700 DOS.

## 2017-11-25 ENCOUNTER — Encounter (HOSPITAL_BASED_OUTPATIENT_CLINIC_OR_DEPARTMENT_OTHER)
Admission: RE | Admit: 2017-11-25 | Discharge: 2017-11-25 | Disposition: A | Discharge status: Home / Self Care | Payer: 59 | Source: Ambulatory Visit | Attending: Plastic Surgery | Admitting: Plastic Surgery

## 2017-11-25 DIAGNOSIS — Z01812 Encounter for preprocedural laboratory examination: Secondary | ICD-10-CM | POA: Insufficient documentation

## 2017-11-25 LAB — BASIC METABOLIC PANEL
Anion gap: 10 (ref 5–15)
BUN: 14 mg/dL (ref 6–20)
CALCIUM: 9.5 mg/dL (ref 8.9–10.3)
CO2: 28 mmol/L (ref 22–32)
CREATININE: 0.74 mg/dL (ref 0.44–1.00)
Chloride: 103 mmol/L (ref 101–111)
GFR calc non Af Amer: >60 mL/min (ref >60)
Glucose, Bld: 168 mg/dL — ABNORMAL HIGH (ref 65–99)
Potassium: 3.1 mmol/L — ABNORMAL LOW (ref 3.5–5.1)
Sodium: 141 mmol/L (ref 135–145)

## 2017-11-25 NOTE — Progress Notes (Addendum)
Ensure pre surgery drink given with instructions to complete by 0700 dos, pt verbalized understanding.  Lab results reviewed by Dr. Eligha Bridegroom, will proceed with surgery as scheduled.

## 2017-11-29 ENCOUNTER — Ambulatory Visit (HOSPITAL_BASED_OUTPATIENT_CLINIC_OR_DEPARTMENT_OTHER): Payer: 59 | Admitting: Anesthesiology

## 2017-11-29 ENCOUNTER — Encounter (HOSPITAL_BASED_OUTPATIENT_CLINIC_OR_DEPARTMENT_OTHER)
Admission: RE | Disposition: A | Discharge status: Home / Self Care | Payer: Self-pay | Source: Ambulatory Visit | Attending: Plastic Surgery

## 2017-11-29 ENCOUNTER — Other Ambulatory Visit: Payer: Self-pay

## 2017-11-29 ENCOUNTER — Ambulatory Visit (HOSPITAL_BASED_OUTPATIENT_CLINIC_OR_DEPARTMENT_OTHER)
Admission: RE | Admit: 2017-11-29 | Discharge: 2017-11-29 | Disposition: A | Discharge status: Home / Self Care | Payer: 59 | Source: Ambulatory Visit | Attending: Plastic Surgery | Admitting: Plastic Surgery

## 2017-11-29 ENCOUNTER — Encounter (HOSPITAL_BASED_OUTPATIENT_CLINIC_OR_DEPARTMENT_OTHER): Payer: Self-pay | Admitting: Anesthesiology

## 2017-11-29 DIAGNOSIS — I10 Essential (primary) hypertension: Secondary | ICD-10-CM | POA: Insufficient documentation

## 2017-11-29 DIAGNOSIS — Z421 Encounter for breast reconstruction following mastectomy: Secondary | ICD-10-CM | POA: Insufficient documentation

## 2017-11-29 DIAGNOSIS — Z87891 Personal history of nicotine dependence: Secondary | ICD-10-CM | POA: Insufficient documentation

## 2017-11-29 DIAGNOSIS — Z9013 Acquired absence of bilateral breasts and nipples: Secondary | ICD-10-CM | POA: Insufficient documentation

## 2017-11-29 DIAGNOSIS — Z853 Personal history of malignant neoplasm of breast: Secondary | ICD-10-CM | POA: Diagnosis not present

## 2017-11-29 HISTORY — DX: Dental restoration status: Z98.811

## 2017-11-29 HISTORY — PX: BREAST RECONSTRUCTION WITH PLACEMENT OF TISSUE EXPANDER AND ALLODERM: SHX6805

## 2017-11-29 HISTORY — DX: Personal history of malignant neoplasm of breast: Z85.3

## 2017-11-29 HISTORY — DX: Benign and innocent cardiac murmurs: R01.0

## 2017-11-29 HISTORY — DX: Rash and other nonspecific skin eruption: R21

## 2017-11-29 SURGERY — BREAST RECONSTRUCTION WITH PLACEMENT OF TISSUE EXPANDER AND ALLODERM
Anesthesia: General | Site: Breast | Laterality: Right

## 2017-11-29 MED ORDER — EPHEDRINE 5 MG/ML INJ
INTRAVENOUS | Status: AC
  Filled 2017-11-29: qty 10

## 2017-11-29 MED ORDER — PROMETHAZINE HCL 25 MG/ML IJ SOLN: 6.2500 mg | INTRAMUSCULAR | Status: DC | PRN

## 2017-11-29 MED ORDER — FENTANYL CITRATE (PF) 100 MCG/2ML IJ SOLN: 50.0000 ug | INTRAMUSCULAR | Status: DC | PRN

## 2017-11-29 MED ORDER — DEXAMETHASONE SODIUM PHOSPHATE 4 MG/ML IJ SOLN
INTRAMUSCULAR | Status: DC | PRN
  Administered 2017-11-29: 10 mg via INTRAVENOUS

## 2017-11-29 MED ORDER — LIDOCAINE HCL (CARDIAC) 20 MG/ML IV SOLN
INTRAVENOUS | Status: DC | PRN
  Administered 2017-11-29: 30 mg via INTRAVENOUS

## 2017-11-29 MED ORDER — MIDAZOLAM HCL 5 MG/5ML IJ SOLN
INTRAMUSCULAR | Status: DC | PRN
  Administered 2017-11-29: 2 mg via INTRAVENOUS

## 2017-11-29 MED ORDER — ONDANSETRON HCL 4 MG/2ML IJ SOLN
INTRAMUSCULAR | Status: DC | PRN
  Administered 2017-11-29: 4 mg via INTRAVENOUS

## 2017-11-29 MED ORDER — FENTANYL CITRATE (PF) 100 MCG/2ML IJ SOLN
INTRAMUSCULAR | Status: AC
Start: 2017-11-29 — End: ?
  Filled 2017-11-29: qty 2

## 2017-11-29 MED ORDER — MIDAZOLAM HCL 2 MG/2ML IJ SOLN
INTRAMUSCULAR | Status: AC
  Filled 2017-11-29: qty 2

## 2017-11-29 MED ORDER — CELECOXIB 200 MG PO CAPS
ORAL_CAPSULE | ORAL | Status: AC
  Filled 2017-11-29: qty 1

## 2017-11-29 MED ORDER — CHLORHEXIDINE GLUCONATE CLOTH 2 % EX PADS: 6.0000 | MEDICATED_PAD | Freq: Once | CUTANEOUS | Status: DC

## 2017-11-29 MED ORDER — FENTANYL CITRATE (PF) 100 MCG/2ML IJ SOLN
INTRAMUSCULAR | Status: AC
  Filled 2017-11-29: qty 2

## 2017-11-29 MED ORDER — GENTAMICIN SULFATE 40 MG/ML IJ SOLN
INTRAMUSCULAR | Status: DC | PRN
  Administered 2017-11-29: 1000 mL

## 2017-11-29 MED ORDER — GABAPENTIN 300 MG PO CAPS
300.0000 mg | ORAL_CAPSULE | ORAL | Status: AC
  Administered 2017-11-29: 300 mg via ORAL

## 2017-11-29 MED ORDER — MIDAZOLAM HCL 2 MG/2ML IJ SOLN: 1.0000 mg | INTRAMUSCULAR | Status: DC | PRN

## 2017-11-29 MED ORDER — ACETAMINOPHEN 325 MG PO TABS: 325.0000 mg | ORAL_TABLET | ORAL | Status: DC | PRN

## 2017-11-29 MED ORDER — ROCURONIUM BROMIDE 100 MG/10ML IV SOLN
INTRAVENOUS | Status: DC | PRN
  Administered 2017-11-29: 50 mg via INTRAVENOUS
  Administered 2017-11-29: 20 mg via INTRAVENOUS

## 2017-11-29 MED ORDER — ONDANSETRON HCL 4 MG/2ML IJ SOLN
INTRAMUSCULAR | Status: AC
  Filled 2017-11-29: qty 2

## 2017-11-29 MED ORDER — BUPIVACAINE-EPINEPHRINE 0.25% -1:200000 IJ SOLN
INTRAMUSCULAR | Status: DC | PRN
  Administered 2017-11-29: 20 mL

## 2017-11-29 MED ORDER — ONDANSETRON HCL 4 MG/2ML IJ SOLN: 4.0000 mg | Freq: Once | INTRAMUSCULAR | Status: DC | PRN

## 2017-11-29 MED ORDER — PROPOFOL 10 MG/ML IV BOLUS
INTRAVENOUS | Status: DC | PRN
  Administered 2017-11-29: 140 mg via INTRAVENOUS

## 2017-11-29 MED ORDER — CEFAZOLIN SODIUM-DEXTROSE 2-4 GM/100ML-% IV SOLN
2.0000 g | INTRAVENOUS | Status: AC
  Administered 2017-11-29: 2 g via INTRAVENOUS

## 2017-11-29 MED ORDER — ROCURONIUM BROMIDE 10 MG/ML (PF) SYRINGE
PREFILLED_SYRINGE | INTRAVENOUS | Status: AC
  Filled 2017-11-29: qty 5

## 2017-11-29 MED ORDER — GABAPENTIN 300 MG PO CAPS
ORAL_CAPSULE | ORAL | Status: AC
  Filled 2017-11-29: qty 1

## 2017-11-29 MED ORDER — CELECOXIB 200 MG PO CAPS
200.0000 mg | ORAL_CAPSULE | ORAL | Status: AC
  Administered 2017-11-29: 200 mg via ORAL

## 2017-11-29 MED ORDER — MEPERIDINE HCL 25 MG/ML IJ SOLN: 6.2500 mg | INTRAMUSCULAR | Status: DC | PRN

## 2017-11-29 MED ORDER — DEXAMETHASONE SODIUM PHOSPHATE 10 MG/ML IJ SOLN
INTRAMUSCULAR | Status: AC
  Filled 2017-11-29: qty 1

## 2017-11-29 MED ORDER — SCOPOLAMINE 1 MG/3DAYS TD PT72: 1.0000 | MEDICATED_PATCH | Freq: Once | TRANSDERMAL | Status: DC | PRN

## 2017-11-29 MED ORDER — FENTANYL CITRATE (PF) 100 MCG/2ML IJ SOLN
INTRAMUSCULAR | Status: DC | PRN
  Administered 2017-11-29: 25 ug via INTRAVENOUS
  Administered 2017-11-29: 100 ug via INTRAVENOUS
  Administered 2017-11-29: 25 ug via INTRAVENOUS
  Administered 2017-11-29: 50 ug via INTRAVENOUS
  Administered 2017-11-29: 25 ug via INTRAVENOUS

## 2017-11-29 MED ORDER — KETOROLAC TROMETHAMINE 30 MG/ML IJ SOLN: 30.0000 mg | Freq: Once | INTRAMUSCULAR | Status: DC | PRN

## 2017-11-29 MED ORDER — FENTANYL CITRATE (PF) 100 MCG/2ML IJ SOLN: 25.0000 ug | INTRAMUSCULAR | Status: DC | PRN

## 2017-11-29 MED ORDER — HYDROMORPHONE HCL 1 MG/ML IJ SOLN: 0.2500 mg | INTRAMUSCULAR | Status: DC | PRN

## 2017-11-29 MED ORDER — EPHEDRINE SULFATE 50 MG/ML IJ SOLN
INTRAMUSCULAR | Status: DC | PRN
  Administered 2017-11-29: 25 mg via INTRAVENOUS

## 2017-11-29 MED ORDER — ACETAMINOPHEN 500 MG PO TABS
ORAL_TABLET | ORAL | Status: AC
Start: 2017-11-29 — End: ?
  Filled 2017-11-29: qty 2

## 2017-11-29 MED ORDER — LIDOCAINE 2% (20 MG/ML) 5 ML SYRINGE
INTRAMUSCULAR | Status: AC
  Filled 2017-11-29: qty 5

## 2017-11-29 MED ORDER — LACTATED RINGERS IV SOLN
INTRAVENOUS | Status: DC
  Administered 2017-11-29 (×2): via INTRAVENOUS

## 2017-11-29 MED ORDER — ACETAMINOPHEN 500 MG PO TABS
1000.0000 mg | ORAL_TABLET | ORAL | Status: AC
  Administered 2017-11-29: 1000 mg via ORAL

## 2017-11-29 MED ORDER — SUGAMMADEX SODIUM 200 MG/2ML IV SOLN
INTRAVENOUS | Status: DC | PRN
  Administered 2017-11-29: 174.2 mg via INTRAVENOUS

## 2017-11-29 MED ORDER — ACETAMINOPHEN 160 MG/5ML PO SOLN: 325.0000 mg | ORAL | Status: DC | PRN

## 2017-11-29 MED ORDER — CEFAZOLIN SODIUM-DEXTROSE 2-4 GM/100ML-% IV SOLN
INTRAVENOUS | Status: AC
Start: 2017-11-29 — End: ?
  Filled 2017-11-29: qty 100

## 2017-11-29 MED ORDER — OXYCODONE HCL 5 MG/5ML PO SOLN: 5.0000 mg | Freq: Once | ORAL | Status: DC | PRN

## 2017-11-29 MED ORDER — OXYCODONE HCL 5 MG PO TABS: 5.0000 mg | ORAL_TABLET | Freq: Once | ORAL | Status: DC | PRN

## 2017-11-29 MED ORDER — HYDROCODONE-ACETAMINOPHEN 5-325 MG PO TABS: 1.0000 | ORAL_TABLET | ORAL | 0 refills | Status: DC | PRN

## 2017-11-29 MED ORDER — SUGAMMADEX SODIUM 200 MG/2ML IV SOLN
INTRAVENOUS | Status: AC
Start: 2017-11-29 — End: ?
  Filled 2017-11-29: qty 2

## 2017-11-29 MED ORDER — PROPOFOL 10 MG/ML IV BOLUS
INTRAVENOUS | Status: AC
  Filled 2017-11-29: qty 20

## 2017-11-29 SURGICAL SUPPLY — 60 items
ADH SKN CLS APL DERMABOND .7 (GAUZE/BANDAGES/DRESSINGS) ×1
ALLODERM 8X16 MED THICK (Tissue) ×2 IMPLANT
BAG DECANTER FOR FLEXI CONT (MISCELLANEOUS) ×2 IMPLANT
BINDER BREAST XLRG (GAUZE/BANDAGES/DRESSINGS) ×2 IMPLANT
BLADE SURG 10 STRL SS (BLADE) ×2 IMPLANT
BLADE SURG 15 STRL LF DISP TIS (BLADE) IMPLANT
BLADE SURG 15 STRL SS (BLADE) ×2
BNDG GAUZE ELAST 4 BULKY (GAUZE/BANDAGES/DRESSINGS) ×4 IMPLANT
CANISTER SUCT 1200ML W/VALVE (MISCELLANEOUS) ×2 IMPLANT
CHLORAPREP W/TINT 26ML (MISCELLANEOUS) ×2 IMPLANT
COVER BACK TABLE 60X90IN (DRAPES) ×2 IMPLANT
COVER MAYO STAND STRL (DRAPES) ×2 IMPLANT
DERMABOND ADVANCED (GAUZE/BANDAGES/DRESSINGS) ×1
DERMABOND ADVANCED .7 DNX12 (GAUZE/BANDAGES/DRESSINGS) ×1 IMPLANT
DRAIN CHANNEL 19F RND (DRAIN) ×2 IMPLANT
DRAPE TOP ARMCOVERS (MISCELLANEOUS) ×2 IMPLANT
DRAPE U-SHAPE 76X120 STRL (DRAPES) ×2 IMPLANT
DRSG PAD ABDOMINAL 8X10 ST (GAUZE/BANDAGES/DRESSINGS) ×3 IMPLANT
DRSG TEGADERM 4X10 (GAUZE/BANDAGES/DRESSINGS) IMPLANT
DRSG TEGADERM 4X4.75 (GAUZE/BANDAGES/DRESSINGS) IMPLANT
ELECT BLADE 4.0 EZ CLEAN MEGAD (MISCELLANEOUS) ×2
ELECT COATED BLADE 2.86 ST (ELECTRODE) ×2 IMPLANT
ELECT REM PT RETURN 9FT ADLT (ELECTROSURGICAL) ×2
ELECTRODE BLDE 4.0 EZ CLN MEGD (MISCELLANEOUS) ×1 IMPLANT
ELECTRODE REM PT RTRN 9FT ADLT (ELECTROSURGICAL) ×1 IMPLANT
EVACUATOR SILICONE 100CC (DRAIN) ×2 IMPLANT
EXPANDER TISSUE MX 500CC (Prosthesis & Implant Plastic) IMPLANT
GLOVE BIO SURGEON STRL SZ 6 (GLOVE) ×4 IMPLANT
GLOVE BIO SURGEON STRL SZ 6.5 (GLOVE) ×1 IMPLANT
GLOVE BIO SURGEON STRL SZ7 (GLOVE) ×1 IMPLANT
GLOVE BIOGEL PI IND STRL 6.5 (GLOVE) IMPLANT
GLOVE BIOGEL PI INDICATOR 6.5 (GLOVE) ×1
GOWN STRL REUS W/ TWL LRG LVL3 (GOWN DISPOSABLE) ×2 IMPLANT
GOWN STRL REUS W/TWL LRG LVL3 (GOWN DISPOSABLE) ×4
IV NS 500ML (IV SOLUTION) ×2
IV NS 500ML BAXH (IV SOLUTION) ×1 IMPLANT
KIT FILL SYSTEM UNIVERSAL (SET/KITS/TRAYS/PACK) ×2 IMPLANT
NDL HYPO 25X1 1.5 SAFETY (NEEDLE) IMPLANT
NEEDLE HYPO 25X1 1.5 SAFETY (NEEDLE) ×2 IMPLANT
PACK BASIN DAY SURGERY FS (CUSTOM PROCEDURE TRAY) ×2 IMPLANT
PENCIL BUTTON HOLSTER BLD 10FT (ELECTRODE) ×2 IMPLANT
PIN SAFETY STERILE (MISCELLANEOUS) ×2 IMPLANT
PUNCH BIOPSY DERMAL 4MM (MISCELLANEOUS) ×1 IMPLANT
SHEET MEDIUM DRAPE 40X70 STRL (DRAPES) ×2 IMPLANT
SLEEVE SCD COMPRESS KNEE MED (MISCELLANEOUS) ×2 IMPLANT
SPONGE LAP 18X18 X RAY DECT (DISPOSABLE) ×4 IMPLANT
STAPLER VISISTAT 35W (STAPLE) ×1 IMPLANT
SUT ETHILON 2 0 FS 18 (SUTURE) ×1 IMPLANT
SUT MNCRL AB 4-0 PS2 18 (SUTURE) ×3 IMPLANT
SUT VIC AB 3-0 SH 27 (SUTURE) ×10
SUT VIC AB 3-0 SH 27X BRD (SUTURE) ×1 IMPLANT
SUT VICRYL 4-0 PS2 18IN ABS (SUTURE) ×2 IMPLANT
SYR BULB IRRIGATION 50ML (SYRINGE) ×2 IMPLANT
SYR CONTROL 10ML LL (SYRINGE) ×1 IMPLANT
TISSUE ALLDRM 8X16 MED THICK (Tissue) IMPLANT
TISSUE EXPANDER MX 500CC (Prosthesis & Implant Plastic) ×2 IMPLANT
TOWEL OR 17X24 6PK STRL BLUE (TOWEL DISPOSABLE) ×4 IMPLANT
TUBE CONNECTING 20X1/4 (TUBING) ×3 IMPLANT
UNDERPAD 30X30 (UNDERPADS AND DIAPERS) ×3 IMPLANT
YANKAUER SUCT BULB TIP NO VENT (SUCTIONS) ×2 IMPLANT

## 2017-11-29 NOTE — Anesthesia Procedure Notes (Signed)
Procedure Name: Intubation Date/Time: 11/29/2017 9:41 AM Performed by: Marrianne Mood, CRNA Pre-anesthesia Checklist: Patient identified, Emergency Drugs available, Suction available, Patient being monitored and Timeout performed Patient Re-evaluated:Patient Re-evaluated prior to induction Oxygen Delivery Method: Circle system utilized Preoxygenation: Pre-oxygenation with 100% oxygen Induction Type: IV induction Ventilation: Mask ventilation without difficulty Laryngoscope Size: Miller and 3 Grade View: Grade III Tube type: Oral Tube size: 7.0 mm Number of attempts: 1 Airway Equipment and Method: Stylet and Oral airway Placement Confirmation: ETT inserted through vocal cords under direct vision,  positive ETCO2 and breath sounds checked- equal and bilateral Secured at: 21 cm Tube secured with: Tape Dental Injury: Teeth and Oropharynx as per pre-operative assessment

## 2017-11-29 NOTE — Discharge Instructions (Signed)
Last Dose of 1000mg  Tylenol at 9:19am  Post Anesthesia Home Care Instructions  Activity: Get plenty of rest for the remainder of the day. A responsible individual must stay with you for 24 hours following the procedure.  For the next 24 hours, DO NOT: -Drive a car -Paediatric nurse -Drink alcoholic beverages -Take any medication unless instructed by your physician -Make any legal decisions or sign important papers.  Meals: Start with liquid foods such as gelatin or soup. Progress to regular foods as tolerated. Avoid greasy, spicy, heavy foods. If nausea and/or vomiting occur, drink only clear liquids until the nausea and/or vomiting subsides. Call your physician if vomiting continues.  Special Instructions/Symptoms: Your throat may feel dry or sore from the anesthesia or the breathing tube placed in your throat during surgery. If this causes discomfort, gargle with warm salt water. The discomfort should disappear within 24 hours.  If you had a scopolamine patch placed behind your ear for the management of post- operative nausea and/or vomiting:  1. The medication in the patch is effective for 72 hours, after which it should be removed.  Wrap patch in a tissue and discard in the trash. Wash hands thoroughly with soap and water. 2. You may remove the patch earlier than 72 hours if you experience unpleasant side effects which may include dry mouth, dizziness or visual disturbances. 3. Avoid touching the patch. Wash your hands with soap and water after contact with the patch.   About my Jackson-Pratt Bulb Drain  What is a Jackson-Pratt bulb? A Jackson-Pratt is a soft, round device used to collect drainage. It is connected to a long, thin drainage catheter, which is held in place by one or two small stiches near your surgical incision site. When the bulb is squeezed, it forms a vacuum, forcing the drainage to empty into the bulb.  Emptying the Jackson-Pratt bulb- To empty the bulb: 1.  Release the plug on the top of the bulb. 2. Pour the bulb's contents into a measuring container which your nurse will provide. 3. Record the time emptied and amount of drainage. Empty the drain(s) as often as your     doctor or nurse recommends.  Date                  Time                    Amount (Drain 1)                 Amount (Drain 2)  _____________________________________________________________________  _____________________________________________________________________  _____________________________________________________________________  _____________________________________________________________________  _____________________________________________________________________  _____________________________________________________________________  _____________________________________________________________________  _____________________________________________________________________  Squeezing the Jackson-Pratt Bulb- To squeeze the bulb: 1. Make sure the plug at the top of the bulb is open. 2. Squeeze the bulb tightly in your fist. You will hear air squeezing from the bulb. 3. Replace the plug while the bulb is squeezed. 4. Use a safety pin to attach the bulb to your clothing. This will keep the catheter from     pulling at the bulb insertion site.  When to call your doctor- Call your doctor if:  Drain site becomes red, swollen or hot.  You have a fever greater than 101 degrees F.  There is oozing at the drain site.  Drain falls out (apply a guaze bandage over the drain hole and secure it with tape).  Drainage increases daily not related to activity patterns. (You will usually have more drainage when you are active than when  you are resting.)  Drainage has a bad odor.

## 2017-11-29 NOTE — Anesthesia Preprocedure Evaluation (Signed)
Anesthesia Evaluation  Patient identified by MRN, date of birth, ID band Patient awake    Reviewed: Allergy & Precautions, NPO status , Patient's Chart, lab work & pertinent test results  Airway Mallampati: II  TM Distance: >3 FB Neck ROM: Full    Dental no notable dental hx.    Pulmonary neg pulmonary ROS, former smoker,    Pulmonary exam normal breath sounds clear to auscultation       Cardiovascular hypertension, Pt. on medications Normal cardiovascular exam Rhythm:Regular Rate:Normal     Neuro/Psych PSYCHIATRIC DISORDERS Anxiety Depression negative neurological ROS  negative psych ROS   GI/Hepatic negative GI ROS, Neg liver ROS,   Endo/Other  negative endocrine ROS  Renal/GU negative Renal ROS  negative genitourinary   Musculoskeletal negative musculoskeletal ROS (+)   Abdominal   Peds negative pediatric ROS (+)  Hematology negative hematology ROS (+)   Anesthesia Other Findings   Reproductive/Obstetrics negative OB ROS                             Anesthesia Physical  Anesthesia Plan  ASA: II  Anesthesia Plan: General   Post-op Pain Management:    Induction: Intravenous  PONV Risk Score and Plan: 3 and Ondansetron, Dexamethasone, Midazolam and Treatment may vary due to age or medical condition  Airway Management Planned: LMA and Oral ETT  Additional Equipment:   Intra-op Plan:   Post-operative Plan: Extubation in OR  Informed Consent: I have reviewed the patients History and Physical, chart, labs and discussed the procedure including the risks, benefits and alternatives for the proposed anesthesia with the patient or authorized representative who has indicated his/her understanding and acceptance.   Dental advisory given  Plan Discussed with: CRNA  Anesthesia Plan Comments:         Anesthesia Quick Evaluation

## 2017-11-29 NOTE — Transfer of Care (Signed)
Immediate Anesthesia Transfer of Care Note  Patient: Misty Lynch  Procedure(s) Performed: RIGHT BREAST RECONSTRUCTION WITH PLACEMENT OF TISSUE EXPANDER AND ALLODERM (Right Breast)  Patient Location: PACU  Anesthesia Type:General  Level of Consciousness: sedated  Airway & Oxygen Therapy: Patient Spontanous Breathing and Patient connected to face mask oxygen  Post-op Assessment: Report given to RN and Post -op Vital signs reviewed and stable  Post vital signs: Reviewed and stable  Last Vitals:  Vitals:   11/29/17 0852 11/29/17 1145  BP: (!) 160/74 (!) 142/64  Pulse: 66 83  Resp: 18 12  Temp: 37 C   SpO2: 100% 100%    Last Pain:  Vitals:   11/29/17 0852  TempSrc: Oral         Complications: No apparent anesthesia complications

## 2017-11-29 NOTE — Anesthesia Postprocedure Evaluation (Signed)
Anesthesia Post Note  Patient: Misty Lynch  Procedure(s) Performed: RIGHT BREAST RECONSTRUCTION WITH PLACEMENT OF TISSUE EXPANDER AND ALLODERM (Right Breast)     Patient location during evaluation: PACU Anesthesia Type: General Level of consciousness: awake and alert Pain management: pain level controlled Vital Signs Assessment: post-procedure vital signs reviewed and stable Respiratory status: spontaneous breathing, nonlabored ventilation and respiratory function stable Cardiovascular status: blood pressure returned to baseline and stable Postop Assessment: no apparent nausea or vomiting Anesthetic complications: no    Last Vitals:  Vitals:   11/29/17 1230 11/29/17 1310  BP: 135/72 138/67  Pulse:  71  Resp:  20  Temp:  36.6 C  SpO2:  100%    Last Pain:  Vitals:   11/29/17 1310  TempSrc:   PainSc: 0-No pain                 Lynda Rainwater

## 2017-11-29 NOTE — Interval H&P Note (Signed)
History and Physical Interval Note:  11/29/2017 9:04 AM  Misty Lynch  has presented today for surgery, with the diagnosis of HISTORY OF BREAST CANCER, ACQUIRED ABSENCE OF BREAST  The various methods of treatment have been discussed with the patient and family. After consideration of risks, benefits and other options for treatment, the patient has consented to  Procedure(s): RIGHT BREAST RECONSTRUCTION WITH PLACEMENT OF TISSUE EXPANDER AND ALLODERM (Right) as a surgical intervention .  The patient's history has been reviewed, patient examined, no change in status, stable for surgery.  I have reviewed the patient's chart and labs.  Questions were answered to the patient's satisfaction.     [name]

## 2017-11-29 NOTE — Op Note (Signed)
Operative Note   DATE OF OPERATION: 2.1.19  LOCATION: [facility]-outpatient  SURGICAL DIVISION: Plastic Surgery  PREOPERATIVE DIAGNOSES:  1. History breast cancer 2. Acquired absence bilateral breasts  POSTOPERATIVE DIAGNOSES:  same  PROCEDURE:  1. Right breast reconstruction with tissue expander 2. Acellular dermis (Alloderm) for breast reconstruction 100cm2  SURGEON: [name] MD MBA  ASSISTANT: S. Rayburn PA-C  ANESTHESIA:  General.   EBL: 60 ml  COMPLICATIONS: None immediate.   INDICATIONS FOR PROCEDURE:  The patient, Misty Lynch, is a 62 y.o. female born on Nov 19, 1955, is here for right breast reconstruction. She underwent bilateral immediate expander, acellular dermis reconstruction following nipple sparing mastectomies. Course complicated by right nipple necrosis and right chest infection requiring removal expander. She is now 2.5 months post operative from this and plan for replacement.   FINDINGS: Natrelle 133MX-13-T 500 ml tissue expander placed, initial fill volume 20 ml [id number]  DESCRIPTION OF PROCEDURE:  The patient's operative site was marked with the patient in the preoperative area. The patient was taken to the operating room. SCDs were placed and IV antibiotics were given. The patient's operative site was prepped and draped in a sterile fashion. A time out was performed and all information was confirmed to be correct. Incision made in prior inframammary fold scar. This was carried to chest wall. Skin flap tethered to chest wall inferior to NAC and perforation skin flap incurred. Lateral border pectoralis identified and submuscular dissection completed to accommodate expander. Limited dissection skin flap from pectoralis completed to aid with suturing ADM. Local anesthetic infiltrated. Cavity irrigated with solution containing Ancef, gentamicin, and bacitracin. Hemostasis ensured. Acellular dermis perforated and prepared. This was sutured to chest with  3-0 vicryl. 19Fr JP placed in cavity and secured with 2-0 nylon. The cavity was then irrigated with Betadine. Expander prepared and placed in submuscular cavity. Acellular dermis was then wrapped over lower border expander and secured to lateral pectoralis muscle with 3-0 vicryl. Skin closure completedwith 3-0 vicryl in fascial layer and 4-0 vicryl in dermis. Skin closure completed with 4-0 monocryl subcuticular. The perforated skin was closed primarily with interrupted 4-0 monocryl in dermis and 4-0 monocryl for skin closure.Tissue adhesive applied. The port was accessed and filled with 20 ml saline. Tegaderm dressing applied over incisions. Dry dressing and breast binder applied.   The patient was allowed to wake from anesthesia, extubated and taken to the recovery room in satisfactory condition.   SPECIMENS: none  DRAINS: [place], MD [facility] Plastic & Reconstructive Surgery [phone], pin [phone]

## 2017-12-24 ENCOUNTER — Telehealth: Payer: Self-pay

## 2017-12-24 NOTE — Telephone Encounter (Signed)
Spoke with pt to remind of [facility] appt on 12/30/17 at 10 am with LC.  Pt said she would be at appt.

## 2017-12-30 ENCOUNTER — Inpatient Hospital Stay: Payer: 59 | Attending: Adult Health | Admitting: Adult Health

## 2017-12-30 ENCOUNTER — Telehealth: Payer: Self-pay | Admitting: Adult Health

## 2017-12-30 ENCOUNTER — Encounter: Payer: Self-pay | Admitting: Adult Health

## 2017-12-30 ENCOUNTER — Ambulatory Visit (HOSPITAL_COMMUNITY)
Admission: RE | Admit: 2017-12-30 | Discharge: 2017-12-30 | Disposition: A | Discharge status: Home / Self Care | Payer: 59 | Source: Ambulatory Visit | Attending: Adult Health | Admitting: Adult Health

## 2017-12-30 VITALS — BP 156/58 | HR 66 | Temp 98.9°F | Resp 18 | Ht 64.5 in | Wt 201.0 lb

## 2017-12-30 DIAGNOSIS — M79602 Pain in left arm: Secondary | ICD-10-CM | POA: Insufficient documentation

## 2017-12-30 DIAGNOSIS — C50412 Malignant neoplasm of upper-outer quadrant of left female breast: Secondary | ICD-10-CM | POA: Insufficient documentation

## 2017-12-30 DIAGNOSIS — M25512 Pain in left shoulder: Secondary | ICD-10-CM | POA: Insufficient documentation

## 2017-12-30 DIAGNOSIS — Z170 Estrogen receptor positive status [ER+]: Secondary | ICD-10-CM | POA: Insufficient documentation

## 2017-12-30 DIAGNOSIS — M8580 Other specified disorders of bone density and structure, unspecified site: Secondary | ICD-10-CM | POA: Diagnosis not present

## 2017-12-30 NOTE — Progress Notes (Signed)
CLINIC:  Survivorship   REASON FOR VISIT:  Routine follow-up post-treatment for a recent history of breast cancer.  BRIEF ONCOLOGIC HISTORY:    Malignant neoplasm of upper-outer quadrant of left breast in female, estrogen receptor positive ([name])   06/24/2017 Initial Diagnosis    Left breast biopsy upper outer quadrant: Invasive lobular cancer with LCIS; upper inner quadrant: Invasive lobular cancer with ADH, grade 1-2, ER 40%, PR 90%, Ki-67 2%, HER-2 negative ratio 1.19; tumor size indeterminate but based on the 2 mm calcifications it is currently being staged as T1 1 N0 stage IA, however we suspect that it is inaccurate clinical staging      08/23/2017 Surgery    Left mastectomy: LCIS, invasive cancer not identified in the specimen, 0/4 lymph nodes negative, based on the biopsy, 2 foci of invasive lobular cancer larger span 0.7 cm grade 2 ER 40%, PR 90%, HER-2 negative ratio 1.19, Ki-67 2%, T1BN0 stage I A;  right mastectomy: Atypical lobular hyperplasia      08/2017 -  Anti-estrogen oral therapy    Anastrozole daily       INTERVAL HISTORY:  Misty Lynch presents to the [facility] today for our initial meeting to review her survivorship care plan detailing her treatment course for breast cancer, as well as monitoring long-term side effects of that treatment, education regarding health maintenance, screening, and overall wellness and health promotion.     Overall, Misty Lynch reports doing moderately well.  She is taking Anastrozole daily.  She notes that she has some thinning in her hair.  Otherwise she is doing well.  She has a PCP that she sees regularly.  She has not yet started exercise.  She has pain in her left arm x 3-5 days it is constant.  It is in her upper arm. She also has pain in her shoulder.  She has previously needed physical therapy for this.  Since her surgery she has bilateral ROM deficiency in her bilateral shoulders.     REVIEW OF SYSTEMS:  Review of  Systems  Constitutional: Negative for appetite change, chills, fatigue and fever.  HENT:   Negative for hearing loss and lump/mass.   Eyes: Negative for eye problems and icterus.  Respiratory: Negative for chest tightness, cough and shortness of breath.   Cardiovascular: Negative for chest pain, leg swelling and palpitations.  Gastrointestinal: Negative for abdominal distention, abdominal pain, constipation and diarrhea.  Endocrine: Negative for hot flashes.  Genitourinary: Negative for difficulty urinating.   Skin: Negative for itching and rash.  Neurological: Negative for dizziness, extremity weakness and headaches.  Hematological: Negative for adenopathy. Does not bruise/bleed easily.  Psychiatric/Behavioral: Negative for depression. The patient is not nervous/anxious.   Breast: Denies any new nodularity, masses, tenderness, nipple changes, or nipple discharge.      ONCOLOGY TREATMENT TEAM:  1. Surgeon:  Dr. Excell Seltzer at [facility] 2. Medical Oncologist: Dr. Lindi Adie  3. Radiation Oncologist: Dr. Lisbeth Renshaw    PAST MEDICAL/SURGICAL HISTORY:  Past Medical History:  Diagnosis Date  . Dental crowns present   . Functional heart murmur   . History of breast cancer 06/2017   left  . Hypertension    states under control with meds., has been on med. x 25 yr.  . Rash 11/22/2017   posterior right ear   Past Surgical History:  Procedure Laterality Date  . BREAST RECONSTRUCTION WITH PLACEMENT OF TISSUE EXPANDER AND FLEX HD (ACELLULAR HYDRATED DERMIS) Bilateral 08/23/2017   Procedure: BREAST RECONSTRUCTION WITH  PLACEMENT OF TISSUE EXPANDER AND Alloderm (ACELLULAR HYDRATED DERMIS);  Surgeon: [name], MD;  Location: [place];  Service: Plastics;  Laterality: Bilateral;  . COLONOSCOPY    . DILATION AND CURETTAGE OF UTERUS    . NIPPLE SPARING MASTECTOMY/SENTINAL LYMPH NODE BIOPSY/RECONSTRUCTION/PLACEMENT OF TISSUE EXPANDER Bilateral 08/23/2017    Procedure: BILATERAL NIPPLE SPARING MASTECTOMIES WITH LEFT SENTINEL LYMPH NODE BIOPSY;  Surgeon: Excell Seltzer, MD;  Location: [place];  Service: General;  Laterality: Bilateral;  . REMOVAL OF TISSUE EXPANDER AND PLACEMENT OF IMPLANT Bilateral 09/17/2017   Procedure: REMOVAL OF RIGHT TISSUE EXPANDER, LEFT CHEST TISSUE EXPANSION;  Surgeon: [name], MD;  Location: [place];  Service: Plastics;  Laterality: Bilateral;  . TUBAL LIGATION       ALLERGIES:  Allergies  Allergen Reactions  . Tetracyclines & Related Itching     CURRENT MEDICATIONS:  Outpatient Encounter Medications as of 12/30/2017  Medication Sig  . anastrozole (ARIMIDEX) 1 MG tablet Take 1 mg by mouth daily.  Marland Kitchen aspirin EC 81 MG tablet Take 81 mg by mouth daily.  . Calcium-Vitamin D-Vitamin K 500-100-40 MG-UNT-MCG CHEW Chew by mouth 2 (two) times daily with a meal.  . cyclobenzaprine (FLEXERIL) 10 MG tablet Take 10 mg by mouth 2 (two) times daily as needed for muscle spasms.  Marland Kitchen HYDROcodone-acetaminophen (NORCO/VICODIN) 5-325 MG tablet Take 1-2 tablets by mouth every 4 (four) hours as needed for moderate pain.  Marland Kitchen losartan-hydrochlorothiazide (HYZAAR) 100-25 MG tablet Take 1 tablet by mouth daily.  . vitamin C (ASCORBIC ACID) 500 MG tablet Take 500 mg by mouth daily.   No facility-administered encounter medications on file as of 12/30/2017.      ONCOLOGIC FAMILY HISTORY:  Family History  Problem Relation Age of Onset  . Bladder Cancer Father      GENETIC COUNSELING/TESTING: Not at this time  SOCIAL HISTORY:  Misty Lynch is married and lives with her husband in [place], [place].  She has 2 sons who live in [place] as well.  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  She denies any current or history of tobacco, alcohol, or illicit drug use.     PHYSICAL EXAMINATION:  Vital Signs:   Vitals:   12/30/17 0940  BP: (!)  156/58  Pulse: 66  Resp: 18  Temp: 98.9 F (37.2 C)  SpO2: 100%   Filed Weights   12/30/17 0940  Weight: 201 lb (91.2 kg)   General: Well-nourished, well-appearing female in no acute distress.  She is unaccompanied today.   HEENT: Head is normocephalic.  Pupils equal and reactive to light. Conjunctivae clear without exudate.  Sclerae anicteric. Oral mucosa is pink, moist.  Oropharynx is pink without lesions or erythema.  Lymph: No cervical, supraclavicular, or infraclavicular lymphadenopathy noted on palpation.  Cardiovascular: Regular rate and rhythm.Marland Kitchen Respiratory: Clear to auscultation bilaterally. Chest expansion symmetric; breathing non-labored.  Breasts: s/p bilateral mastectomies and reconstruction, no nodules, masses, swelling, or sign of infection GI: Abdomen soft and round; non-tender, non-distended. Bowel sounds normoactive.  GU: Deferred.  Neuro: No focal deficits. Steady gait.  Psych: Mood and affect normal and appropriate for situation.  Extremities: Slight swelling in left upper arm.  MSK: No focal spinal tenderness to palpation.  Slight limitation in range of motion in bilateral upper extremities (in her shoulders) Skin: Warm and dry.  LABORATORY DATA:  None for this visit.  DIAGNOSTIC IMAGING:  None for this visit.  ASSESSMENT AND PLAN:  Ms.. Lynch is a pleasant 62 y.o. female with Stage IA left breast invasive lobular carcinoma, ER+/PR+/HER2-, diagnosed in 05/2017, treated with mastecotmy, and anti-estrogen therapy with Anastrozole beginning in 08/2017.  She presents to the Survivorship Clinic for our initial meeting and routine follow-up post-completion of treatment for breast cancer.    1. Stage IA left breast cancer:  Misty Lynch is continuing to recover from definitive treatment for breast cancer. She will follow-up with her medical oncologist, Dr. Lindi Adie in 6 months with history and physical exam per surveillance protocol.  She will continue her  anti-estrogen therapy with Anastrozole. Thus far, she is tolerating the Anastrozole well, with minimal side effects. She was instructed to make Dr. Lindi Adie or myself aware if she begins to experience any worsening side effects of the medication and I could see her back in clinic to help manage those side effects, as needed. Today, a comprehensive survivorship care plan and treatment summary was reviewed with the patient today detailing her breast cancer diagnosis, treatment course, potential late/long-term effects of treatment, appropriate follow-up care with recommendations for the future, and patient education resources.  A copy of this summary, along with a letter will be sent to the patient's primary care provider via mail/fax/In Basket message after today's visit.    2. Left arm pain: We will do an xray of her left humerus and shoulder.  She will also be evaluated by physical therapy for ? Early lymphedema and her ROM limitation.  She will hold off on seeing her PCP at this time.  She has Naproxen 565m at home that she is going to take if needed.  3. ROM limitation: I recommended she do her shoulder exercises for this.  I also recommended that she see Physical therapy again.    4. Bone health:  Given Misty Lynch's age/history of breast cancer and her current treatment regimen including anti-estrogen therapy with Anastrozole, she is at risk for bone demineralization.  Her last DEXA scan was 12/07/2016, which showed osteopenia with a T score of -1.2  In the meantime, she was encouraged to increase her consumption of foods rich in calcium, as well as increase her weight-bearing activities.  She was given education on specific activities to promote bone health.  5. Cancer screening:  Due to Misty Lynch's history and her age, she should receive screening for skin cancers, colon cancer, and gynecologic cancers.  The information and recommendations are listed on the patient's comprehensive care plan/treatment  summary and were reviewed in detail with the patient.    6. Health maintenance and wellness promotion: Ms. SGoeringwas encouraged to consume 5-7 servings of fruits and vegetables per day. We reviewed the "Nutrition Rainbow" handout, as well as the handout "Take Control of Your Health and Reduce Your Cancer Risk" from the [employer]  She was also encouraged to engage in moderate to vigorous exercise for 30 minutes per day most days of the week. We discussed the [employer] fitness program, which is designed for cancer survivors to help them become more physically fit after cancer treatments.  She was instructed to limit her alcohol consumption and continue to abstain from tobacco use.     7. Support services/counseling: It is not uncommon for this period of the patient's cancer care trajectory to be one of many emotions and stressors.  We discussed an opportunity for her to participate in the next session of [facility]Finding Your New Normal") support group series designed for patients  after they have completed treatment.   Misty Lynch was encouraged to take advantage of our many other support services programs, support groups, and/or counseling in coping with her new life as a cancer survivor after completing anti-cancer treatment.  She was offered support today through active listening and expressive supportive counseling.  She was given information regarding our available services and encouraged to contact me with any questions or for help enrolling in any of our support group/programs.    Dispo:   -Return to cancer center for f/u with Dr. Lindi Adie in 06/2018 -Bone Density due 12/07/2018 -Continue f/u with Dr. Iran Planas -She is welcome to return back to the Survivorship Clinic at any time; no additional follow-up needed at this time.  -Consider referral back to survivorship as a long-term survivor for continued surveillance  A total of (30) minutes of face-to-face time was spent with this  patient with greater than 50% of that time in counseling and care-coordination.   Misty Phlegm, NP Survivorship Program [place] [phone]   Note: PRIMARY CARE PROVIDER Kathyrn Lass, [place] [phone]

## 2017-12-30 NOTE — Telephone Encounter (Signed)
Gave avs and calendar for September  °

## 2017-12-31 ENCOUNTER — Encounter: Payer: Self-pay | Admitting: *Deleted

## 2017-12-31 NOTE — Progress Notes (Signed)
[place] Psychosocial Distress Screening Clinical Social Work  Clinical Social Work was referred by distress screening protocol.  The patient scored a 7 on the Psychosocial Distress Thermometer which indicates moderate distress. Clinical Social Worker contacted patient at home to assess for distress and other psychosocial needs.  CSW left patient a message offering support and information on the support team role in survivorship and support services.  CSW encouraged patient to call back with questions or concerns.       ONCBCN DISTRESS SCREENING 12/30/2017  Screening Type Initial Screening  Distress experienced in past week (1-10) 7  Practical problem type (No Data)  Family Problem type Children  Emotional problem type (No Data)  Spiritual/Religous concerns type (No Data)  Information Concerns Type (No Data)  Physical Problem type (No Data)    Johnnye Lana, MSW, LCSW, OSW-C Clinical Social Worker [place] [phone]

## 2018-01-03 ENCOUNTER — Ambulatory Visit: Payer: 59 | Admitting: Physical Therapy

## 2018-01-13 ENCOUNTER — Ambulatory Visit: Payer: 59 | Attending: Plastic Surgery | Admitting: Physical Therapy

## 2018-01-13 ENCOUNTER — Other Ambulatory Visit: Payer: Self-pay

## 2018-01-13 DIAGNOSIS — Z483 Aftercare following surgery for neoplasm: Secondary | ICD-10-CM | POA: Diagnosis present

## 2018-01-13 DIAGNOSIS — M25611 Stiffness of right shoulder, not elsewhere classified: Secondary | ICD-10-CM

## 2018-01-13 DIAGNOSIS — M25612 Stiffness of left shoulder, not elsewhere classified: Secondary | ICD-10-CM | POA: Diagnosis present

## 2018-01-13 NOTE — Therapy (Addendum)
Misty Lynch, [place], [address] Phone: [phone]   Fax:  [phone]  Physical Therapy Evaluation  Patient Details  Name: Misty Lynch MRN: [id number] Date of Birth: Jun 12, 1956 Referring Provider: Wilber Lynch (Dr. Ashley Lynch is plastic surgeon)   Encounter Date: 01/13/2018  PT End of Session - 01/13/18 1647    Visit Number  1    Number of Visits  2    Date for PT Re-Evaluation  02/25/18    PT Start Time  1604    PT Stop Time  1640    PT Time Calculation (min)  36 min    Activity Tolerance  Patient tolerated treatment well    Behavior During Therapy  [facility] for tasks assessed/performed       Past Medical History:  Diagnosis Date  . Dental crowns present   . Functional heart murmur   . History of breast cancer 06/2017   left  . Hypertension    states under control with meds., has been on med. x 25 yr.  . Rash 11/22/2017   posterior right ear    Past Surgical History:  Procedure Laterality Date  . BREAST RECONSTRUCTION WITH PLACEMENT OF TISSUE EXPANDER AND FLEX HD (ACELLULAR HYDRATED DERMIS) Bilateral 08/23/2017   Procedure: BREAST RECONSTRUCTION WITH PLACEMENT OF TISSUE EXPANDER AND Alloderm (ACELLULAR HYDRATED DERMIS);  Surgeon: Misty Limbo, MD;  Location: [place];  Service: Plastics;  Laterality: Bilateral;  . COLONOSCOPY    . DILATION AND CURETTAGE OF UTERUS    . NIPPLE SPARING MASTECTOMY/SENTINAL LYMPH NODE BIOPSY/RECONSTRUCTION/PLACEMENT OF TISSUE EXPANDER Bilateral 08/23/2017   Procedure: BILATERAL NIPPLE SPARING MASTECTOMIES WITH LEFT SENTINEL LYMPH NODE BIOPSY;  Surgeon: Misty Seltzer, MD;  Location: [facility];  Service: General;  Laterality: Bilateral;  . REMOVAL OF TISSUE EXPANDER AND PLACEMENT OF IMPLANT Bilateral 09/17/2017   Procedure: REMOVAL OF RIGHT TISSUE EXPANDER, LEFT CHEST TISSUE EXPANSION;  Surgeon: Misty Limbo, MD;   Location: [place];  Service: Plastics;  Laterality: Bilateral;  . TUBAL LIGATION      There were no vitals filed for this visit.   Subjective Assessment - 01/13/18 1607    Subjective  "Misty Lynch may have had some concern about the right side; the left side I think I'm doing okay. "I've had some left shoulder pain and went to my primary care doctor about it.  I've been wondering if it was because I've been using this arm more." Had left shoulder trouble a year ago and went to PT for it then.  It did get better." Primary care doctor gave her medicine for muscle spasms and some exercises; it has gotten somewhat better.    Pertinent History  Patient was diagnosed on 06/24/17 with left grade 1-2 invasive lobular carcinoma breast cancer. There were 2 areas of cancer located in the upper inner quadrant and upper outer quadrant which is an area of distortion with calcs. It is ER/PR positive and HER2 negative with a Ki67 of 2%. She had physical therapy earlier this year (patient estimated she had 8 visits) for left shoulder pain and stiffness which she reports is still an issue for her at times.  Pt underwent double mastectomy (4 lymph nodes removed on the left ) on 08/23/2017 with immediate expander placement.  She had a staph infection on right side and had expander removal on 09/17/2017 then had expander replaced in January. Just took stitches out last Wednesday. (Plastic surgeon is Dr. Iran Lynch.) Left expander has  been filled fully; the right she is slowly doing the filling. She will have implant sometime after April. She will be going to [place] for 10 days from April 5th.  She had no radiation or chemo.  HTN controlled with meds.     Patient Stated Goals  "Misty Lynch sent me."    Currently in Pain?  No/denies Just an awareness of expanders being in place.         [facility] PT Assessment - 01/13/18 0001      Assessment   Medical Diagnosis  Left breast cancer with bilateral mastectomy    Referring Provider   Misty Lynch Dr. Ashley Lynch is plastic surgeon    Onset Date/Surgical Date  06/24/17    Hand Dominance  Right    Prior Therapy  none in the past year      Precautions   Precaution Comments  cancer precautions      Restrictions   Weight Bearing Restrictions  No      Balance Screen   Has the patient fallen in the past 6 months  No    Has the patient had a decrease in activity level because of a fear of falling?   No    Is the patient reluctant to leave their home because of a fear of falling?   No      Home Environment   Living Environment  Private residence    Living Arrangements  Spouse/significant other    Type of [place] or work area in basement;One level      Prior Function   Level of Independence  Independent    Vocation  Full time employment    Geophysicist/field seismologist service; on phone and computer; no longer carrying and lifting up to 25#    Leisure  no regular exercise      Cognition   Overall Cognitive Status  Within Functional Limits for tasks assessed      Observation/Other Assessments   Observations  right breast not expanded compared to left    Skin Integrity  incision on left side looks fine, well-healed, and expander has been filled; on right side, she has two incisions that are roughly parallel and 4-5 inches long, one inferior to the nipple and one just inferior to the breast; the one just below the nipple has a small area that still has a scab on it      AROM   Right Shoulder Flexion  146 Degrees in sitting    Right Shoulder ABduction  128 Degrees area of tightness at Rt. lower axilla with "pucker"    Right Shoulder Internal Rotation  70 Degrees in supine    Right Shoulder External Rotation  68 Degrees    Left Shoulder Flexion  148 Degrees    Left Shoulder ABduction  151 Degrees    Left Shoulder Internal Rotation  64 Degrees    Left Shoulder External Rotation  67 Degrees             Objective measurements  completed on examination: See above findings.                   PT Long Term Goals - 01/13/18 1655      PT LONG TERM GOAL #1   Title  Pt. will show right shoulder abduction to at least 140 degrees actively.    Baseline  128 at time of re-eval on 01/13/18    Time  4    Period  Weeks    Status  New      PT LONG TERM GOAL #2   Title  Pt. will show both right and left shoulder flexion to at least 150 degrees.    Time  4    Period  Weeks    Status  New      Breast Clinic Goals - 07/03/17 1116      Patient will be able to verbalize understanding of pertinent lymphedema risk reduction practices relevant to her diagnosis specifically related to skin care.   Time  1    Period  Days    Status  Achieved      Patient will be able to return demonstrate and/or verbalize understanding of the post-op home exercise program related to regaining shoulder range of motion.   Time  1    Period  Days    Status  Achieved      Patient will be able to verbalize understanding of the importance of attending the postoperative After Breast Cancer Class for further lymphedema risk reduction education and therapeutic exercise.   Time  1    Period  Days    Status  Achieved            Plan - 01/13/18 1648    Clinical Impression Statement  This is a patient known to this clinic from treatment a few months ago.  She returns now at the suggestion of Misty Lynch.  Pt. had had her right expander removed because of an infection and then had it replaced in January of this year.  She is nearly healed from that, but it has taken some time. First because of the infection and then because of the subsequent surgery, she was not doing stretches for some time.  Today her right shoulder ROM measurements show some decreases; left is also decreased, but not as much.  She does have tight tissue including a spot just inferior to right axilla where tissue "puckers"--it looks like there is a small scar there,  but there is not.  She does not really have pain at this time.  She had left shoulder pain and was treated in therapy a year+ ago, which helped that.  That pain seemed to have flared recently, but pt. saw her primary care doctor, got some exercises for it, and it has gotten somewhat better. Today we decided that the patient shoulder resume the stretches she had learned here in the past.  She is going out of the country in a couple of weeks, and was advised to continue those while she is gone. She will then return here in about four weeks for [place] to assess her ROM again and ensure that she is moving in the right direction with that, prior to her having implants placed later this year (possibly May).    Clinical Decision Making  Low this is a re-eval today    Rehab Potential  Good    Clinical Impairments Affecting Rehab Potential  Pre-existing shoulder dysfunction, delayed healing of right chest     PT Frequency  Monthy    PT Duration  4 weeks    PT Treatment/Interventions  ADLs/Self Care Home Management;Therapeutic exercise;Patient/family education;Manual techniques;Passive range of motion    PT Next Visit Plan  Pt. to return in about four weeks for reassessment of range of motion and of myofascial restrictions, to see if she has improved or might need therapy treatment sessions scheduled.    PT  Home Exercise Plan  Post op shoulder ROM HEP, dowel exercises--focus on stretching, not strengthening    Consulted and Agree with Plan of Care  Patient       Patient will benefit from skilled therapeutic intervention in order to improve the following deficits and impairments:  Decreased range of motion, Impaired UE functional use, Decreased skin integrity, Increased fascial restricitons  Visit Diagnosis: Aftercare following surgery for neoplasm - Plan: PT plan of care cert/re-cert  Stiffness of right shoulder, not elsewhere classified - Plan: PT plan of care cert/re-cert  Stiffness of left shoulder, not  elsewhere classified - Plan: PT plan of care cert/re-cert     Problem List Patient Active Problem List   Diagnosis Date Noted  . Breast cancer, left breast ([place]) 08/23/2017  . Malignant neoplasm of upper-outer quadrant of left breast in female, estrogen receptor positive ([place]) 07/02/2017    [place] 01/13/2018, 5:00 PM  [place], [place], [address] Phone: [phone]   Fax:  [phone]  Name: AMARIZ FLAMENCO MRN: [id number] Date of Birth: 11-19-1955  Wilberta Dorvil, PT 01/13/18 5:00 PM PHYSICAL THERAPY DISCHARGE SUMMARY  Visits from Start of Care: 1  Current functional level related to goals / functional outcomes: Unknown    Remaining deficits: unknown   Education / Equipment: As above  Plan: Patient agrees to discharge.  Patient goals were not met. Patient is being discharged due to being pleased with the current functional level.  ?????    Maudry Diego, PT 06/04/18 11:44 AM

## 2018-02-10 ENCOUNTER — Ambulatory Visit: Payer: 59 | Attending: Plastic Surgery | Admitting: Physical Therapy

## 2018-03-18 ENCOUNTER — Encounter (HOSPITAL_BASED_OUTPATIENT_CLINIC_OR_DEPARTMENT_OTHER): Payer: Self-pay | Admitting: *Deleted

## 2018-03-18 ENCOUNTER — Other Ambulatory Visit: Payer: Self-pay

## 2018-03-18 NOTE — Pre-Procedure Instructions (Signed)
To come for BMET and to pick up Ensure pre-surgery drink 10 oz. - to drink by 0415 DOS.

## 2018-03-19 ENCOUNTER — Encounter (HOSPITAL_BASED_OUTPATIENT_CLINIC_OR_DEPARTMENT_OTHER)
Admission: RE | Admit: 2018-03-19 | Discharge: 2018-03-19 | Disposition: A | Discharge status: Home / Self Care | Payer: 59 | Source: Ambulatory Visit | Attending: Plastic Surgery | Admitting: Plastic Surgery

## 2018-03-19 DIAGNOSIS — Y838 Other surgical procedures as the cause of abnormal reaction of the patient, or of later complication, without mention of misadventure at the time of the procedure: Secondary | ICD-10-CM | POA: Diagnosis not present

## 2018-03-19 DIAGNOSIS — Z853 Personal history of malignant neoplasm of breast: Secondary | ICD-10-CM | POA: Diagnosis not present

## 2018-03-19 DIAGNOSIS — Z9013 Acquired absence of bilateral breasts and nipples: Secondary | ICD-10-CM | POA: Diagnosis not present

## 2018-03-19 DIAGNOSIS — I10 Essential (primary) hypertension: Secondary | ICD-10-CM | POA: Diagnosis not present

## 2018-03-19 DIAGNOSIS — L03313 Cellulitis of chest wall: Secondary | ICD-10-CM | POA: Diagnosis not present

## 2018-03-19 DIAGNOSIS — L7634 Postprocedural seroma of skin and subcutaneous tissue following other procedure: Secondary | ICD-10-CM | POA: Diagnosis not present

## 2018-03-19 DIAGNOSIS — Z421 Encounter for breast reconstruction following mastectomy: Secondary | ICD-10-CM | POA: Diagnosis present

## 2018-03-19 LAB — BASIC METABOLIC PANEL
ANION GAP: 11 (ref 5–15)
BUN: 9 mg/dL (ref 6–20)
CHLORIDE: 98 mmol/L — AB (ref 101–111)
CO2: 29 mmol/L (ref 22–32)
CREATININE: 0.93 mg/dL (ref 0.44–1.00)
Calcium: 9.3 mg/dL (ref 8.9–10.3)
GFR calc non Af Amer: >60 mL/min (ref >60)
Glucose, Bld: 109 mg/dL — ABNORMAL HIGH (ref 65–99)
Potassium: 3.8 mmol/L (ref 3.5–5.1)
Sodium: 138 mmol/L (ref 135–145)

## 2018-03-19 NOTE — Progress Notes (Signed)
Ensure pre surgery drink given with instructions to complete by 0415 dos, pt verbalized understanding. 

## 2018-03-19 NOTE — H&P (Signed)
Patient ID: Misty Lynch is a 62 y.o. female.  HPI  3.5 months post replacement right TE and ADM. Onset redness chest last week, aspirated small clear watery seroma last week and started on Bactrim. GS of this with no org, no WBC. Unfortunately culture was not done due to lab error in entering order. Returns noting some improvement in erythema, no fevers, no drainage. She is scheduled for implant exchange next week.   Post op bilateral NSM with TE reconstruction. Developed cellulitis and drainage right chest, and underwent removal ADM and TE on right. Culture MSSA, completed Bactrim. Also experienced nipple necrosis on this side post debridement.   Presented following screening MMG with two areas calcifications left breast, 7.1 cm apart. Biopsy left UOQ with ILC with LCIS. Biopsy left UIQ with ILC with ADH. Both ER/PR+, Her2-. [place] axilla negative.  Final pathology right breast with ALH, flat epithelial atypia, 0/1 LN. Left breast with LCIS, no invasive tumor seen, 0/4 SLN.  On anastrozole.  Prior 38 B, happy with this. 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   Review of Systems     Objective:   Physical Exam  Cardiovascular: Normal rate, regular rhythm and normal heart sounds.   Pulmonary/Chest: Effort normal and breath sounds normal.   Chest: left chest benign, right chest erythema over lower medial pole reconstruction no drainage, palpable fluid over medial lower pole  Assessment:     Left breast ca UOQ ER+ (ILC), Left breast ca UIQ ER+ (ILC) S/p bilateral NSM, TE/ADM (Alloderm) reconstruction S/p removal right TE S/p replacement right TE/ADM Erythema right chest    Plan:    Patient asensate over chest and aspirated approximately 11 cc clear yellow watery seroma fluid from right LIQ reconstruction and culture obatined. Counseled patient that I would recommend  surgery to wash out right breast cavity, and exchange expander or implant at this time as cellulitis has not resolved and has recurrent small seroma. I suspect some of ADM may not be incorporated. Regardless I offered to move implant surgery up to two days from now. Counseled I will plan drain placement on right. Recommend we defer any fat grafting in this setting and can do this if needed in future. We discussed risk is that infection continues and may require removal device whether it be TE or implant. I feel exchange to implant (vs continued presence of expander) may be beneficial as will be smooth device. She agrees to this plan. Plan removal bilateral TE and placement smooth round saline implants this week. Plan OP surgery, drain on right anticipated.   Natrelle 133MX-13 T 500 ml tissue expanders placed ,  LEFT  fill volume 485 ml.  RIGHT fill volume 445 ml   [name], MD [facility] Plastic & Reconstructive Surgery [phone], pin [phone]

## 2018-03-20 NOTE — Progress Notes (Signed)
Mingo Amber RN attempted to call pt to tell her about time of surgery for tomorrow being changed from 5/28 and to do pre-op, no answer. I called Dr.Thimmappa's office to speak with Shayna to tell her we could not reach her to tell her about pre-op for tomorrow, no answer, just kept giving me prompts. Called Shayne Rayburn and explained to above to her, she will call pt to tell her what time to be here tomorrow.

## 2018-03-21 ENCOUNTER — Encounter (HOSPITAL_BASED_OUTPATIENT_CLINIC_OR_DEPARTMENT_OTHER)
Admission: RE | Disposition: A | Discharge status: Home / Self Care | Payer: Self-pay | Source: Ambulatory Visit | Attending: Plastic Surgery

## 2018-03-21 ENCOUNTER — Encounter (HOSPITAL_BASED_OUTPATIENT_CLINIC_OR_DEPARTMENT_OTHER): Payer: Self-pay | Admitting: Anesthesiology

## 2018-03-21 ENCOUNTER — Ambulatory Visit (HOSPITAL_BASED_OUTPATIENT_CLINIC_OR_DEPARTMENT_OTHER): Payer: 59 | Admitting: Anesthesiology

## 2018-03-21 ENCOUNTER — Ambulatory Visit (HOSPITAL_BASED_OUTPATIENT_CLINIC_OR_DEPARTMENT_OTHER)
Admission: RE | Admit: 2018-03-21 | Discharge: 2018-03-21 | Disposition: A | Discharge status: Home / Self Care | Payer: 59 | Source: Ambulatory Visit | Attending: Plastic Surgery | Admitting: Plastic Surgery

## 2018-03-21 ENCOUNTER — Other Ambulatory Visit: Payer: Self-pay

## 2018-03-21 DIAGNOSIS — Y838 Other surgical procedures as the cause of abnormal reaction of the patient, or of later complication, without mention of misadventure at the time of the procedure: Secondary | ICD-10-CM | POA: Insufficient documentation

## 2018-03-21 DIAGNOSIS — L7634 Postprocedural seroma of skin and subcutaneous tissue following other procedure: Secondary | ICD-10-CM | POA: Insufficient documentation

## 2018-03-21 DIAGNOSIS — Z853 Personal history of malignant neoplasm of breast: Secondary | ICD-10-CM | POA: Insufficient documentation

## 2018-03-21 DIAGNOSIS — I10 Essential (primary) hypertension: Secondary | ICD-10-CM | POA: Insufficient documentation

## 2018-03-21 DIAGNOSIS — Z421 Encounter for breast reconstruction following mastectomy: Secondary | ICD-10-CM | POA: Diagnosis not present

## 2018-03-21 DIAGNOSIS — Z9013 Acquired absence of bilateral breasts and nipples: Secondary | ICD-10-CM | POA: Insufficient documentation

## 2018-03-21 DIAGNOSIS — L03313 Cellulitis of chest wall: Secondary | ICD-10-CM | POA: Insufficient documentation

## 2018-03-21 HISTORY — DX: Gastro-esophageal reflux disease without esophagitis: K21.9

## 2018-03-21 HISTORY — PX: REMOVAL OF BILATERAL TISSUE EXPANDERS WITH PLACEMENT OF BILATERAL BREAST IMPLANTS: SHX6431

## 2018-03-21 SURGERY — REMOVAL, TISSUE EXPANDER, BREAST, BILATERAL, WITH BILATERAL IMPLANT IMPLANT INSERTION
Anesthesia: General | Site: Breast | Laterality: Bilateral

## 2018-03-21 MED ORDER — FENTANYL CITRATE (PF) 100 MCG/2ML IJ SOLN
INTRAMUSCULAR | Status: DC | PRN
  Administered 2018-03-21: 25 ug via INTRAVENOUS
  Administered 2018-03-21: 100 ug via INTRAVENOUS
  Administered 2018-03-21 (×3): 25 ug via INTRAVENOUS

## 2018-03-21 MED ORDER — PROPOFOL 10 MG/ML IV BOLUS
INTRAVENOUS | Status: DC | PRN
  Administered 2018-03-21: 150 mg via INTRAVENOUS

## 2018-03-21 MED ORDER — DEXAMETHASONE SODIUM PHOSPHATE 4 MG/ML IJ SOLN
INTRAMUSCULAR | Status: DC | PRN
  Administered 2018-03-21: 10 mg via INTRAVENOUS

## 2018-03-21 MED ORDER — MIDAZOLAM HCL 2 MG/2ML IJ SOLN
INTRAMUSCULAR | Status: AC
  Filled 2018-03-21: qty 2

## 2018-03-21 MED ORDER — ONDANSETRON HCL 4 MG/2ML IJ SOLN
INTRAMUSCULAR | Status: AC
  Filled 2018-03-21: qty 2

## 2018-03-21 MED ORDER — ACETAMINOPHEN 500 MG PO TABS
ORAL_TABLET | ORAL | Status: AC
  Filled 2018-03-21: qty 2

## 2018-03-21 MED ORDER — EPHEDRINE SULFATE 50 MG/ML IJ SOLN
INTRAMUSCULAR | Status: AC
  Filled 2018-03-21: qty 1

## 2018-03-21 MED ORDER — LIDOCAINE HCL (CARDIAC) PF 100 MG/5ML IV SOSY
PREFILLED_SYRINGE | INTRAVENOUS | Status: AC
  Filled 2018-03-21: qty 5

## 2018-03-21 MED ORDER — MIDAZOLAM HCL 2 MG/2ML IJ SOLN: 1.0000 mg | INTRAMUSCULAR | Status: DC | PRN

## 2018-03-21 MED ORDER — PROMETHAZINE HCL 25 MG/ML IJ SOLN: 6.2500 mg | INTRAMUSCULAR | Status: DC | PRN

## 2018-03-21 MED ORDER — GABAPENTIN 300 MG PO CAPS
ORAL_CAPSULE | ORAL | Status: AC
  Filled 2018-03-21: qty 1

## 2018-03-21 MED ORDER — LIDOCAINE HCL (CARDIAC) PF 100 MG/5ML IV SOSY
PREFILLED_SYRINGE | INTRAVENOUS | Status: DC | PRN
  Administered 2018-03-21: 30 mg via INTRAVENOUS

## 2018-03-21 MED ORDER — GLYCOPYRROLATE 0.2 MG/ML IJ SOLN
INTRAMUSCULAR | Status: DC | PRN
  Administered 2018-03-21: 0.1 mg via INTRAVENOUS

## 2018-03-21 MED ORDER — CHLORHEXIDINE GLUCONATE CLOTH 2 % EX PADS: 6.0000 | MEDICATED_PAD | Freq: Once | CUTANEOUS | Status: DC

## 2018-03-21 MED ORDER — VANCOMYCIN HCL IN DEXTROSE 1-5 GM/200ML-% IV SOLN
1000.0000 mg | INTRAVENOUS | Status: AC
  Administered 2018-03-21: 1000 mg via INTRAVENOUS

## 2018-03-21 MED ORDER — KETOROLAC TROMETHAMINE 30 MG/ML IJ SOLN
INTRAMUSCULAR | Status: AC
  Filled 2018-03-21: qty 1

## 2018-03-21 MED ORDER — OXYCODONE HCL 5 MG PO TABS
5.0000 mg | ORAL_TABLET | Freq: Once | ORAL | Status: AC
Start: 2018-03-21 — End: 2018-03-21
  Administered 2018-03-21: 5 mg via ORAL

## 2018-03-21 MED ORDER — OXYCODONE HCL 5 MG PO TABS
ORAL_TABLET | ORAL | Status: AC
Start: 2018-03-21 — End: ?
  Filled 2018-03-21: qty 1

## 2018-03-21 MED ORDER — SUGAMMADEX SODIUM 200 MG/2ML IV SOLN
INTRAVENOUS | Status: DC | PRN
  Administered 2018-03-21: 200 mg via INTRAVENOUS

## 2018-03-21 MED ORDER — ACETAMINOPHEN 500 MG PO TABS
1000.0000 mg | ORAL_TABLET | ORAL | Status: AC
  Administered 2018-03-21: 1000 mg via ORAL

## 2018-03-21 MED ORDER — VANCOMYCIN HCL IN DEXTROSE 1-5 GM/200ML-% IV SOLN
INTRAVENOUS | Status: AC
  Filled 2018-03-21: qty 200

## 2018-03-21 MED ORDER — FENTANYL CITRATE (PF) 100 MCG/2ML IJ SOLN: 50.0000 ug | INTRAMUSCULAR | Status: DC | PRN

## 2018-03-21 MED ORDER — SODIUM CHLORIDE 0.9 % IV SOLN
INTRAVENOUS | Status: DC | PRN
  Administered 2018-03-21: 1000 mL

## 2018-03-21 MED ORDER — LACTATED RINGERS IV SOLN
INTRAVENOUS | Status: DC
  Administered 2018-03-21 (×2): via INTRAVENOUS

## 2018-03-21 MED ORDER — MIDAZOLAM HCL 5 MG/5ML IJ SOLN
INTRAMUSCULAR | Status: DC | PRN
  Administered 2018-03-21: 2 mg via INTRAVENOUS

## 2018-03-21 MED ORDER — GABAPENTIN 300 MG PO CAPS
300.0000 mg | ORAL_CAPSULE | ORAL | Status: AC
  Administered 2018-03-21: 300 mg via ORAL

## 2018-03-21 MED ORDER — KETOROLAC TROMETHAMINE 30 MG/ML IJ SOLN: 30.0000 mg | Freq: Once | INTRAMUSCULAR | Status: DC | PRN

## 2018-03-21 MED ORDER — ROCURONIUM 10MG/ML (10ML) SYRINGE FOR MEDFUSION PUMP - OPTIME
INTRAVENOUS | Status: DC | PRN
  Administered 2018-03-21: 10 mg via INTRAVENOUS
  Administered 2018-03-21: 50 mg via INTRAVENOUS

## 2018-03-21 MED ORDER — HYDROCODONE-ACETAMINOPHEN 5-325 MG PO TABS: 1.0000 | ORAL_TABLET | ORAL | 0 refills | Status: DC | PRN

## 2018-03-21 MED ORDER — 0.9 % SODIUM CHLORIDE (POUR BTL) OPTIME
TOPICAL | Status: DC | PRN
  Administered 2018-03-21: 1000 mL

## 2018-03-21 MED ORDER — FENTANYL CITRATE (PF) 100 MCG/2ML IJ SOLN
INTRAMUSCULAR | Status: AC
  Filled 2018-03-21: qty 2

## 2018-03-21 MED ORDER — CELECOXIB 200 MG PO CAPS
ORAL_CAPSULE | ORAL | Status: AC
  Filled 2018-03-21: qty 1

## 2018-03-21 MED ORDER — ROCURONIUM BROMIDE 50 MG/5ML IV SOLN
INTRAVENOUS | Status: AC
  Filled 2018-03-21: qty 1

## 2018-03-21 MED ORDER — KETOROLAC TROMETHAMINE 30 MG/ML IJ SOLN
INTRAMUSCULAR | Status: DC | PRN
  Administered 2018-03-21: 30 mg via INTRAVENOUS

## 2018-03-21 MED ORDER — SUGAMMADEX SODIUM 200 MG/2ML IV SOLN
INTRAVENOUS | Status: AC
  Filled 2018-03-21: qty 2

## 2018-03-21 MED ORDER — CELECOXIB 200 MG PO CAPS
200.0000 mg | ORAL_CAPSULE | ORAL | Status: AC
  Administered 2018-03-21: 200 mg via ORAL

## 2018-03-21 MED ORDER — SCOPOLAMINE 1 MG/3DAYS TD PT72: 1.0000 | MEDICATED_PATCH | Freq: Once | TRANSDERMAL | Status: DC | PRN

## 2018-03-21 MED ORDER — EPHEDRINE SULFATE 50 MG/ML IJ SOLN
INTRAMUSCULAR | Status: DC | PRN
  Administered 2018-03-21: 5 mg via INTRAVENOUS
  Administered 2018-03-21: 25 mg via INTRAVENOUS

## 2018-03-21 MED ORDER — HYDROMORPHONE HCL 1 MG/ML IJ SOLN: 0.2500 mg | INTRAMUSCULAR | Status: DC | PRN

## 2018-03-21 MED ORDER — ONDANSETRON HCL 4 MG/2ML IJ SOLN
INTRAMUSCULAR | Status: DC | PRN
  Administered 2018-03-21: 4 mg via INTRAVENOUS

## 2018-03-21 SURGICAL SUPPLY — 60 items
ADH SKN CLS APL DERMABOND .7 (GAUZE/BANDAGES/DRESSINGS) ×2
BAG DECANTER FOR FLEXI CONT (MISCELLANEOUS) ×3 IMPLANT
BINDER BREAST XXLRG (GAUZE/BANDAGES/DRESSINGS) ×2 IMPLANT
BLADE SURG 10 STRL SS (BLADE) ×6 IMPLANT
BLADE SURG 15 STRL LF DISP TIS (BLADE) IMPLANT
BLADE SURG 15 STRL SS (BLADE) ×3
BNDG GAUZE ELAST 4 BULKY (GAUZE/BANDAGES/DRESSINGS) ×6 IMPLANT
CANISTER SUCT 1200ML W/VALVE (MISCELLANEOUS) ×7 IMPLANT
CHLORAPREP W/TINT 26ML (MISCELLANEOUS) ×5 IMPLANT
COVER BACK TABLE 60X90IN (DRAPES) ×3 IMPLANT
COVER MAYO STAND STRL (DRAPES) ×3 IMPLANT
DERMABOND ADVANCED (GAUZE/BANDAGES/DRESSINGS) ×4
DERMABOND ADVANCED .7 DNX12 (GAUZE/BANDAGES/DRESSINGS) ×2 IMPLANT
DRAIN CHANNEL 15F RND FF W/TCR (WOUND CARE) ×2 IMPLANT
DRAPE TOP ARMCOVERS (MISCELLANEOUS) ×3 IMPLANT
DRAPE U-SHAPE 76X120 STRL (DRAPES) ×3 IMPLANT
DRSG PAD ABDOMINAL 8X10 ST (GAUZE/BANDAGES/DRESSINGS) ×6 IMPLANT
DRSG TEGADERM 4X10 (GAUZE/BANDAGES/DRESSINGS) ×3 IMPLANT
ELECT BLADE 4.0 EZ CLEAN MEGAD (MISCELLANEOUS) ×3
ELECT COATED BLADE 2.86 ST (ELECTRODE) ×3 IMPLANT
ELECT REM PT RETURN 9FT ADLT (ELECTROSURGICAL) ×3
ELECTRODE BLDE 4.0 EZ CLN MEGD (MISCELLANEOUS) ×1 IMPLANT
ELECTRODE REM PT RTRN 9FT ADLT (ELECTROSURGICAL) ×1 IMPLANT
EVACUATOR SILICONE 100CC (DRAIN) ×3 IMPLANT
GLOVE BIO SURGEON STRL SZ 6 (GLOVE) ×8 IMPLANT
GOWN STRL REUS W/ TWL LRG LVL3 (GOWN DISPOSABLE) ×2 IMPLANT
GOWN STRL REUS W/TWL LRG LVL3 (GOWN DISPOSABLE) ×6
IMPL BREAST SALINE 510CC (Breast) ×2 IMPLANT
IMPLANT BREAST SALINE 510CC (Breast) ×6 IMPLANT
IV NS 1000ML (IV SOLUTION) ×3
IV NS 1000ML BAXH (IV SOLUTION) IMPLANT
KIT FILL SYSTEM UNIVERSAL (SET/KITS/TRAYS/PACK) IMPLANT
PACK BASIN DAY SURGERY FS (CUSTOM PROCEDURE TRAY) ×3 IMPLANT
PENCIL BUTTON HOLSTER BLD 10FT (ELECTRODE) ×3 IMPLANT
PIN SAFETY STERILE (MISCELLANEOUS) ×2 IMPLANT
SHEET MEDIUM DRAPE 40X70 STRL (DRAPES) ×3 IMPLANT
SIZER BREAST SGL USE 510CC (SIZER) ×3
SIZER BREAST SGL USE HP 650CC (SIZER) ×3
SIZER BRST SGL USE 510CC (SIZER) IMPLANT
SIZER BRST SGL USE HP 650CC (SIZER) IMPLANT
SLEEVE SCD COMPRESS KNEE MED (MISCELLANEOUS) ×3 IMPLANT
SPONGE LAP 18X18 RF (DISPOSABLE) ×6 IMPLANT
STAPLER VISISTAT 35W (STAPLE) ×3 IMPLANT
SUT ETHILON 2 0 FS 18 (SUTURE) ×2 IMPLANT
SUT MNCRL AB 4-0 PS2 18 (SUTURE) ×6 IMPLANT
SUT PDS AB 2-0 CT2 27 (SUTURE) ×2 IMPLANT
SUT PROLENE 5 0 P 3 (SUTURE) ×2 IMPLANT
SUT VIC AB 3-0 PS1 18 (SUTURE)
SUT VIC AB 3-0 PS1 18XBRD (SUTURE) IMPLANT
SUT VIC AB 3-0 SH 27 (SUTURE) ×6
SUT VIC AB 3-0 SH 27X BRD (SUTURE) ×2 IMPLANT
SUT VICRYL 4-0 PS2 18IN ABS (SUTURE) ×6 IMPLANT
SWAB COLLECTION DEVICE MRSA (MISCELLANEOUS) ×2 IMPLANT
SYR BULB IRRIGATION 50ML (SYRINGE) ×6 IMPLANT
TOWEL OR 17X24 6PK STRL BLUE (TOWEL DISPOSABLE) ×6 IMPLANT
TUBE ANAEROBIC SPECIMEN COL (MISCELLANEOUS) ×2 IMPLANT
TUBE CONNECTING 20'X1/4 (TUBING) ×2
TUBE CONNECTING 20X1/4 (TUBING) ×3 IMPLANT
UNDERPAD 30X30 (UNDERPADS AND DIAPERS) ×6 IMPLANT
YANKAUER SUCT BULB TIP NO VENT (SUCTIONS) ×3 IMPLANT

## 2018-03-21 NOTE — Transfer of Care (Signed)
Immediate Anesthesia Transfer of Care Note  Patient: Misty Lynch  Procedure(s) Performed: REMOVAL OF BILATERAL TISSUE EXPANDERS WITH PLACEMENT OF BILATERAL BREAST IMPLANTS (Bilateral Breast)  Patient Location: PACU  Anesthesia Type:General  Level of Consciousness: awake and alert   Airway & Oxygen Therapy: Patient Spontanous Breathing and Patient connected to face mask oxygen  Post-op Assessment: Report given to RN and Post -op Vital signs reviewed and stable  Post vital signs: Reviewed and stable  Last Vitals:  Vitals Value Taken Time  BP 151/67 03/21/2018  1:45 PM  Temp    Pulse 94 03/21/2018  1:48 PM  Resp 22 03/21/2018  1:48 PM  SpO2 100 % 03/21/2018  1:48 PM  Vitals shown include unvalidated device data.  Last Pain:  Vitals:   03/21/18 1019  TempSrc: Oral  PainSc: 0-No pain         Complications: No apparent anesthesia complications

## 2018-03-21 NOTE — Op Note (Signed)
Operative Note   DATE OF OPERATION: 5.24.19  LOCATION:  [facility]  SURGICAL DIVISION: Plastic Surgery  PREOPERATIVE DIAGNOSES:  1. History breast cancer 2. Acquired absence breasts 3. Seroma right breast reconstruction  POSTOPERATIVE DIAGNOSES:  same  PROCEDURE:  Removal bilateral tissue expanders and placement saline implants  SURGEON: [name] MD MBA  ASSISTANT: none  ANESTHESIA:  General.   EBL: 25 ml  COMPLICATIONS: None immediate.   INDICATIONS FOR PROCEDURE:  The patient, Misty Lynch, is a 62 y.o. female born on 02-Apr-1956, is here for placement permanent implants following bilateral nipple sparing mastectomies with immediate dual plane expander acellular dermis reconstruction. Her course was complicated by infection right chest with removal expander. This was replaced 3.5 months ago. She has demonstrated seroma over right chest with cellulitis for last week.    FINDINGS: Approximately 10-15 cc of straw colored seroma fluid in right chest, sent for culture. Fully incorporated ADM noted. Natrelle Smooth Round Moderate Profile 510 ml saline implants placed bilateral. REF [id number] RIGHT SN [id number] fill volume 510 ml LEFT [id number] fill volume 530 ml  DESCRIPTION OF PROCEDURE:  The patient's operative site was marked with the patient in the preoperative area. The patient was taken to the operating room. SCDs were placed and IV antibiotics were given. The patient's operative site was prepped and draped in a sterile fashion. A time out was performed and all information was confirmed to be correct.  Incision made in right inframammary fold scar. Incision carried through superficial fascia and acellular dermis to implant cavity. As noted, small amount clear seroma fluid noted and culture obtained. Expander removed. Full incorporation ADM noted. Cavity treated with currettage and irrigated with 1 L saline. Excision of depressed scar lower pole breast completed -  this represented area of prior perforation and was closed 5-0 prolene interrupted sutures. Capsulotomies performed medially and superiorly and radial scoring completed of anterior capsule. Sizer placed.  Incision made in left inframammary fold scar. Incision carried through superficial fascia to implant cavity.Expander removed. Full incorporation ADM noted.  She demonstrated some asymmetry IMF and lower pole capsule incised. The superficial fascia of caudal IMF incision then sutured to chest wall with interrupted 2-0 PDS. Capsulotomies performed medially and superiorly. Sizer placed. Natrelle Smooth Round Moderate Profile 510 ml saline implant selected.   Sizers removed and bilateral cavities irrigated with saline solution containing Ancef, gentamicin, and bacitracin. Hemostasis ensured. Over right chest 15 Fr JP drain placed and secured with 2-0 nylon. Bilateral cavities irrigated with betadine solution. Over right chest, implant prepared and placed in cavity, filled to 510 ml. Fill tubing removed and implant orientation, closure fill tab ensured. Closure completed with 3-0 vicryl in superficial fascia, 4-0 vicryl in dermis and 4-0 monocryl subcuticular skin closure. Over left chest, implant placed and filled to 530 ml. Closure completed in similar fashion. Dermabond applied. Tegaderm applied over right chest incisions. Dry dressing and breast binder applied.  The patient was allowed to wake from anesthesia, extubated and taken to the recovery room in satisfactory condition.   SPECIMENS: right chest seroma for culture  DRAINS: 15 Fr JP in right breast reconstruction  [name], MD [facility] Plastic & Reconstructive Surgery [phone], pin [phone]

## 2018-03-21 NOTE — Discharge Instructions (Signed)

## 2018-03-21 NOTE — Anesthesia Preprocedure Evaluation (Signed)
Anesthesia Evaluation  Patient identified by MRN, date of birth, ID band Patient awake    Reviewed: Allergy & Precautions, NPO status , Patient's Chart, lab work & pertinent test results  Airway Mallampati: II  TM Distance: >3 FB Neck ROM: Full    Dental no notable dental hx.    Pulmonary neg pulmonary ROS,    Pulmonary exam normal breath sounds clear to auscultation       Cardiovascular hypertension, Normal cardiovascular exam Rhythm:Regular Rate:Normal     Neuro/Psych negative neurological ROS  negative psych ROS   GI/Hepatic Neg liver ROS, GERD  ,  Endo/Other  negative endocrine ROS  Renal/GU negative Renal ROS  negative genitourinary   Musculoskeletal negative musculoskeletal ROS (+)   Abdominal   Peds negative pediatric ROS (+)  Hematology negative hematology ROS (+)   Anesthesia Other Findings   Reproductive/Obstetrics negative OB ROS                             Anesthesia Physical Anesthesia Plan  ASA: II  Anesthesia Plan: General   Post-op Pain Management:    Induction: Intravenous  PONV Risk Score and Plan: 3 and Ondansetron, Dexamethasone and Treatment may vary due to age or medical condition  Airway Management Planned: LMA  Additional Equipment:   Intra-op Plan:   Post-operative Plan: Extubation in OR  Informed Consent: I have reviewed the patients History and Physical, chart, labs and discussed the procedure including the risks, benefits and alternatives for the proposed anesthesia with the patient or authorized representative who has indicated his/her understanding and acceptance.   Dental advisory given  Plan Discussed with: CRNA and Surgeon  Anesthesia Plan Comments:         Anesthesia Quick Evaluation

## 2018-03-21 NOTE — Interval H&P Note (Signed)
History and Physical Interval Note:  03/21/2018 10:52 AM  Misty Lynch  has presented today for surgery, with the diagnosis of history of breast cancer, acquired absence of breasts  The various methods of treatment have been discussed with the patient and family. After consideration of risks, benefits and other options for treatment, the patient has consented to  Procedure(s): REMOVAL OF BILATERAL TISSUE EXPANDERS WITH PLACEMENT OF BILATERAL BREAST IMPLANTS (Bilateral) as a surgical intervention .  The patient's history has been reviewed, patient examined, no change in status, stable for surgery.  I have reviewed the patient's chart and labs.  Questions were answered to the patient's satisfaction.     Jovanni Rash

## 2018-03-21 NOTE — Anesthesia Postprocedure Evaluation (Signed)
Anesthesia Post Note  Patient: Misty Lynch  Procedure(s) Performed: REMOVAL OF BILATERAL TISSUE EXPANDERS WITH PLACEMENT OF BILATERAL BREAST IMPLANTS (Bilateral Breast)     Patient location during evaluation: PACU Anesthesia Type: General Level of consciousness: awake and alert Pain management: pain level controlled Vital Signs Assessment: post-procedure vital signs reviewed and stable Respiratory status: spontaneous breathing, nonlabored ventilation, respiratory function stable and patient connected to nasal cannula oxygen Cardiovascular status: blood pressure returned to baseline and stable Postop Assessment: no apparent nausea or vomiting Anesthetic complications: no    Last Vitals:  Vitals:   03/21/18 1400 03/21/18 1402  BP:  (!) 155/72  Pulse: 87 91  Resp: 20 19  Temp:    SpO2: 97% 97%    Last Pain:  Vitals:   03/21/18 1402  TempSrc:   PainSc: 4                  Simisola Sandles S

## 2018-03-21 NOTE — Anesthesia Procedure Notes (Signed)
Procedure Name: Intubation Date/Time: 03/21/2018 11:20 AM Performed by: Marrianne Mood, CRNA Pre-anesthesia Checklist: Patient identified, Emergency Drugs available, Suction available, Patient being monitored and Timeout performed Patient Re-evaluated:Patient Re-evaluated prior to induction Oxygen Delivery Method: Circle system utilized Preoxygenation: Pre-oxygenation with 100% oxygen Induction Type: IV induction Ventilation: Mask ventilation without difficulty Laryngoscope Size: Miller and 3 Grade View: Grade II Tube type: Oral Tube size: 7.0 mm Number of attempts: 1 Airway Equipment and Method: Stylet and Oral airway Placement Confirmation: ETT inserted through vocal cords under direct vision,  positive ETCO2 and breath sounds checked- equal and bilateral Secured at: 20 cm Tube secured with: Tape Dental Injury: Teeth and Oropharynx as per pre-operative assessment

## 2018-03-25 ENCOUNTER — Encounter (HOSPITAL_BASED_OUTPATIENT_CLINIC_OR_DEPARTMENT_OTHER): Payer: Self-pay | Admitting: Plastic Surgery

## 2018-03-26 LAB — AEROBIC/ANAEROBIC CULTURE W GRAM STAIN (SURGICAL/DEEP WOUND): Culture: NO GROWTH

## 2018-03-26 LAB — AEROBIC/ANAEROBIC CULTURE (SURGICAL/DEEP WOUND)

## 2018-05-24 ENCOUNTER — Other Ambulatory Visit: Payer: Self-pay | Admitting: Family Medicine

## 2018-05-24 DIAGNOSIS — R9389 Abnormal findings on diagnostic imaging of other specified body structures: Secondary | ICD-10-CM

## 2018-06-03 ENCOUNTER — Ambulatory Visit
Admission: RE | Admit: 2018-06-03 | Discharge: 2018-06-03 | Disposition: A | Discharge status: Home / Self Care | Payer: 59 | Source: Ambulatory Visit | Attending: Family Medicine | Admitting: Family Medicine

## 2018-06-03 DIAGNOSIS — R9389 Abnormal findings on diagnostic imaging of other specified body structures: Secondary | ICD-10-CM

## 2018-06-24 ENCOUNTER — Other Ambulatory Visit: Payer: Self-pay | Admitting: Family Medicine

## 2018-06-24 DIAGNOSIS — E7800 Pure hypercholesterolemia, unspecified: Secondary | ICD-10-CM

## 2018-07-02 ENCOUNTER — Telehealth: Payer: Self-pay | Admitting: Hematology and Oncology

## 2018-07-02 ENCOUNTER — Inpatient Hospital Stay: Payer: 59 | Attending: Hematology and Oncology | Admitting: Hematology and Oncology

## 2018-07-02 DIAGNOSIS — N951 Menopausal and female climacteric states: Secondary | ICD-10-CM | POA: Insufficient documentation

## 2018-07-02 DIAGNOSIS — Z9013 Acquired absence of bilateral breasts and nipples: Secondary | ICD-10-CM | POA: Insufficient documentation

## 2018-07-02 DIAGNOSIS — C50412 Malignant neoplasm of upper-outer quadrant of left female breast: Secondary | ICD-10-CM | POA: Insufficient documentation

## 2018-07-02 DIAGNOSIS — Z170 Estrogen receptor positive status [ER+]: Secondary | ICD-10-CM

## 2018-07-02 DIAGNOSIS — M8580 Other specified disorders of bone density and structure, unspecified site: Secondary | ICD-10-CM

## 2018-07-02 DIAGNOSIS — R0789 Other chest pain: Secondary | ICD-10-CM | POA: Diagnosis not present

## 2018-07-02 MED ORDER — ANASTROZOLE 1 MG PO TABS: 1.0000 mg | ORAL_TABLET | Freq: Every day | ORAL | 3 refills | Status: DC

## 2018-07-02 MED ORDER — CHOLECALCIFEROL 1000 UNIT/10ML PO LIQD: 10.0000 mL | Freq: Every day | ORAL | Status: DC

## 2018-07-02 NOTE — Assessment & Plan Note (Signed)
08/23/2017 left mastectomy: LCIS, invasive cancer not identified in the specimen, 0/4 lymph nodes negative, based on the biopsy, 2 foci of invasive lobular cancer larger span 0.7 cm grade 2 ER 40%, PR 90%, HER-2 negative ratio 1.19, Ki-67 2%, T1BN0 stage I A;  right mastectomy: Atypical lobular hyperplasia  Current treatment: Adjuvant anastrozole 1 mg daily started 09/02/2017  Anastrozole toxicities: Denies any adverse effects to anastrozole  Limitation of range of motion  Breast cancer surveillance: 1.  Breast exam 07/02/2018: Benign 2. mammogram not indicated since she had bilateral mastectomies 3.  Bone density 12/07/2016 osteopenia T score -1.2  Return to clinic 1 year for follow-up

## 2018-07-02 NOTE — Progress Notes (Signed)
Patient Care Team: Kathyrn Lass, MD as PCP - General (Family Medicine) Excell Seltzer, MD as Consulting Physician (General Surgery) Nicholas Lose, MD as Consulting Physician (Hematology and Oncology) Kyung Rudd, MD as Consulting Physician (Radiation Oncology) Delice Bison Charlestine Massed, NP as Nurse Practitioner (Hematology and Oncology)  DIAGNOSIS:  Encounter Diagnosis  Name Primary?  . Malignant neoplasm of upper-outer quadrant of left breast in female, estrogen receptor positive ([place])     SUMMARY OF ONCOLOGIC HISTORY:   Malignant neoplasm of upper-outer quadrant of left breast in female, estrogen receptor positive ([place])   06/24/2017 Initial Diagnosis    Left breast biopsy upper outer quadrant: Invasive lobular cancer with LCIS; upper inner quadrant: Invasive lobular cancer with ADH, grade 1-2, ER 40%, PR 90%, Ki-67 2%, HER-2 negative ratio 1.19; tumor size indeterminate but based on the 2 mm calcifications it is currently being staged as T1 1 N0 stage IA, however we suspect that it is inaccurate clinical staging    08/23/2017 Surgery    Left mastectomy: LCIS, invasive cancer not identified in the specimen, 0/4 lymph nodes negative, based on the biopsy, 2 foci of invasive lobular cancer larger span 0.7 cm grade 2 ER 40%, PR 90%, HER-2 negative ratio 1.19, Ki-67 2%, T1BN0 stage I A;  right mastectomy: Atypical lobular hyperplasia    08/2017 -  Anti-estrogen oral therapy    Anastrozole daily    09/17/2017 Surgery    Breast reconstruction surgery by Dr. Iran Planas     CHIEF COMPLIANT: Follow-up on anastrozole therapy, implant had to be removed  INTERVAL HISTORY: Misty Lynch is a 62 year old with above-mentioned history of bilateral mastectomies and who is currently on antiestrogen therapy.  She appears to be tolerating anastrozole reasonably well.  She has occasional hot flashes.  Denies any arthralgias or myalgias.  Her implant had to be removed and so she is seeing a Pension scheme manager at [facility] for a latissimus dorsi muscle flap reconstruction surgery.  She complains of intermittent discomfort in the chest wall.  REVIEW OF SYSTEMS:   Constitutional: Denies fevers, chills or abnormal weight loss Eyes: Denies blurriness of vision Ears, nose, mouth, throat, and face: Denies mucositis or sore throat Respiratory: Denies cough, dyspnea or wheezes Cardiovascular: Denies palpitation, chest discomfort Gastrointestinal:  Denies nausea, heartburn or change in bowel habits Skin: Denies abnormal skin rashes Lymphatics: Denies new lymphadenopathy or easy bruising Neurological:Denies numbness, tingling or new weaknesses Behavioral/Psych: Mood is stable, no new changes  Extremities: No lower extremity edema Breast: Small wound in the right chest wall that is healing All other systems were reviewed with the patient and are negative.  I have reviewed the past medical history, past surgical history, social history and family history with the patient and they are unchanged from previous note.  ALLERGIES:  is allergic to tetracyclines & related.  MEDICATIONS:  Current Outpatient Medications  Medication Sig Dispense Refill  . amLODipine (NORVASC) 5 MG tablet Take 5 mg by mouth daily.    Marland Kitchen anastrozole (ARIMIDEX) 1 MG tablet Take 1 mg by mouth daily.    . Calcium-Vitamin D-Vitamin K 500-100-40 MG-UNT-MCG CHEW Chew by mouth 2 (two) times daily with a meal.    . desonide (DESOWEN) 0.05 % cream Apply topically 2 (two) times daily.    Marland Kitchen HYDROcodone-acetaminophen (NORCO) 5-325 MG tablet Take 1-2 tablets by mouth every 4 (four) hours as needed for moderate pain. 20 tablet 0  . losartan-hydrochlorothiazide (HYZAAR) 100-25 MG tablet Take 1 tablet by mouth daily.  No current facility-administered medications for this visit.     PHYSICAL EXAMINATION: ECOG PERFORMANCE STATUS: 1 - Symptomatic but completely ambulatory  Vitals:   07/02/18 0848  BP: (!) 156/60  Pulse: (!) 59    Resp: 17  Temp: 98.4 F (36.9 C)  SpO2: 100%   Filed Weights   07/02/18 0848  Weight: 188 lb 14.4 oz (85.7 kg)    GENERAL:alert, no distress and comfortable SKIN: skin color, texture, turgor are normal, no rashes or significant lesions EYES: normal, Conjunctiva are pink and non-injected, sclera clear OROPHARYNX:no exudate, no erythema and lips, buccal mucosa, and tongue normal  NECK: supple, thyroid normal size, non-tender, without nodularity LYMPH:  no palpable lymphadenopathy in the cervical, axillary or inguinal LUNGS: clear to auscultation and percussion with normal breathing effort HEART: regular rate & rhythm and no murmurs and no lower extremity edema ABDOMEN:abdomen soft, non-tender and normal bowel sounds MUSCULOSKELETAL:no cyanosis of digits and no clubbing  NEURO: alert & oriented x 3 with fluent speech, no focal motor/sensory deficits EXTREMITIES: No lower extremity edema BREAST: Bilateral mastectomies no palpable lumps or nodules of concern (exam performed in the presence of a chaperone)  LABORATORY DATA:  I have reviewed the data as listed CMP Latest Ref Rng & Units 03/19/2018 11/25/2017 09/17/2017  Glucose 65 - 99 mg/dL 109(H) 168(H) 120(H)  BUN 6 - 20 mg/dL '9 14 12  '$ Creatinine 0.44 - 1.00 mg/dL 0.93 0.74 0.80  Sodium 135 - 145 mmol/L 138 141 135  Potassium 3.5 - 5.1 mmol/L 3.8 3.1(L) 2.5(LL)  Chloride 101 - 111 mmol/L 98(L) 103 92(L)  CO2 22 - 32 mmol/L '29 28 30  '$ Calcium 8.9 - 10.3 mg/dL 9.3 9.5 8.6(L)  Total Protein 6.4 - 8.3 g/dL - - -  Total Bilirubin 0.20 - 1.20 mg/dL - - -  Alkaline Phos 40 - 150 U/L - - -  AST 5 - 34 U/L - - -  ALT 0 - 55 U/L - - -    Lab Results  Component Value Date   WBC 12.4 (H) 09/17/2017   HGB 11.3 (L) 09/17/2017   HCT 34.0 (L) 09/17/2017   MCV 88.8 09/17/2017   PLT 443 (H) 09/17/2017   NEUTROABS 10.4 (H) 09/17/2017    ASSESSMENT & PLAN:  Malignant neoplasm of upper-outer quadrant of left breast in female, estrogen  receptor positive ([place]) 08/23/2017 left mastectomy: LCIS, invasive cancer not identified in the specimen, 0/4 lymph nodes negative, based on the biopsy, 2 foci of invasive lobular cancer larger span 0.7 cm grade 2 ER 40%, PR 90%, HER-2 negative ratio 1.19, Ki-67 2%, T1BN0 stage I A;  right mastectomy: Atypical lobular hyperplasia  Current treatment: Adjuvant anastrozole 1 mg daily started 09/02/2017  Anastrozole toxicities: Denies any adverse effects to anastrozole Patient is going to see plastic surgery at [facility] because the right breast implant failed and had to be removed.  She still has a small open wound which is healing.  She may have to undergo latissimus dorsi muscle flap reconstruction surgery.  Limitation of range of motion  Breast cancer surveillance: 1.  Breast exam 07/02/2018: Benign 2. mammogram not indicated since she had bilateral mastectomies: Clinical examination did not reveal any lumps or nodules of concern. 3.  Bone density 12/07/2016 osteopenia T score -1.2  Return to clinic 1 year for follow-up   No orders of the defined types were placed in this encounter.  The patient has a good understanding of the overall plan. she agrees with  it. she will call with any problems that may develop before the next visit here.   Harriette Ohara, MD 07/02/18

## 2018-07-02 NOTE — Telephone Encounter (Signed)
Gave pt avs and calendar  °

## 2018-07-07 ENCOUNTER — Ambulatory Visit
Admission: RE | Admit: 2018-07-07 | Discharge: 2018-07-07 | Disposition: A | Discharge status: Home / Self Care | Payer: No Typology Code available for payment source | Source: Ambulatory Visit | Attending: Family Medicine | Admitting: Family Medicine

## 2018-07-07 DIAGNOSIS — E7800 Pure hypercholesterolemia, unspecified: Secondary | ICD-10-CM

## 2018-10-23 NOTE — H&P (Signed)
Subjective:     Patient ID: Misty Lynch is a 62 y.o. female.  Follow-up   7 months  post op saline implant exchange. Onset redness chest week prior to exchange, aspirated small clear watery seroma. At time of exchange similar straw color fluid noted on right, no growth on culture. At 1 week post op visit had exposure implant and this was removed. Plan right breast reconstruction with TE placement latissimus flap.  Post op bilateral NSM with TE reconstruction. Developed cellulitis and drainage right chest, and underwent removal ADM and TE on right. Culture MSSA, completed Bactrim. Also experienced nipple necrosis on this side post debridement.   Presented following screening MMG with two areas calcifications left breast, 7.1 cm apart. Biopsy left UOQ with ILC with LCIS. Biopsy left UIQ with ILC with ADH. Both ER/PR+, Her2-. [place] axilla negative.  Final pathology right breast with ALH, flat epithelial atypia, 0/1 LN. Left breast with LCIS, no invasive tumor seen, 0/4 SLN.  On anastrozole.  Prior 38 B, happy with this. 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       Objective:   Physical Exam  Cardiovascular: Normal rate, regular rhythm and normal heart sounds.  Pulmonary/Chest: Effort normal and breath sounds normal.  Back: no masses Chest: chest soft left with implant in place minimal rippling superior pole depression and some fullness lateral chest wall Right chest, all areas healed, redundancy of skin flap folds area of prior wound tethered to chest wall Assessment:     Left breast ca UOQ ER+ ([place]), Left breast ca UIQ ER+ (ILC) S/p bilateral NSM, TE/ADM (Alloderm) reconstruction S/p removal right TE S/p replacement right TE/ADM S/p removal TE, placement saline implants    Plan:     Plan right latissimus flap with replacement TE.Reviewed incisions, hospital stay,  drains, time off work. Reviewed additional surgery for implant placement. At that time would review fat grafting and any revisions to left reconstruction.  Discussed seroma as most common complication LD flap. Additional risks including but not limited to bleeding, hematoma, infection, wound healing problems, damage to deeper structures, asymmetry, unacceptable cosmetic result, failure flap,DVT/PE, cardiopulmonary complications reviewed.   Natrelle Smooth Round Moderate Profile 510 ml saline implants placed bilateral. REF [id number]   LEFT fill volume 530 ml   [name], MD [facility] Plastic & Reconstructive Surgery [phone], pin [phone]

## 2018-10-27 ENCOUNTER — Encounter (HOSPITAL_COMMUNITY)
Admission: RE | Admit: 2018-10-27 | Discharge: 2018-10-27 | Disposition: A | Discharge status: Home / Self Care | Payer: 59 | Source: Ambulatory Visit | Attending: Plastic Surgery | Admitting: Plastic Surgery

## 2018-10-27 ENCOUNTER — Encounter (HOSPITAL_COMMUNITY): Payer: Self-pay

## 2018-10-27 ENCOUNTER — Other Ambulatory Visit: Payer: Self-pay

## 2018-10-27 DIAGNOSIS — Z01812 Encounter for preprocedural laboratory examination: Secondary | ICD-10-CM | POA: Insufficient documentation

## 2018-10-27 HISTORY — DX: Prediabetes: R73.03

## 2018-10-27 LAB — CBC WITH DIFFERENTIAL/PLATELET
Abs Immature Granulocytes: 0.02 10*3/uL (ref 0.00–0.07)
BASOS PCT: 1 %
Basophils Absolute: 0 10*3/uL (ref 0.0–0.1)
EOS ABS: 0.1 10*3/uL (ref 0.0–0.5)
EOS PCT: 1 %
HEMATOCRIT: 42.4 % (ref 36.0–46.0)
Hemoglobin: 13.8 g/dL (ref 12.0–15.0)
IMMATURE GRANULOCYTES: 0 %
LYMPHS ABS: 2.2 10*3/uL (ref 0.7–4.0)
Lymphocytes Relative: 28 %
MCH: 29.1 pg (ref 26.0–34.0)
MCHC: 32.5 g/dL (ref 30.0–36.0)
MCV: 89.3 fL (ref 80.0–100.0)
MONO ABS: 0.7 10*3/uL (ref 0.1–1.0)
MONOS PCT: 9 %
NEUTROS PCT: 61 %
Neutro Abs: 4.7 10*3/uL (ref 1.7–7.7)
Platelets: 288 10*3/uL (ref 150–400)
RBC: 4.75 MIL/uL (ref 3.87–5.11)
RDW: 12.7 % (ref 11.5–15.5)
WBC: 7.7 10*3/uL (ref 4.0–10.5)
nRBC: 0 % (ref 0.0–0.2)

## 2018-10-27 LAB — HEMOGLOBIN A1C
Hgb A1c MFr Bld: 5.4 % (ref 4.8–5.6)
Mean Plasma Glucose: 108.28 mg/dL

## 2018-10-27 LAB — BASIC METABOLIC PANEL
Anion gap: 11 (ref 5–15)
BUN: 13 mg/dL (ref 8–23)
CALCIUM: 9.5 mg/dL (ref 8.9–10.3)
CO2: 28 mmol/L (ref 22–32)
CREATININE: 0.79 mg/dL (ref 0.44–1.00)
Chloride: 101 mmol/L (ref 98–111)
GFR calc Af Amer: >60 mL/min (ref >60)
GLUCOSE: 80 mg/dL (ref 70–99)
Potassium: 3 mmol/L — ABNORMAL LOW (ref 3.5–5.1)
Sodium: 140 mmol/L (ref 135–145)

## 2018-10-27 NOTE — Pre-Procedure Instructions (Addendum)
Misty Lynch  10/27/2018     Your procedure is scheduled on Monday, January 6.  Report to [facility] Admitting at 5:30  A.M.                Your surgery or procedure is scheduled for 7:30 AM                 Call this number if you have problems the morning of surgery: [phone]  This is the number for the Pre- Surgical Desk.     Remember:  Do not eat or drink after midnight Sunday, January 5,   You may drink clear liquids until 4:15  AM.   Clear liquids allowed are:    Water, Juice (non-citric and without pulp), Carbonated beverages, Clear Tea, Black Coffee only, Plain Jell-O only, Gatorade and Plain Popsicles only    Take these medicines the morning of surgery with A SIP OF WATER: Amlodipine Anastrozole Follow your surgeons instructions on Aspirin 1 Week prior to surgery STOP taking, Aspirin Products (Goody Powder Excedrin Migraine), Ibuprofen (Advil), Naproxen (Aleve), Vitamins and Herbal Products (ie Fish Oil)  Special instructions:   [place]- Preparing For Surgery  Before surgery, you can play an important role. Because skin is not sterile, your skin needs to be as free of germs as possible. You can reduce the number of germs on your skin by washing with CHG (chlorahexidine gluconate) Soap before surgery.  CHG is an antiseptic cleaner which kills germs and bonds with the skin to continue killing germs even after washing.    Oral Hygiene is also important to reduce your risk of infection.  Remember - BRUSH YOUR TEETH THE MORNING OF SURGERY WITH YOUR REGULAR TOOTHPASTE  Please do not use if you have an allergy to CHG or antibacterial soaps. If your skin becomes reddened/irritated stop using the CHG.  Do not shave (including legs and underarms) for at least 48 hours prior to first CHG shower. It is OK to shave your face.  Please follow these instructions carefully.   1. Shower the NIGHT BEFORE SURGERY and the MORNING OF SURGERY with CHG.   2. If you chose  to wash your hair, wash your hair first as usual with your normal shampoo.  3. After you shampoo, wash your face and private area with the soap you use at home, then rinse your hair and body thoroughly to remove the shampoo and soap.  4. Use CHG as you would any other liquid soap. You can apply CHG directly to the skin and wash gently with a scrungie or a clean washcloth.   5. Apply the CHG Soap to your body ONLY FROM THE NECK DOWN.  Do not use on open wounds or open sores. Avoid contact with your eyes, ears, mouth and genitals (private parts). Wash Face and genitals (private parts)  with your normal soap.  6. Wash thoroughly, paying special attention to the area where your surgery will be performed.  7. Thoroughly rinse your body with warm water from the neck down.  8. DO NOT shower/wash with your normal soap after using and rinsing off the CHG Soap.  9. Pat yourself dry with a CLEAN TOWEL.  10. Wear CLEAN PAJAMAS to bed the night before surgery, wear comfortable clothes the morning of surgery  11. Place CLEAN SHEETS on your bed the night of your first shower and DO NOT SLEEP WITH PETS.  Day of Surgery: Shower as Instructed above  Do not apply any deodorants/lotions.  Please wear clean clothes to the hospital/surgery center.   Remember to brush your teeth WITH YOUR REGULAR TOOTHPASTE.   1 Week prior to surgery STOP taking Aspirin Products (Goody Powder, Excedrin Migraine), Ibuprofen (Advil), Naproxen (Aleve), Vitamins and Herbal Products (ie Fish Oil)    Do not wear jewelry, make-up or nail polish.  Do not wear lotions, powders, or perfumes, or deodorant.  Do not shave 48 hours prior to surgery.  Men may shave face and neck.  Do not bring valuables to the hospital.  [facility] is not responsible for any belongings or valuables.  Contacts, dentures or bridgework may not be worn into surgery.  Leave your suitcase in the car.  After surgery it may be brought to your room.  For  patients admitted to the hospital, discharge time will be determined by your treatment team.  Please read over the following fact sheets that you were given.  PAIN MANAGEMENT, COUGHING AND DEEP BREATHING, SURGICAL SITE INFECTIONS

## 2018-10-27 NOTE — Progress Notes (Signed)
Mrs. Misty Lynch reports that Dr  Sabra Heck told her that she is  Pre-Diabetic, she does not take medication or check CBGs. Mrs Misty Lynch c/o of having small sores on her bottom lip, wonders if it will cause any problems. I note that lip is red and has small white bumps, patient reports that it only hurts when she brushes teeth. Patient states they have been present for about a month. Patient is keeping area moist.  I notified Karoline Caldwell, PA-C, he said to have patient show anesthesiologist if they are still present the day of surgery.

## 2018-10-27 NOTE — Progress Notes (Addendum)
PCP - Dr. Kathyrn Lass - [place]- I requested last office note , Labs any cardiac studies.  Cardiologist - None Chest x-ray - N/A  EKG - DONE-   Stress Test - NONE  ECHO - NONE  Cardiac Cath - NONE  AICD-NONE PM- LOOP-NONE  Sleep Study - NONE CPAP - NONE  LABS-CBC, BMP  ASA-  HA1C- patient not aware, will do one today Checks Blood Sugar ___0__ times a day  Anesthesia- REVIEW EKG  Pt denies having chest pain, sob, or fever at this time. All instructions explained to the pt, with a verbal understanding of the material. Pt agrees to go over the instructions while at home for a better understanding. The opportunity to ask questions was provided.

## 2018-11-02 NOTE — Anesthesia Preprocedure Evaluation (Addendum)
Anesthesia Evaluation  Patient identified by MRN, date of birth, ID band Patient awake    Reviewed: Allergy & Precautions, NPO status , Patient's Chart, lab work & pertinent test results  Airway Mallampati: I       Dental no notable dental hx. (+) Teeth Intact   Pulmonary neg pulmonary ROS,    Pulmonary exam normal breath sounds clear to auscultation       Cardiovascular hypertension, Pt. on medications Normal cardiovascular exam Rhythm:Regular Rate:Normal     Neuro/Psych negative neurological ROS  negative psych ROS   GI/Hepatic Neg liver ROS, GERD  ,  Endo/Other  negative endocrine ROS  Renal/GU negative Renal ROS  negative genitourinary   Musculoskeletal negative musculoskeletal ROS (+)   Abdominal (+) + obese,   Peds negative pediatric ROS (+)  Hematology negative hematology ROS (+)   Anesthesia Other Findings   Reproductive/Obstetrics negative OB ROS                            Anesthesia Physical  Anesthesia Plan  ASA: II  Anesthesia Plan: General   Post-op Pain Management:    Induction: Intravenous  PONV Risk Score and Plan: 3 and 4 or greater and Ondansetron, Dexamethasone and Treatment may vary due to age or medical condition  Airway Management Planned: Oral ETT  Additional Equipment:   Intra-op Plan:   Post-operative Plan: Extubation in OR  Informed Consent: I have reviewed the patients History and Physical, chart, labs and discussed the procedure including the risks, benefits and alternatives for the proposed anesthesia with the patient or authorized representative who has indicated his/her understanding and acceptance.   Dental advisory given  Plan Discussed with: CRNA  Anesthesia Plan Comments:        Anesthesia Quick Evaluation

## 2018-11-03 ENCOUNTER — Ambulatory Visit (HOSPITAL_COMMUNITY): Payer: 59 | Admitting: Anesthesiology

## 2018-11-03 ENCOUNTER — Ambulatory Visit (HOSPITAL_COMMUNITY): Payer: 59 | Admitting: Physician Assistant

## 2018-11-03 ENCOUNTER — Other Ambulatory Visit: Payer: Self-pay

## 2018-11-03 ENCOUNTER — Encounter (HOSPITAL_COMMUNITY)
Admission: RE | Disposition: A | Discharge status: Home / Self Care | Payer: Self-pay | Source: Home / Self Care | Attending: Plastic Surgery

## 2018-11-03 ENCOUNTER — Observation Stay (HOSPITAL_COMMUNITY)
Admission: RE | Admit: 2018-11-03 | Discharge: 2018-11-04 | Disposition: A | Discharge status: Home / Self Care | Payer: 59 | Attending: Plastic Surgery | Admitting: Plastic Surgery

## 2018-11-03 ENCOUNTER — Encounter (HOSPITAL_COMMUNITY): Payer: Self-pay | Admitting: *Deleted

## 2018-11-03 DIAGNOSIS — Z9012 Acquired absence of left breast and nipple: Secondary | ICD-10-CM | POA: Diagnosis not present

## 2018-11-03 DIAGNOSIS — I10 Essential (primary) hypertension: Secondary | ICD-10-CM | POA: Diagnosis not present

## 2018-11-03 DIAGNOSIS — Z170 Estrogen receptor positive status [ER+]: Secondary | ICD-10-CM | POA: Insufficient documentation

## 2018-11-03 DIAGNOSIS — C50212 Malignant neoplasm of upper-inner quadrant of left female breast: Secondary | ICD-10-CM | POA: Insufficient documentation

## 2018-11-03 DIAGNOSIS — Z421 Encounter for breast reconstruction following mastectomy: Secondary | ICD-10-CM | POA: Diagnosis present

## 2018-11-03 DIAGNOSIS — Z79899 Other long term (current) drug therapy: Secondary | ICD-10-CM | POA: Insufficient documentation

## 2018-11-03 DIAGNOSIS — C50412 Malignant neoplasm of upper-outer quadrant of left female breast: Secondary | ICD-10-CM | POA: Insufficient documentation

## 2018-11-03 DIAGNOSIS — Z853 Personal history of malignant neoplasm of breast: Secondary | ICD-10-CM

## 2018-11-03 DIAGNOSIS — Z79811 Long term (current) use of aromatase inhibitors: Secondary | ICD-10-CM | POA: Insufficient documentation

## 2018-11-03 HISTORY — PX: LATISSIMUS FLAP TO BREAST: SHX5357

## 2018-11-03 HISTORY — PX: BREAST RECONSTRUCTION WITH PLACEMENT OF TISSUE EXPANDER AND ALLODERM: SHX6805

## 2018-11-03 LAB — GLUCOSE, CAPILLARY
GLUCOSE-CAPILLARY: 196 mg/dL — AB (ref 70–99)
Glucose-Capillary: 99 mg/dL (ref 70–99)
Glucose-Capillary: 137 mg/dL — ABNORMAL HIGH (ref 70–99)
Glucose-Capillary: 149 mg/dL — ABNORMAL HIGH (ref 70–99)

## 2018-11-03 SURGERY — BREAST RECONSTRUCTION WITH PLACEMENT OF TISSUE EXPANDER AND ALLODERM
Anesthesia: General | Site: Breast | Laterality: Right

## 2018-11-03 MED ORDER — PROPOFOL 10 MG/ML IV BOLUS
INTRAVENOUS | Status: AC
  Filled 2018-11-03: qty 20

## 2018-11-03 MED ORDER — ONDANSETRON HCL 4 MG/2ML IJ SOLN
INTRAMUSCULAR | Status: DC | PRN
  Administered 2018-11-03: 4 mg via INTRAVENOUS

## 2018-11-03 MED ORDER — MEPERIDINE HCL 50 MG/ML IJ SOLN: 6.2500 mg | INTRAMUSCULAR | Status: DC | PRN

## 2018-11-03 MED ORDER — SODIUM CHLORIDE 0.9 % IV SOLN
INTRAVENOUS | Status: DC | PRN
  Administered 2018-11-03: 25 ug/min via INTRAVENOUS

## 2018-11-03 MED ORDER — 0.9 % SODIUM CHLORIDE (POUR BTL) OPTIME
TOPICAL | Status: DC | PRN
  Administered 2018-11-03 (×2): 1000 mL

## 2018-11-03 MED ORDER — HYDROCHLOROTHIAZIDE 25 MG PO TABS
25.0000 mg | ORAL_TABLET | Freq: Every day | ORAL | Status: DC
  Administered 2018-11-03 – 2018-11-04 (×2): 25 mg via ORAL
  Filled 2018-11-03 (×2): qty 1

## 2018-11-03 MED ORDER — SODIUM CHLORIDE 0.9 % IV SOLN
Freq: Once | INTRAVENOUS | Status: AC
  Administered 2018-11-03: 08:00:00
  Filled 2018-11-03: qty 1

## 2018-11-03 MED ORDER — FENTANYL CITRATE (PF) 250 MCG/5ML IJ SOLN
INTRAMUSCULAR | Status: DC | PRN
  Administered 2018-11-03: 50 ug via INTRAVENOUS
  Administered 2018-11-03: 150 ug via INTRAVENOUS
  Administered 2018-11-03 (×2): 25 ug via INTRAVENOUS

## 2018-11-03 MED ORDER — CELECOXIB 200 MG PO CAPS
ORAL_CAPSULE | ORAL | Status: AC
  Administered 2018-11-03: 200 mg via ORAL
  Filled 2018-11-03: qty 1

## 2018-11-03 MED ORDER — GABAPENTIN 300 MG PO CAPS
300.0000 mg | ORAL_CAPSULE | ORAL | Status: AC
  Administered 2018-11-03: 300 mg via ORAL

## 2018-11-03 MED ORDER — AMLODIPINE BESYLATE 5 MG PO TABS
5.0000 mg | ORAL_TABLET | Freq: Every day | ORAL | Status: DC
  Administered 2018-11-03 – 2018-11-04 (×2): 5 mg via ORAL
  Filled 2018-11-03 (×2): qty 1

## 2018-11-03 MED ORDER — BUPIVACAINE LIPOSOME 1.3 % IJ SUSP
20.0000 mL | INTRAMUSCULAR | Status: AC
  Administered 2018-11-03: 20 mL
  Filled 2018-11-03: qty 20

## 2018-11-03 MED ORDER — CELECOXIB 200 MG PO CAPS
200.0000 mg | ORAL_CAPSULE | ORAL | Status: AC
  Administered 2018-11-03: 200 mg via ORAL

## 2018-11-03 MED ORDER — HYDROCODONE-ACETAMINOPHEN 5-325 MG PO TABS
1.0000 | ORAL_TABLET | ORAL | Status: DC | PRN
  Administered 2018-11-03 – 2018-11-04 (×2): 2 via ORAL
  Filled 2018-11-03 (×3): qty 2

## 2018-11-03 MED ORDER — CHLORHEXIDINE GLUCONATE CLOTH 2 % EX PADS: 6.0000 | MEDICATED_PAD | Freq: Once | CUTANEOUS | Status: DC

## 2018-11-03 MED ORDER — ACETAMINOPHEN 500 MG PO TABS
ORAL_TABLET | ORAL | Status: AC
  Administered 2018-11-03: 1000 mg via ORAL
  Filled 2018-11-03: qty 2

## 2018-11-03 MED ORDER — METHOCARBAMOL 500 MG PO TABS
500.0000 mg | ORAL_TABLET | Freq: Four times a day (QID) | ORAL | Status: DC | PRN
  Administered 2018-11-03: 500 mg via ORAL
  Filled 2018-11-03 (×2): qty 1

## 2018-11-03 MED ORDER — PROPOFOL 10 MG/ML IV BOLUS
INTRAVENOUS | Status: DC | PRN
  Administered 2018-11-03: 170 mg via INTRAVENOUS

## 2018-11-03 MED ORDER — PROMETHAZINE HCL 25 MG/ML IJ SOLN: 6.2500 mg | INTRAMUSCULAR | Status: DC | PRN

## 2018-11-03 MED ORDER — CEFAZOLIN SODIUM-DEXTROSE 1-4 GM/50ML-% IV SOLN
1.0000 g | Freq: Three times a day (TID) | INTRAVENOUS | Status: DC
  Administered 2018-11-03 – 2018-11-04 (×2): 1 g via INTRAVENOUS
  Filled 2018-11-03 (×5): qty 50

## 2018-11-03 MED ORDER — HEPARIN SODIUM (PORCINE) 5000 UNIT/ML IJ SOLN
INTRAMUSCULAR | Status: AC
  Administered 2018-11-03: 5000 [IU] via SUBCUTANEOUS
  Filled 2018-11-03: qty 1

## 2018-11-03 MED ORDER — KETOROLAC TROMETHAMINE 30 MG/ML IJ SOLN: 30.0000 mg | Freq: Once | INTRAMUSCULAR | Status: DC | PRN

## 2018-11-03 MED ORDER — CEFAZOLIN SODIUM-DEXTROSE 2-4 GM/100ML-% IV SOLN
INTRAVENOUS | Status: AC
  Filled 2018-11-03: qty 100

## 2018-11-03 MED ORDER — ONDANSETRON HCL 4 MG/2ML IJ SOLN: 4.0000 mg | Freq: Four times a day (QID) | INTRAMUSCULAR | Status: DC | PRN

## 2018-11-03 MED ORDER — GABAPENTIN 300 MG PO CAPS
ORAL_CAPSULE | ORAL | Status: AC
  Administered 2018-11-03: 300 mg via ORAL
  Filled 2018-11-03: qty 1

## 2018-11-03 MED ORDER — HYDROMORPHONE HCL 1 MG/ML IJ SOLN: 0.5000 mg | INTRAMUSCULAR | Status: DC | PRN

## 2018-11-03 MED ORDER — SUGAMMADEX SODIUM 200 MG/2ML IV SOLN
INTRAVENOUS | Status: DC | PRN
  Administered 2018-11-03: 190 mg via INTRAVENOUS

## 2018-11-03 MED ORDER — ONDANSETRON 4 MG PO TBDP: 4.0000 mg | ORAL_TABLET | Freq: Four times a day (QID) | ORAL | Status: DC | PRN

## 2018-11-03 MED ORDER — CEFAZOLIN SODIUM-DEXTROSE 2-4 GM/100ML-% IV SOLN
2.0000 g | INTRAVENOUS | Status: AC
  Administered 2018-11-03: 2 g via INTRAVENOUS

## 2018-11-03 MED ORDER — ROCURONIUM BROMIDE 50 MG/5ML IV SOSY
PREFILLED_SYRINGE | INTRAVENOUS | Status: AC
  Filled 2018-11-03: qty 5

## 2018-11-03 MED ORDER — LOSARTAN POTASSIUM-HCTZ 100-25 MG PO TABS: 1.0000 | ORAL_TABLET | Freq: Every day | ORAL | Status: DC

## 2018-11-03 MED ORDER — EPHEDRINE SULFATE 50 MG/ML IJ SOLN
INTRAMUSCULAR | Status: DC | PRN
  Administered 2018-11-03 (×4): 5 mg via INTRAVENOUS

## 2018-11-03 MED ORDER — SODIUM CHLORIDE (PF) 0.9 % IJ SOLN
INTRAMUSCULAR | Status: DC | PRN
  Administered 2018-11-03: 20 mL

## 2018-11-03 MED ORDER — GABAPENTIN 300 MG PO CAPS
300.0000 mg | ORAL_CAPSULE | Freq: Two times a day (BID) | ORAL | Status: DC
  Administered 2018-11-03 – 2018-11-04 (×2): 300 mg via ORAL
  Filled 2018-11-03 (×2): qty 1

## 2018-11-03 MED ORDER — DEXAMETHASONE SODIUM PHOSPHATE 10 MG/ML IJ SOLN
INTRAMUSCULAR | Status: DC | PRN
  Administered 2018-11-03: 10 mg via INTRAVENOUS

## 2018-11-03 MED ORDER — LIDOCAINE 2% (20 MG/ML) 5 ML SYRINGE
INTRAMUSCULAR | Status: DC | PRN
  Administered 2018-11-03: 100 mg via INTRAVENOUS

## 2018-11-03 MED ORDER — LIDOCAINE 2% (20 MG/ML) 5 ML SYRINGE
INTRAMUSCULAR | Status: AC
  Filled 2018-11-03: qty 5

## 2018-11-03 MED ORDER — ONDANSETRON HCL 4 MG/2ML IJ SOLN
INTRAMUSCULAR | Status: AC
  Filled 2018-11-03: qty 2

## 2018-11-03 MED ORDER — DEXAMETHASONE SODIUM PHOSPHATE 10 MG/ML IJ SOLN
INTRAMUSCULAR | Status: AC
  Filled 2018-11-03: qty 1

## 2018-11-03 MED ORDER — ACETAMINOPHEN 500 MG PO TABS
1000.0000 mg | ORAL_TABLET | ORAL | Status: AC
  Administered 2018-11-03: 1000 mg via ORAL

## 2018-11-03 MED ORDER — HYDROMORPHONE HCL 1 MG/ML IJ SOLN: 0.2500 mg | INTRAMUSCULAR | Status: DC | PRN

## 2018-11-03 MED ORDER — LACTATED RINGERS IV SOLN
INTRAVENOUS | Status: DC | PRN
  Administered 2018-11-03 (×2): via INTRAVENOUS

## 2018-11-03 MED ORDER — FENTANYL CITRATE (PF) 250 MCG/5ML IJ SOLN
INTRAMUSCULAR | Status: AC
  Filled 2018-11-03: qty 5

## 2018-11-03 MED ORDER — MIDAZOLAM HCL 2 MG/2ML IJ SOLN
INTRAMUSCULAR | Status: AC
  Filled 2018-11-03: qty 2

## 2018-11-03 MED ORDER — MIDAZOLAM HCL 2 MG/2ML IJ SOLN
INTRAMUSCULAR | Status: DC | PRN
  Administered 2018-11-03: 2 mg via INTRAVENOUS

## 2018-11-03 MED ORDER — LOSARTAN POTASSIUM 50 MG PO TABS
100.0000 mg | ORAL_TABLET | Freq: Every day | ORAL | Status: DC
  Administered 2018-11-03 – 2018-11-04 (×2): 100 mg via ORAL
  Filled 2018-11-03 (×2): qty 2

## 2018-11-03 MED ORDER — ENOXAPARIN SODIUM 40 MG/0.4ML ~~LOC~~ SOLN
40.0000 mg | SUBCUTANEOUS | Status: DC
  Administered 2018-11-04: 40 mg via SUBCUTANEOUS
  Filled 2018-11-03: qty 0.4

## 2018-11-03 MED ORDER — HEPARIN SODIUM (PORCINE) 5000 UNIT/ML IJ SOLN
5000.0000 [IU] | Freq: Once | INTRAMUSCULAR | Status: AC
  Administered 2018-11-03: 5000 [IU] via SUBCUTANEOUS

## 2018-11-03 MED ORDER — KCL IN DEXTROSE-NACL 20-5-0.45 MEQ/L-%-% IV SOLN
INTRAVENOUS | Status: DC
  Administered 2018-11-03: 17:00:00 via INTRAVENOUS
  Filled 2018-11-03: qty 1000

## 2018-11-03 MED ORDER — ROCURONIUM BROMIDE 10 MG/ML (PF) SYRINGE
PREFILLED_SYRINGE | INTRAVENOUS | Status: DC | PRN
  Administered 2018-11-03 (×3): 20 mg via INTRAVENOUS
  Administered 2018-11-03: 40 mg via INTRAVENOUS

## 2018-11-03 MED ORDER — KETOROLAC TROMETHAMINE 30 MG/ML IJ SOLN
15.0000 mg | Freq: Three times a day (TID) | INTRAMUSCULAR | Status: DC
  Administered 2018-11-03 – 2018-11-04 (×2): 15 mg via INTRAVENOUS
  Filled 2018-11-03 (×2): qty 1

## 2018-11-03 SURGICAL SUPPLY — 103 items
ADH SKN CLS APL DERMABOND .7 (GAUZE/BANDAGES/DRESSINGS) ×2
APPLIER CLIP 9.375 MED OPEN (MISCELLANEOUS) ×3
APR CLP MED 9.3 20 MLT OPN (MISCELLANEOUS) ×1
BAG DECANTER FOR FLEXI CONT (MISCELLANEOUS) ×3 IMPLANT
BINDER BREAST LRG (GAUZE/BANDAGES/DRESSINGS) IMPLANT
BINDER BREAST XLRG (GAUZE/BANDAGES/DRESSINGS) ×2 IMPLANT
BLADE SURG 10 STRL SS (BLADE) ×3 IMPLANT
BLADE SURG 15 STRL LF DISP TIS (BLADE) ×1 IMPLANT
BLADE SURG 15 STRL SS (BLADE)
BNDG COHESIVE 4X5 TAN STRL (GAUZE/BANDAGES/DRESSINGS) ×2 IMPLANT
CANISTER SUCT 3000ML PPV (MISCELLANEOUS) ×3 IMPLANT
CHLORAPREP W/TINT 26ML (MISCELLANEOUS) ×5 IMPLANT
CLIP APPLIE 9.375 MED OPEN (MISCELLANEOUS) ×1 IMPLANT
CLOSURE WOUND 1/2 X4 (GAUZE/BANDAGES/DRESSINGS) ×2
COVER MAYO STAND STRL (DRAPES) ×4 IMPLANT
COVER SURGICAL LIGHT HANDLE (MISCELLANEOUS) ×5 IMPLANT
COVER WAND RF STERILE (DRAPES) ×1 IMPLANT
DERMABOND ADVANCED (GAUZE/BANDAGES/DRESSINGS) ×4
DERMABOND ADVANCED .7 DNX12 (GAUZE/BANDAGES/DRESSINGS) ×2 IMPLANT
DRAIN CHANNEL 15F RND FF W/TCR (WOUND CARE) ×2 IMPLANT
DRAIN CHANNEL 19F RND (DRAIN) ×2 IMPLANT
DRAPE HALF SHEET 40X57 (DRAPES) ×10 IMPLANT
DRAPE INCISE 23X17 IOBAN STRL (DRAPES)
DRAPE INCISE 23X17 STRL (DRAPES) IMPLANT
DRAPE INCISE IOBAN 23X17 STRL (DRAPES) IMPLANT
DRAPE INCISE IOBAN 66X45 STRL (DRAPES) ×2 IMPLANT
DRAPE INCISE IOBAN 85X60 (DRAPES) ×1 IMPLANT
DRAPE ORTHO SPLIT 77X108 STRL (DRAPES) ×12
DRAPE SURG ORHT 6 SPLT 77X108 (DRAPES) ×4 IMPLANT
DRAPE UTILITY XL STRL (DRAPES) ×2 IMPLANT
DRAPE WARM FLUID 44X44 (DRAPE) ×1 IMPLANT
DRSG MEPILEX BORDER 4X8 (GAUZE/BANDAGES/DRESSINGS) ×1 IMPLANT
DRSG PAD ABDOMINAL 8X10 ST (GAUZE/BANDAGES/DRESSINGS) ×6 IMPLANT
DRSG TEGADERM 4X4.75 (GAUZE/BANDAGES/DRESSINGS) ×12 IMPLANT
ELECT BLADE 4.0 EZ CLEAN MEGAD (MISCELLANEOUS) ×3
ELECT BLADE 6.5 EXT (BLADE) ×1 IMPLANT
ELECT CAUTERY BLADE 6.4 (BLADE) ×3 IMPLANT
ELECT COATED BLADE 2.86 ST (ELECTRODE) ×5 IMPLANT
ELECT REM PT RETURN 9FT ADLT (ELECTROSURGICAL) ×3
ELECTRODE BLDE 4.0 EZ CLN MEGD (MISCELLANEOUS) ×1 IMPLANT
ELECTRODE REM PT RTRN 9FT ADLT (ELECTROSURGICAL) ×1 IMPLANT
EVACUATOR SILICONE 100CC (DRAIN) ×6 IMPLANT
EXPANDER TISSUE MX FOURTE 400 (Prosthesis & Implant Plastic) IMPLANT
GAUZE SPONGE 4X4 12PLY STRL LF (GAUZE/BANDAGES/DRESSINGS) ×2 IMPLANT
GAUZE XEROFORM 5X9 LF (GAUZE/BANDAGES/DRESSINGS) ×1 IMPLANT
GLOVE BIO SURGEON STRL SZ 6 (GLOVE) ×13 IMPLANT
GLOVE BIOGEL PI IND STRL 6.5 (GLOVE) IMPLANT
GLOVE BIOGEL PI INDICATOR 6.5 (GLOVE) ×2
GOWN STRL REUS W/ TWL LRG LVL3 (GOWN DISPOSABLE) ×2 IMPLANT
GOWN STRL REUS W/TWL LRG LVL3 (GOWN DISPOSABLE) ×9
IV NS 500ML (IV SOLUTION) ×3
IV NS 500ML BAXH (IV SOLUTION) IMPLANT
KIT BASIN OR (CUSTOM PROCEDURE TRAY) ×3 IMPLANT
KIT TURNOVER KIT B (KITS) ×3 IMPLANT
MARKER SKIN DUAL TIP RULER LAB (MISCELLANEOUS) ×3 IMPLANT
NDL HYPO 25GX1X1/2 BEV (NEEDLE) IMPLANT
NEEDLE 22X1 1/2 (OR ONLY) (NEEDLE) ×3 IMPLANT
NEEDLE HYPO 25GX1X1/2 BEV (NEEDLE) ×3 IMPLANT
NS IRRIG 1000ML POUR BTL (IV SOLUTION) ×6 IMPLANT
PACK GENERAL/GYN (CUSTOM PROCEDURE TRAY) ×3 IMPLANT
PAD ABD 8X10 STRL (GAUZE/BANDAGES/DRESSINGS) ×4 IMPLANT
PAD ARMBOARD 7.5X6 YLW CONV (MISCELLANEOUS) ×9 IMPLANT
PEN SKIN MARKING BROAD (MISCELLANEOUS) ×3 IMPLANT
PENCIL BUTTON HOLSTER BLD 10FT (ELECTRODE) ×2 IMPLANT
PIN SAFETY STERILE (MISCELLANEOUS) ×3 IMPLANT
PUNCH BIOPSY 4MM (MISCELLANEOUS)
PUNCH BIOPSY DERMAL 6MM STRL (MISCELLANEOUS) IMPLANT
PUNCH BIOPSY DISP 4 (MISCELLANEOUS) IMPLANT
SET ASEPTIC TRANSFER (MISCELLANEOUS) ×3 IMPLANT
SET COLLECT BLD 21X3/4 12 PB (MISCELLANEOUS) IMPLANT
SOL PREP POV-IOD 4OZ 10% (MISCELLANEOUS) ×1 IMPLANT
SPONGE LAP 18X18 RF (DISPOSABLE) ×8 IMPLANT
SPONGE LAP 18X18 X RAY DECT (DISPOSABLE) IMPLANT
STAPLER VISISTAT 35W (STAPLE) ×3 IMPLANT
STOCKINETTE IMPERVIOUS 9X36 MD (GAUZE/BANDAGES/DRESSINGS) ×2 IMPLANT
STRIP CLOSURE SKIN 1/2X4 (GAUZE/BANDAGES/DRESSINGS) ×4 IMPLANT
SUT CHROMIC 4 0 PS 2 18 (SUTURE) IMPLANT
SUT ETHILON 2 0 FS 18 (SUTURE) ×10 IMPLANT
SUT MNCRL AB 4-0 PS2 18 (SUTURE) ×6 IMPLANT
SUT PDS AB 2-0 CT1 27 (SUTURE) ×2 IMPLANT
SUT PDS AB 2-0 CT2 27 (SUTURE) ×10 IMPLANT
SUT VIC AB 0 CT2 27 (SUTURE) IMPLANT
SUT VIC AB 3-0 PS2 18 (SUTURE)
SUT VIC AB 3-0 PS2 18XBRD (SUTURE) ×4 IMPLANT
SUT VIC AB 3-0 SH 27 (SUTURE) ×3
SUT VIC AB 3-0 SH 27X BRD (SUTURE) IMPLANT
SUT VIC AB 3-0 SH 8-18 (SUTURE) ×1 IMPLANT
SUT VIC AB 4-0 PS2 18 (SUTURE) ×2 IMPLANT
SUT VICRYL 4-0 PS2 18IN ABS (SUTURE) IMPLANT
SUT VLOC 180 0 24IN GS25 (SUTURE) ×4 IMPLANT
SYR 50ML SLIP (SYRINGE) IMPLANT
SYR BULB IRRIGATION 50ML (SYRINGE) ×3 IMPLANT
SYR CONTROL 10ML LL (SYRINGE) ×3 IMPLANT
TISSUE EXPNDR MX FOURTE 400 (Prosthesis & Implant Plastic) ×3 IMPLANT
TOWEL OR 17X24 6PK STRL BLUE (TOWEL DISPOSABLE) ×3 IMPLANT
TOWEL OR 17X26 10 PK STRL BLUE (TOWEL DISPOSABLE) ×3 IMPLANT
TRAY FOLEY MTR SLVR 14FR STAT (SET/KITS/TRAYS/PACK) ×1 IMPLANT
TRAY FOLEY MTR SLVR 16FR STAT (SET/KITS/TRAYS/PACK) ×2 IMPLANT
TUBE CONNECTING 12'X1/4 (SUCTIONS) ×1
TUBE CONNECTING 12X1/4 (SUCTIONS) ×2 IMPLANT
TUBE CONNECTING 20'X1/4 (TUBING) ×2
TUBE CONNECTING 20X1/4 (TUBING) ×2 IMPLANT
YANKAUER SUCT BULB TIP NO VENT (SUCTIONS) ×4 IMPLANT

## 2018-11-03 NOTE — Interval H&P Note (Signed)
History and Physical Interval Note:  11/03/2018 6:03 AM  Misty Lynch  has presented today for surgery, with the diagnosis of history breast ca, acquired absence breasts  The various methods of treatment have been discussed with the patient and family. After consideration of risks, benefits and other options for treatment, the patient has consented to  Procedure(s): RIGHT BREAST RECONSTRUCTION WITH TISSUE EXPANDERS (Right) LATISSIMUS FLAP TO BREAST (Right) as a surgical intervention .  The patient's history has been reviewed, patient examined, no change in status, stable for surgery.  I have reviewed the patient's chart and labs.  Questions were answered to the patient's satisfaction.     Arnoldo Hooker Massey [place]

## 2018-11-03 NOTE — Progress Notes (Signed)
Patient arrived to the floor from PACU, alert oriented times 4. No c/o pain at this time. Patient ambulates to go to bathroom and voids. Vital signs within normal range. Patient in bed locked at lower level, bedside table, call light and bedside telephone within patient reach. Patient has two J.P.  drains to her right breast and breast binder and SCD is on at this time. Will continue to monitor patient.

## 2018-11-03 NOTE — Anesthesia Postprocedure Evaluation (Signed)
Anesthesia Post Note  Patient: Misty Lynch  Procedure(s) Performed: RIGHT BREAST RECONSTRUCTION WITH TISSUE EXPANDERS (Right Breast) LATISSIMUS FLAP TO BREAST (Right Breast)     Patient location during evaluation: PACU Anesthesia Type: General Level of consciousness: awake Pain management: pain level controlled Vital Signs Assessment: post-procedure vital signs reviewed and stable Respiratory status: spontaneous breathing Cardiovascular status: stable Postop Assessment: no apparent nausea or vomiting Anesthetic complications: no    Last Vitals:  Vitals:   11/03/18 1234 11/03/18 1249  BP: (!) 149/66 133/62  Pulse: 60 64  Resp: 15 16  Temp:    SpO2: 96% 96%    Last Pain:  Vitals:   11/03/18 1255  PainSc: Asleep   Pain Goal: Patients Stated Pain Goal: 2 (11/03/18 0642)  LLE Motor Response: Purposeful movement (11/03/18 1255) LLE Sensation: Full sensation (11/03/18 1255) RLE Motor Response: Purposeful movement (11/03/18 1255) RLE Sensation: Full sensation (11/03/18 1255)      Huston Foley

## 2018-11-03 NOTE — Transfer of Care (Signed)
Immediate Anesthesia Transfer of Care Note  Patient: Misty Lynch  Procedure(s) Performed: RIGHT BREAST RECONSTRUCTION WITH TISSUE EXPANDERS (Right Breast) LATISSIMUS FLAP TO BREAST (Right Breast)  Patient Location: PACU  Anesthesia Type:General  Level of Consciousness: awake, alert  and oriented  Airway & Oxygen Therapy: Patient Spontanous Breathing and Patient connected to nasal cannula oxygen  Post-op Assessment: Report given to RN and Post -op Vital signs reviewed and stable  Post vital signs: Reviewed and stable  Last Vitals:  Vitals Value Taken Time  BP 136/112 11/03/2018 11:30 AM  Temp 36.5 C 11/03/2018 11:30 AM  Pulse 81 11/03/2018 11:33 AM  Resp 20 11/03/2018 11:33 AM  SpO2 97 % 11/03/2018 11:33 AM  Vitals shown include unvalidated device data.  Last Pain:  Vitals:   11/03/18 0642  PainSc: 0-No pain      Patients Stated Pain Goal: 2 (54/65/68 1275)  Complications: No apparent anesthesia complications

## 2018-11-03 NOTE — Op Note (Signed)
First Assist Op Note: [place] I assisted the Surgeon(s) __Dr. Arnoldo Hooker Th[place]ma[place]__ on the procedure(s): _RIGHT BREAST RECONSTRUCTION WITH TISSUE EXPANDERS (Right), LATISSIMUS FLAP TO BREAST (Right)___on Date _1/03/2019.   I provided my assistance on this case as follows:    I was present and acted as first Environmental consultant during this operation. I was present during the patient transport into the operative suite and assisted the OR staff with transferring and positioning of the patient. All extremities were checked and properly cushioned and safety straps in place. I was involved in the prepping and placement of sterile drapes. A time out was performed and all information confirmed to be correct.  I first assisted during the case including retraction for exposure, assisting with closure of surgical wounds and application of sterile dressings. I provided assistance with application of post operative garments/splinting and assisted with patient transfer back to the stretcher as needed.  Caren Macadam PA-C Plastic Surgery [phone]

## 2018-11-03 NOTE — Op Note (Signed)
Operative Note   DATE OF OPERATION: 1.6.20  LOCATION: [facility]  SURGICAL DIVISION: Plastic Surgery  PREOPERATIVE DIAGNOSES:  1. History breast cancer 2. Acquired absence breasts  POSTOPERATIVE DIAGNOSES:  same  PROCEDURE:  1. Right breast reconstruction with tissue expander 2. Right latissimus dorsi flap for breast reconstruction  SURGEON: [name] MD MBA  ASSISTANT: Paulette Blanch PA-C  ANESTHESIA:  General.   EBL: 938 ml  COMPLICATIONS: None immediate.   INDICATIONS FOR PROCEDURE:  The patient, Misty Lynch, is a 63 y.o. female born on 1956-01-02, is here for right breast reconstruction. She has a history bilateral nipple sparing mastectomies. Her course has been complicated by right nipple areolar necrosis, infection requiring removal expander, subsequent replacement expander and exposure implant and removal. Plan latissimus flap for coverage given this history and attenuated soft tissue mastectomy flap.    FINDINGS: Natrelle 133S-MX-12 T 400 ml tissue expander placed, initial fill volume 80 ml saline. SN [id number]  DESCRIPTION OF PROCEDURE:  The patient was marked with the patient in the preoperative area to mark sternal notch, chest midline, anterior axillary line and inframammary fold. The patient was taken to the operating room. SCDs were placed and IV antibiotics, SQ heparinwere administered. Foley catheter placed. Patient positioned into left lateral position.The patient's operative site was prepped and draped in a sterile fashion. A time out was performed and all information was confirmed to be correct.   Incision made in prior inframammary fold scar. Skin flaps elevated off pectoralis muscle. Two perforations of mastectomy flap over upper outer quadrant incurred due to significant scarring and attenuated soft tissue. The pectoralis muscle was replaced over chest wall with 0 V lock suture. Dissection over pectoralis continued toward right axilla. Elliptical  skin paddle designed right back and incision made around skin paddle.Skin and superficial fascia elevated off surface of latissimus muscle and subcutaneous tunnel dissected to axilla joining anterior breast cavity.Anterior border of latissimus identified and elevated. Muscle divided inferiorly at superior iliac spine. Submuscular dissection completed toward midline back and toward origin. Flap rotated into anterior chest cavity. Back irrigated and hemostasis ensured. 15 Fr drain placed and secured with 2-0 nylon. 2-0 PDS used to placed quilting sutures from elevated skin flaps to chest wall. Exparel infiltrated. Incision closed with 0 V lock suture in superficial fascia and dermis. Skin closure completed with 4-0 monocryl subcuticular and tissue glue applied.   Patient repositioned into supine position. Patient re prepped and draped. Additional time out performed.   Breast ccavity was irrigated with solution containingAncef, gentamicin,and bacitracin. Hemostasis was ensured. Thoracodorsal nerve was divided. The skin paddle overlying latissimus muscle was discarded. The muscle was oriented and sutured to pectoralis muscle and chest wall with 2-0 PDS suture in figure of eight fashion. A 19 Fr drain was placed in breast cavity and secured to skin with 2-0 nylon. Cavity irrigated with Betadine.The tissue expanderwasprepared and placed beneath latissimus muscle. The inferior latissimus was secured to superficial fascia with 2-0 PDS interrupted. Skin closure completedwith 3-0 vicryl in fascial layer and 4-0 vicryl in dermis. The two perforations were closed primarily with 3-0 and 4-0 vicryl in dermis. Skin closure completed with 4-0 monocryl subcuticular throughout and tissue adhesive applied.The expander port was identified and accessed, total 80 ml saline infiltrated.  Transparent, adherent dressings applied.Dry dressing and breast binder applied.  The patient was allowed to wake from anesthesia,  extubated and taken to the recovery room in satisfactory condition.    SPECIMENS: none  DRAINS: 15 Fr JP  in right back, 19 Fr JP in right chest  [name], MD [facility] Plastic & Reconstructive Surgery [phone], pin [phone]

## 2018-11-03 NOTE — Anesthesia Procedure Notes (Addendum)
Procedure Name: Intubation Date/Time: 11/03/2018 7:24 AM Performed by: Bryson Corona, CRNA Pre-anesthesia Checklist: Patient identified, Emergency Drugs available, Suction available and Patient being monitored Patient Re-evaluated:Patient Re-evaluated prior to induction Oxygen Delivery Method: Circle System Utilized Preoxygenation: Pre-oxygenation with 100% oxygen Induction Type: IV induction Ventilation: Mask ventilation without difficulty Laryngoscope Size: Mac and 3 Grade View: Grade II Tube type: Oral Tube size: 7.0 mm Number of attempts: 1 Airway Equipment and Method: Stylet and Oral airway Placement Confirmation: ETT inserted through vocal cords under direct vision,  positive ETCO2 and breath sounds checked- equal and bilateral Secured at: 23 cm Tube secured with: Tape Dental Injury: Teeth and Oropharynx as per pre-operative assessment  Comments: Grade 2a view

## 2018-11-04 ENCOUNTER — Encounter (HOSPITAL_COMMUNITY): Payer: Self-pay | Admitting: Plastic Surgery

## 2018-11-04 DIAGNOSIS — Z421 Encounter for breast reconstruction following mastectomy: Secondary | ICD-10-CM | POA: Diagnosis not present

## 2018-11-04 LAB — GLUCOSE, CAPILLARY
Glucose-Capillary: 99 mg/dL (ref 70–99)
Glucose-Capillary: 89 mg/dL (ref 70–99)

## 2018-11-04 MED ORDER — METHOCARBAMOL 500 MG PO TABS: 500.0000 mg | ORAL_TABLET | Freq: Three times a day (TID) | ORAL | 0 refills | Status: DC | PRN

## 2018-11-04 MED ORDER — SULFAMETHOXAZOLE-TRIMETHOPRIM 800-160 MG PO TABS: 1.0000 | ORAL_TABLET | Freq: Two times a day (BID) | ORAL | 0 refills | Status: DC

## 2018-11-04 MED ORDER — HYDROCODONE-ACETAMINOPHEN 5-325 MG PO TABS: 1.0000 | ORAL_TABLET | ORAL | 0 refills | Status: DC | PRN

## 2018-11-04 NOTE — Progress Notes (Signed)
Discharge instructions reviewed with pt and pt's husband and instructed on where to pick up prescriptions.  Pt and pt's husband verbalized understanding and had no questions.  Pt discharged in stable condition via wheelchair with husband.  Eliezer Bottom [place]

## 2018-11-04 NOTE — Discharge Summary (Signed)
Physician Discharge Summary  Patient ID: Misty Lynch MRN: [id number] DOB/AGE: 1955-12-24 63 y.o.  Admit date: 11/03/2018 Discharge date: 11/04/2018  Admission Diagnoses: history breast cancer, acquired absence breasts  Discharge Diagnoses:  same  Discharged Condition: stable  Hospital Course: Patient did well postoperatively tolerating diet and ambulating, voiding without assist on POD#0. Drain care reviewed.  Treatments: surgery: right breast reconstruction with tissue expander latissimus dorsi flap 1.6.20  Discharge Exam: Blood pressure (!) 104/47, pulse (!) 50, temperature 97.9 F (36.6 C), temperature source Oral, resp. rate 15, SpO2 98 %. Incision/Wound: chest soft with incisions intact Tegaderms in place, back incision intact flap, JPs serosanguinous  Disposition: Discharge disposition: 01-Home or Self Care       Discharge Instructions    Call MD for:  redness, tenderness, or signs of infection (pain, swelling, bleeding, redness, odor or green/yellow discharge around incision site)   Complete by:  As directed    Call MD for:  temperature >100.5   Complete by:  As directed    Discharge instructions   Complete by:  As directed    Ok to remove dressings and shower am 1.8.20.  Soap and water ok, pat Tegaderms dry. Do not remove Tegaderms. No creams or ointments over incisions. Do not let drains dangle in shower, attach to lanyard or similar.Strip and record drains twice daily and bring log to clinic visit.  Breast binder or soft compression bra  all other times.  Ok to raise arms above shoulders for bathing and dressing.  No house yard work or exercise until cleared by MD.   Driving Restrictions   Complete by:  As directed    No driving for 2 weeks then no driving if taking narcotics   Lifting restrictions   Complete by:  As directed    No lifting > 5 lbs until cleared by MD   Resume previous diet   Complete by:  As directed      Allergies as of 11/04/2018    Reactions   Tetracyclines & Related Itching      Medication List    TAKE these medications   amLODipine 5 MG tablet Commonly known as:  NORVASC Take 5 mg by mouth daily.   anastrozole 1 MG tablet Commonly known as:  ARIMIDEX Take 1 tablet (1 mg total) by mouth daily.   aspirin EC 81 MG tablet Take 81 mg by mouth daily.   Calcium-Vitamin D-Vitamin K 500-100-40 MG-UNT-MCG Chew Chew 1 tablet by mouth 2 (two) times daily with a meal.   Cholecalciferol 1000 UNIT/10ML Liqd Commonly known as:  WELLESSE VITAMIN D3 Take 10 mLs by mouth daily.   HYDROcodone-acetaminophen 5-325 MG tablet Commonly known as:  NORCO/VICODIN Take 1-2 tablets by mouth every 4 (four) hours as needed for moderate pain.   losartan-hydrochlorothiazide 100-25 MG tablet Commonly known as:  HYZAAR Take 1 tablet by mouth daily.   methocarbamol 500 MG tablet Commonly known as:  ROBAXIN Take 1 tablet (500 mg total) by mouth every 8 (eight) hours as needed for muscle spasms.   multivitamin-lutein Caps capsule Take 1 capsule by mouth daily.   sulfamethoxazole-trimethoprim 800-160 MG tablet Commonly known as:  BACTRIM DS,SEPTRA DS Take 1 tablet by mouth 2 (two) times daily.      Follow-up Information    [name], MD In 1 week.   Specialty:  Plastic Surgery Contact information: [address] [place]  [id number] [phone]           Signed:  Rowland Ericsson 11/04/2018, 7:19 AM

## 2019-01-02 DIAGNOSIS — R011 Cardiac murmur, unspecified: Secondary | ICD-10-CM | POA: Diagnosis not present

## 2019-01-02 DIAGNOSIS — K5900 Constipation, unspecified: Secondary | ICD-10-CM | POA: Diagnosis not present

## 2019-01-02 DIAGNOSIS — F418 Other specified anxiety disorders: Secondary | ICD-10-CM | POA: Diagnosis not present

## 2019-01-05 ENCOUNTER — Other Ambulatory Visit: Payer: Self-pay | Admitting: Family Medicine

## 2019-01-05 ENCOUNTER — Other Ambulatory Visit (HOSPITAL_COMMUNITY): Payer: Self-pay | Admitting: Family Medicine

## 2019-01-05 DIAGNOSIS — R011 Cardiac murmur, unspecified: Secondary | ICD-10-CM

## 2019-01-27 ENCOUNTER — Telehealth: Payer: Self-pay | Admitting: Cardiovascular Disease

## 2019-01-27 NOTE — Telephone Encounter (Signed)
Spoke with patient ok to cancel echo for 4/6 due to [facility] Virus and reschedule in 3-6 months

## 2019-02-02 ENCOUNTER — Other Ambulatory Visit (HOSPITAL_COMMUNITY): Payer: 59

## 2019-02-17 ENCOUNTER — Telehealth: Payer: Self-pay | Admitting: Cardiovascular Disease

## 2019-02-17 NOTE — Telephone Encounter (Signed)
   Primary Cardiologist:  No primary care provider on file.   Patient contacted.  History reviewed.    Talked to patient. She is not having any cardiac symptoms Primary MD  heard a heart murmur Has had the murmur for years  No DOE , no orthopnea   Priority level 3 .    No symptoms to suggest any unstable cardiac conditions.  Based on discussion, with current pandemic situation, we will be postponing this appointment for Misty Lynch with a plan for f/u in 12  wks or sooner if feasible/necessary.  If symptoms change, she has been instructed to contact our office.   Routing to C19 CANCEL pool for tracking (P CV DIV CV19 CANCEL - reason for visit "other.") and assigning priority ( 3 = >12 wks).   Mertie Moores, MD  02/17/2019 4:58 PM         .

## 2019-02-24 ENCOUNTER — Ambulatory Visit (HOSPITAL_COMMUNITY): Payer: Medicare Other | Attending: Cardiovascular Disease

## 2019-03-12 DIAGNOSIS — Z9013 Acquired absence of bilateral breasts and nipples: Secondary | ICD-10-CM | POA: Diagnosis not present

## 2019-03-12 DIAGNOSIS — N651 Disproportion of reconstructed breast: Secondary | ICD-10-CM | POA: Diagnosis not present

## 2019-03-12 DIAGNOSIS — Z853 Personal history of malignant neoplasm of breast: Secondary | ICD-10-CM | POA: Diagnosis not present

## 2019-03-12 DIAGNOSIS — N650 Deformity of reconstructed breast: Secondary | ICD-10-CM | POA: Diagnosis not present

## 2019-03-12 NOTE — Progress Notes (Signed)
Dr Iran Planas called to see if it would be a problem that pt has not had ECHO done prior to surgery (recommended by cariology and scheduled but canceled d/t COVID with plans to reschedule in 3-6 mos). Last stress test in 2012 reports EF 70%. Reviewed chart with Dr Marcell Barlow who said OK to proceed with surgery.

## 2019-03-30 ENCOUNTER — Encounter (HOSPITAL_BASED_OUTPATIENT_CLINIC_OR_DEPARTMENT_OTHER): Payer: Self-pay | Admitting: *Deleted

## 2019-03-30 ENCOUNTER — Other Ambulatory Visit: Payer: Self-pay

## 2019-03-30 ENCOUNTER — Other Ambulatory Visit (HOSPITAL_COMMUNITY): Payer: Medicare Other

## 2019-03-31 ENCOUNTER — Encounter (HOSPITAL_BASED_OUTPATIENT_CLINIC_OR_DEPARTMENT_OTHER)
Admission: RE | Admit: 2019-03-31 | Discharge: 2019-03-31 | Disposition: A | Discharge status: Home / Self Care | Payer: Medicare Other | Source: Ambulatory Visit | Attending: Plastic Surgery | Admitting: Plastic Surgery

## 2019-03-31 ENCOUNTER — Other Ambulatory Visit (HOSPITAL_COMMUNITY)
Admission: RE | Admit: 2019-03-31 | Discharge: 2019-03-31 | Disposition: A | Discharge status: Home / Self Care | Payer: Medicare Other | Source: Ambulatory Visit | Attending: Plastic Surgery | Admitting: Plastic Surgery

## 2019-03-31 DIAGNOSIS — Z45811 Encounter for adjustment or removal of right breast implant: Secondary | ICD-10-CM | POA: Diagnosis not present

## 2019-03-31 DIAGNOSIS — Z1159 Encounter for screening for other viral diseases: Secondary | ICD-10-CM | POA: Diagnosis not present

## 2019-03-31 DIAGNOSIS — Z853 Personal history of malignant neoplasm of breast: Secondary | ICD-10-CM | POA: Diagnosis not present

## 2019-03-31 DIAGNOSIS — I10 Essential (primary) hypertension: Secondary | ICD-10-CM | POA: Diagnosis not present

## 2019-03-31 LAB — BASIC METABOLIC PANEL
Anion gap: 10 (ref 5–15)
BUN: 11 mg/dL (ref 8–23)
CO2: 29 mmol/L (ref 22–32)
Calcium: 9.8 mg/dL (ref 8.9–10.3)
Chloride: 102 mmol/L (ref 98–111)
Creatinine, Ser: 0.8 mg/dL (ref 0.44–1.00)
GFR calc Af Amer: >60 mL/min (ref >60)
GFR calc non Af Amer: >60 mL/min (ref >60)
Glucose, Bld: 113 mg/dL — ABNORMAL HIGH (ref 70–99)
Potassium: 3.4 mmol/L — ABNORMAL LOW (ref 3.5–5.1)
Sodium: 141 mmol/L (ref 135–145)

## 2019-03-31 NOTE — Progress Notes (Signed)
Patient was given pre-surgery drink and surgical soap with instructions on usage.  Patient verbalized understanding.

## 2019-03-31 NOTE — H&P (Signed)
  Subjective:     Patient ID: Misty Lynch is a 63 y.o. female.  Follow-up   4.5 months post op. Scheduled for implant exchange right chest next month. This was delayed secondary to COVID.  Post op bilateral NSM with TE reconstruction. Developed cellulitis and drainage right chest, and underwent removal ADM and TE on right. Culture MSSA, completed Bactrim. Also experienced nipple necrosis on this side post debridement. Underwent delayed right reconstruction with TE ADM, course complicated by seroma prior to implant exchange followed by ulceration and exposure implant following implant exchange.  Presented following screening MMG with two areas calcifications left breast, 7.1 cm apart. Biopsy left UOQ with ILC with LCIS. Biopsy left UIQ with ILC with ADH. Both ER/PR+, Her2-. [place] axilla negative.  Final pathology right breast with ALH, flat epithelial atypia, 0/1 LN. Left breast with LCIS, no invasive tumor seen, 0/4 SLN.  On anastrozole.  Prior 38 B, happy with this. 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 Husband is Chief Strategy Officer.   Review of Systems     Objective:   Physical Exam  Cardiovascular: Normal rate and normal heart sounds.  Pulmonary/Chest: Effort normal and breath sounds normal.    Back: flat scar maturing  Chest:  Left chest soft, depression superior to implant and medial to implant, fullness left lateral chest wall, implant centered beneath NAC SN to areola remnant center right 24 cm Nipple left 24 cm Areola remnant center right to IMF R  6 cm nipple to IMF L 8 cm animation  Assessment:     Left breast ca UOQ ER+ ([place]), Left breast ca UIQ ER+ ([facility]) S/p bilateral NSM, dual plane TE/ADM ([facility]) reconstruction S/p removal right TE S/p replacement right TE/ADM S/p removal TE, placement saline implants S/p right LD flap, TE placement    Plan:   Pictures today.   Plan removal  right expander and placement implant. Plan saline smooth round.  Over left, she is satisfied with volume. Over left, fullness lateral chest wall and we have discussed liposuction to left lateral chest possible bilateral. There is depression contour medially and superiorly. This could be addressed with additional ADM to support implant, fat grafting to area, +/- larger diameter implant. Patient at this time has decided to defer any revision to left chest or liposuction.  Reviewed purpose fat grafting to thicken mastectomy flaps, limit visible rippling, treat areas of depression. Reviewed variable take graft, pain and compression over donor site, fat necrosis that presents as masses. Declines this.  Discussed risk COVID infectionthrough this elective surgery. Patient will receive COVID testing prior to surgery. Discussed even if patient receivesa negative test result, the tests in some cases may fail to detect the virus or patient maycontract COVID after the test.COVID 19 infectionbefore/during/aftersurgery may result in lead to a higher chance of complication and death.  Additional risks including but not limited to bleeding, infection, seroma, hematoma, damage to deeper structures, asymmetry, implant rupture, need for additional surgeries, unacceptable cosmetic appearance, DVT/PE, cardiopulmonary complications reviewed.   Plan OP surgery. Rx for Bactrim given. Has pain medication at hime  Natrelle 133S-MX-12 T 400 ml tissue expander placed, 400 ml total fill volume saline  Natrelle Smooth Round Moderate Profile 510 ml saline implants placed bilateral. REF [id number]   LEFT fill volume 530 ml    [name], MD [facility] Plastic & Reconstructive Surgery [phone], pin [phone]

## 2019-04-01 LAB — NOVEL CORONAVIRUS, NAA (HOSP ORDER, SEND-OUT TO REF LAB; TAT 18-24 HRS): SARS-CoV-2, NAA: NOT DETECTED

## 2019-04-03 ENCOUNTER — Other Ambulatory Visit: Payer: Self-pay

## 2019-04-03 ENCOUNTER — Ambulatory Visit (HOSPITAL_BASED_OUTPATIENT_CLINIC_OR_DEPARTMENT_OTHER)
Admission: RE | Admit: 2019-04-03 | Discharge: 2019-04-03 | Disposition: A | Discharge status: Home / Self Care | Payer: Medicare Other | Attending: Plastic Surgery | Admitting: Plastic Surgery

## 2019-04-03 ENCOUNTER — Ambulatory Visit (HOSPITAL_BASED_OUTPATIENT_CLINIC_OR_DEPARTMENT_OTHER): Payer: Medicare Other | Admitting: Anesthesiology

## 2019-04-03 ENCOUNTER — Encounter (HOSPITAL_BASED_OUTPATIENT_CLINIC_OR_DEPARTMENT_OTHER): Payer: Self-pay

## 2019-04-03 ENCOUNTER — Encounter (HOSPITAL_BASED_OUTPATIENT_CLINIC_OR_DEPARTMENT_OTHER)
Admission: RE | Disposition: A | Discharge status: Home / Self Care | Payer: Self-pay | Source: Home / Self Care | Attending: Plastic Surgery

## 2019-04-03 DIAGNOSIS — I10 Essential (primary) hypertension: Secondary | ICD-10-CM | POA: Diagnosis not present

## 2019-04-03 DIAGNOSIS — Z45811 Encounter for adjustment or removal of right breast implant: Secondary | ICD-10-CM | POA: Insufficient documentation

## 2019-04-03 DIAGNOSIS — Z9013 Acquired absence of bilateral breasts and nipples: Secondary | ICD-10-CM | POA: Diagnosis not present

## 2019-04-03 DIAGNOSIS — Z853 Personal history of malignant neoplasm of breast: Secondary | ICD-10-CM | POA: Insufficient documentation

## 2019-04-03 HISTORY — PX: REMOVAL OF TISSUE EXPANDER AND PLACEMENT OF IMPLANT: SHX6457

## 2019-04-03 SURGERY — REMOVAL, TISSUE EXPANDER, BREAST, WITH IMPLANT INSERTION
Anesthesia: General | Site: Breast | Laterality: Right

## 2019-04-03 MED ORDER — ACETAMINOPHEN 500 MG PO TABS
1000.0000 mg | ORAL_TABLET | ORAL | Status: AC
  Administered 2019-04-03: 1000 mg via ORAL

## 2019-04-03 MED ORDER — EPHEDRINE 5 MG/ML INJ
INTRAVENOUS | Status: AC
  Filled 2019-04-03: qty 10

## 2019-04-03 MED ORDER — CEFAZOLIN SODIUM-DEXTROSE 2-4 GM/100ML-% IV SOLN
INTRAVENOUS | Status: AC
  Filled 2019-04-03: qty 100

## 2019-04-03 MED ORDER — FENTANYL CITRATE (PF) 100 MCG/2ML IJ SOLN
50.0000 ug | INTRAMUSCULAR | Status: AC | PRN
  Administered 2019-04-03: 100 ug via INTRAVENOUS
  Administered 2019-04-03 (×2): 50 ug via INTRAVENOUS

## 2019-04-03 MED ORDER — MIDAZOLAM HCL 2 MG/2ML IJ SOLN
INTRAMUSCULAR | Status: AC
  Filled 2019-04-03: qty 2

## 2019-04-03 MED ORDER — FENTANYL CITRATE (PF) 100 MCG/2ML IJ SOLN
25.0000 ug | INTRAMUSCULAR | Status: DC | PRN
  Administered 2019-04-03: 25 ug via INTRAVENOUS

## 2019-04-03 MED ORDER — LIDOCAINE HCL (CARDIAC) PF 100 MG/5ML IV SOSY
PREFILLED_SYRINGE | INTRAVENOUS | Status: DC | PRN
  Administered 2019-04-03: 60 mg via INTRAVENOUS

## 2019-04-03 MED ORDER — LIDOCAINE 2% (20 MG/ML) 5 ML SYRINGE
INTRAMUSCULAR | Status: AC
  Filled 2019-04-03: qty 5

## 2019-04-03 MED ORDER — FENTANYL CITRATE (PF) 100 MCG/2ML IJ SOLN
INTRAMUSCULAR | Status: AC
  Filled 2019-04-03: qty 2

## 2019-04-03 MED ORDER — DEXAMETHASONE SODIUM PHOSPHATE 10 MG/ML IJ SOLN
INTRAMUSCULAR | Status: AC
  Filled 2019-04-03: qty 1

## 2019-04-03 MED ORDER — POVIDONE-IODINE 10 % EX SOLN
CUTANEOUS | Status: DC | PRN
  Administered 2019-04-03: 1 via TOPICAL

## 2019-04-03 MED ORDER — CHLORHEXIDINE GLUCONATE CLOTH 2 % EX PADS: 6.0000 | MEDICATED_PAD | Freq: Once | CUTANEOUS | Status: DC

## 2019-04-03 MED ORDER — DEXAMETHASONE SODIUM PHOSPHATE 4 MG/ML IJ SOLN
INTRAMUSCULAR | Status: DC | PRN
  Administered 2019-04-03: 10 mg via INTRAVENOUS

## 2019-04-03 MED ORDER — PROMETHAZINE HCL 25 MG/ML IJ SOLN: 6.2500 mg | INTRAMUSCULAR | Status: DC | PRN

## 2019-04-03 MED ORDER — ONDANSETRON HCL 4 MG/2ML IJ SOLN
INTRAMUSCULAR | Status: DC | PRN
  Administered 2019-04-03: 4 mg via INTRAVENOUS

## 2019-04-03 MED ORDER — MIDAZOLAM HCL 2 MG/2ML IJ SOLN
1.0000 mg | INTRAMUSCULAR | Status: DC | PRN
  Administered 2019-04-03: 2 mg via INTRAVENOUS

## 2019-04-03 MED ORDER — LACTATED RINGERS IV SOLN
INTRAVENOUS | Status: DC
  Administered 2019-04-03 (×2): via INTRAVENOUS

## 2019-04-03 MED ORDER — ONDANSETRON HCL 4 MG/2ML IJ SOLN
INTRAMUSCULAR | Status: AC
  Filled 2019-04-03: qty 2

## 2019-04-03 MED ORDER — CELECOXIB 200 MG PO CAPS
200.0000 mg | ORAL_CAPSULE | ORAL | Status: AC
  Administered 2019-04-03: 200 mg via ORAL

## 2019-04-03 MED ORDER — CEFAZOLIN SODIUM-DEXTROSE 2-4 GM/100ML-% IV SOLN
2.0000 g | INTRAVENOUS | Status: AC
  Administered 2019-04-03: 2 g via INTRAVENOUS

## 2019-04-03 MED ORDER — CELECOXIB 200 MG PO CAPS
ORAL_CAPSULE | ORAL | Status: AC
  Filled 2019-04-03: qty 1

## 2019-04-03 MED ORDER — GABAPENTIN 300 MG PO CAPS
300.0000 mg | ORAL_CAPSULE | ORAL | Status: AC
  Administered 2019-04-03: 300 mg via ORAL

## 2019-04-03 MED ORDER — EPHEDRINE SULFATE 50 MG/ML IJ SOLN
INTRAMUSCULAR | Status: DC | PRN
  Administered 2019-04-03 (×2): 10 mg via INTRAVENOUS

## 2019-04-03 MED ORDER — PROPOFOL 10 MG/ML IV BOLUS
INTRAVENOUS | Status: DC | PRN
  Administered 2019-04-03: 20 mg via INTRAVENOUS
  Administered 2019-04-03: 160 mg via INTRAVENOUS

## 2019-04-03 MED ORDER — PHENYLEPHRINE 40 MCG/ML (10ML) SYRINGE FOR IV PUSH (FOR BLOOD PRESSURE SUPPORT)
PREFILLED_SYRINGE | INTRAVENOUS | Status: AC
  Filled 2019-04-03: qty 10

## 2019-04-03 MED ORDER — GABAPENTIN 300 MG PO CAPS
ORAL_CAPSULE | ORAL | Status: AC
  Filled 2019-04-03: qty 1

## 2019-04-03 MED ORDER — SCOPOLAMINE 1 MG/3DAYS TD PT72: 1.0000 | MEDICATED_PATCH | Freq: Once | TRANSDERMAL | Status: DC | PRN

## 2019-04-03 MED ORDER — SUCCINYLCHOLINE CHLORIDE 200 MG/10ML IV SOSY
PREFILLED_SYRINGE | INTRAVENOUS | Status: AC
  Filled 2019-04-03: qty 10

## 2019-04-03 MED ORDER — SODIUM CHLORIDE 0.9 % IV SOLN
INTRAVENOUS | Status: DC | PRN
  Administered 2019-04-03: 1000 mL

## 2019-04-03 MED ORDER — ACETAMINOPHEN 500 MG PO TABS
ORAL_TABLET | ORAL | Status: AC
  Filled 2019-04-03: qty 2

## 2019-04-03 SURGICAL SUPPLY — 77 items
ADH SKN CLS APL DERMABOND .7 (GAUZE/BANDAGES/DRESSINGS) ×1
APL PRP STRL LF DISP 70% ISPRP (MISCELLANEOUS) ×2
BAG DECANTER FOR FLEXI CONT (MISCELLANEOUS) ×3 IMPLANT
BINDER BREAST LRG (GAUZE/BANDAGES/DRESSINGS) IMPLANT
BINDER BREAST MEDIUM (GAUZE/BANDAGES/DRESSINGS) IMPLANT
BINDER BREAST XLRG (GAUZE/BANDAGES/DRESSINGS) IMPLANT
BINDER BREAST XXLRG (GAUZE/BANDAGES/DRESSINGS) IMPLANT
BLADE SURG 10 STRL SS (BLADE) ×3 IMPLANT
BLADE SURG 15 STRL LF DISP TIS (BLADE) IMPLANT
BLADE SURG 15 STRL SS (BLADE)
BNDG GAUZE ELAST 4 BULKY (GAUZE/BANDAGES/DRESSINGS) ×6 IMPLANT
CANISTER SUCT 1200ML W/VALVE (MISCELLANEOUS) ×3 IMPLANT
CHLORAPREP W/TINT 26 (MISCELLANEOUS) ×5 IMPLANT
COVER BACK TABLE REUSABLE LG (DRAPES) ×3 IMPLANT
COVER MAYO STAND REUSABLE (DRAPES) ×3 IMPLANT
COVER WAND RF STERILE (DRAPES) IMPLANT
DECANTER SPIKE VIAL GLASS SM (MISCELLANEOUS) IMPLANT
DERMABOND ADVANCED (GAUZE/BANDAGES/DRESSINGS) ×2
DERMABOND ADVANCED .7 DNX12 (GAUZE/BANDAGES/DRESSINGS) ×2 IMPLANT
DRAIN CHANNEL 15F RND FF W/TCR (WOUND CARE) IMPLANT
DRAPE TOP ARMCOVERS (MISCELLANEOUS) ×3 IMPLANT
DRAPE U-SHAPE 76X120 STRL (DRAPES) ×3 IMPLANT
DRAPE UTILITY XL STRL (DRAPES) ×3 IMPLANT
DRSG PAD ABDOMINAL 8X10 ST (GAUZE/BANDAGES/DRESSINGS) ×4 IMPLANT
ELECT BLADE 4.0 EZ CLEAN MEGAD (MISCELLANEOUS) ×3
ELECT COATED BLADE 2.86 ST (ELECTRODE) ×3 IMPLANT
ELECT REM PT RETURN 9FT ADLT (ELECTROSURGICAL) ×3
ELECTRODE BLDE 4.0 EZ CLN MEGD (MISCELLANEOUS) ×1 IMPLANT
ELECTRODE REM PT RTRN 9FT ADLT (ELECTROSURGICAL) ×1 IMPLANT
EVACUATOR SILICONE 100CC (DRAIN) IMPLANT
GLOVE BIO SURGEON STRL SZ 6 (GLOVE) ×8 IMPLANT
GLOVE BIO SURGEON STRL SZ7 (GLOVE) ×4 IMPLANT
GLOVE BIOGEL PI IND STRL 7.5 (GLOVE) IMPLANT
GLOVE BIOGEL PI INDICATOR 7.5 (GLOVE) ×2
GOWN STRL REUS W/ TWL LRG LVL3 (GOWN DISPOSABLE) ×2 IMPLANT
GOWN STRL REUS W/ TWL XL LVL3 (GOWN DISPOSABLE) IMPLANT
GOWN STRL REUS W/TWL LRG LVL3 (GOWN DISPOSABLE) ×3
GOWN STRL REUS W/TWL XL LVL3 (GOWN DISPOSABLE) ×6
IMPL BREAST SALINE 480CC (Breast) IMPLANT
IMPLANT BREAST SALINE 480CC (Breast) ×3 IMPLANT
IV NS 500ML (IV SOLUTION)
IV NS 500ML BAXH (IV SOLUTION) ×1 IMPLANT
KIT FILL SYSTEM UNIVERSAL (SET/KITS/TRAYS/PACK) IMPLANT
MARKER SKIN DUAL TIP RULER LAB (MISCELLANEOUS) IMPLANT
NDL FILTER BLUNT 18X1 1/2 (NEEDLE) IMPLANT
NDL HYPO 25X1 1.5 SAFETY (NEEDLE) IMPLANT
NEEDLE FILTER BLUNT 18X 1/2SAF (NEEDLE)
NEEDLE FILTER BLUNT 18X1 1/2 (NEEDLE) IMPLANT
NEEDLE HYPO 25X1 1.5 SAFETY (NEEDLE) IMPLANT
PACK BASIN DAY SURGERY FS (CUSTOM PROCEDURE TRAY) ×3 IMPLANT
PENCIL BUTTON HOLSTER BLD 10FT (ELECTRODE) ×3 IMPLANT
PIN SAFETY STERILE (MISCELLANEOUS) IMPLANT
SHEET MEDIUM DRAPE 40X70 STRL (DRAPES) ×6 IMPLANT
SIZER BREAST SGL USE 480CC (SIZER) ×3
SIZER BREAST SGL USE 510CC (SIZER) ×3
SIZER BRST SGL USE 480CC (SIZER) IMPLANT
SIZER BRST SGL USE 510CC (SIZER) IMPLANT
SLEEVE SCD COMPRESS KNEE MED (MISCELLANEOUS) ×3 IMPLANT
SPONGE LAP 18X18 RF (DISPOSABLE) ×6 IMPLANT
STAPLER VISISTAT 35W (STAPLE) ×3 IMPLANT
SUT ETHILON 2 0 FS 18 (SUTURE) IMPLANT
SUT MNCRL AB 4-0 PS2 18 (SUTURE) ×3 IMPLANT
SUT PDS AB 2-0 CT2 27 (SUTURE) ×2 IMPLANT
SUT SILK 2 0 SH (SUTURE) IMPLANT
SUT VIC AB 3-0 PS1 18 (SUTURE)
SUT VIC AB 3-0 PS1 18XBRD (SUTURE) IMPLANT
SUT VIC AB 3-0 SH 27 (SUTURE) ×3
SUT VIC AB 3-0 SH 27X BRD (SUTURE) ×1 IMPLANT
SUT VICRYL 4-0 PS2 18IN ABS (SUTURE) ×3 IMPLANT
SYR 20CC LL (SYRINGE) IMPLANT
SYR BULB IRRIGATION 50ML (SYRINGE) ×6 IMPLANT
SYR CONTROL 10ML LL (SYRINGE) IMPLANT
TOWEL GREEN STERILE FF (TOWEL DISPOSABLE) ×6 IMPLANT
TUBE CONNECTING 20'X1/4 (TUBING) ×2
TUBE CONNECTING 20X1/4 (TUBING) ×4 IMPLANT
UNDERPAD 30X30 (UNDERPADS AND DIAPERS) ×6 IMPLANT
YANKAUER SUCT BULB TIP NO VENT (SUCTIONS) ×3 IMPLANT

## 2019-04-03 NOTE — Transfer of Care (Signed)
Immediate Anesthesia Transfer of Care Note  Patient: Misty Lynch  Procedure(s) Performed: REMOVAL OF RIGHT TISSUE EXPANDER AND PLACEMENT OF SALINE IMPLANT (Right Breast)  Patient Location: PACU  Anesthesia Type:General  Level of Consciousness: sedated  Airway & Oxygen Therapy: Patient Spontanous Breathing and Patient connected to face mask oxygen  Post-op Assessment: Report given to RN and Post -op Vital signs reviewed and stable  Post vital signs: Reviewed and stable  Last Vitals:  Vitals Value Taken Time  BP 145/65 04/03/2019  9:00 AM  Temp    Pulse 70 04/03/2019  9:01 AM  Resp 11 04/03/2019  9:01 AM  SpO2 100 % 04/03/2019  9:01 AM  Vitals shown include unvalidated device data.  Last Pain:  Vitals:   04/03/19 0639  TempSrc: Oral  PainSc: 0-No pain         Complications: No apparent anesthesia complications

## 2019-04-03 NOTE — Interval H&P Note (Signed)
History and Physical Interval Note:  04/03/2019 6:58 AM  Misty Lynch  has presented today for surgery, with the diagnosis of History breast cancer, acquired absence breasts.  The various methods of treatment have been discussed with the patient and family. After consideration of risks, benefits and other options for treatment, the patient has consented to  Procedure(s): REMOVAL OF RIGHT TISSUE EXPANDER AND PLACEMENT OF SALINE IMPLANT (Right) as a surgical intervention.  The patient's history has been reviewed, patient examined, no change in status, stable for surgery.  I have reviewed the patient's chart and labs.  Questions were answered to the patient's satisfaction.     Arnoldo Hooker Pilar Westergaard

## 2019-04-03 NOTE — Discharge Instructions (Signed)

## 2019-04-03 NOTE — Anesthesia Procedure Notes (Signed)
Procedure Name: LMA Insertion Date/Time: 04/03/2019 7:29 AM Performed by: Willa Frater, CRNA Pre-anesthesia Checklist: Patient identified, Emergency Drugs available, Suction available and Patient being monitored Patient Re-evaluated:Patient Re-evaluated prior to induction Oxygen Delivery Method: Circle system utilized Preoxygenation: Pre-oxygenation with 100% oxygen Induction Type: IV induction Ventilation: Mask ventilation without difficulty LMA: LMA inserted LMA Size: 4.0 Number of attempts: 1 Airway Equipment and Method: Bite block Placement Confirmation: positive ETCO2 Tube secured with: Tape Dental Injury: Teeth and Oropharynx as per pre-operative assessment

## 2019-04-03 NOTE — Op Note (Signed)
Operative Note   DATE OF OPERATION: 6.5.20  LOCATION: [facility]-outpatient  SURGICAL DIVISION: Plastic Surgery  PREOPERATIVE DIAGNOSES:  1. History breast cancer 2. Acquired absence breasts  POSTOPERATIVE DIAGNOSES:  same  PROCEDURE:  Removal right chest tissue expander and placement saline implant  SURGEON: [name] MD MBA  ASSISTANT: none  ANESTHESIA:  General.   EBL: 5 ml  COMPLICATIONS: None immediate.   INDICATIONS FOR PROCEDURE:  The patient, Misty Lynch, is a 63 y.o. female born on 22-Jun-1956, is here for staged breast reconstruction following right latissimus dorsi flap with tissue expander placement. She has a history bilateral nipple sparing mastectomies with course complicated by right mastectomy flap necrosis and eventual removal expander.   FINDINGS: Natrelle Smooth Round Moderate Profile Saline implant placed, 480 ml filled to 490 ml. REF [id number]  DESCRIPTION OF PROCEDURE:  The patient's operative site was marked with the patient in the preoperative area. The patient was taken to the operating room. SCDs were placed and IV antibiotics were given. The patient's operative site was prepped and draped in a sterile fashion. A time out was performed and all information was confirmed to be correct. Incision made in right inframammary fold scar and carried through superficial fascia to latissimus muscle. Muscle divided in direction fibers. Expander removed. Capsulotomies performed medially and superiorly. Additional anterior capsule scoring completed. Inferior capsulotomies performed and inframammary fold stabilized with interrupted 2-0 PDS from chest wall to latissimus dorsi muscle and superficial fascia of soft tissue caudal to incision. Sizer placed. Patient brought to upright sitting position, 68MP-480 implant selected. Patient returned to supine position.  Cavity irrigated with saline solution containing Ancef, gentamicin, and bacitracin.  Hemostasis ensured. Cavity irrigated with Betadine saline solution. Implant prepared and placed in right chest. Implant filled to 490 ml and fill tubing removed. Closure fill tab and implant orientation ensured. Closure completed with 3-0 vicryl to approximate latissimus muscle over implant, 4-0 vicryl in dermis, 4-0 monocryl subcuticular skin closure. Dermabond applied followed by dry dressing, breast binder.  The patient was allowed to wake from anesthesia, extubated and taken to the recovery room in satisfactory condition.   SPECIMENS: none  DRAINS: none  [name], MD [facility] Plastic & Reconstructive Surgery [phone], pin [phone]

## 2019-04-03 NOTE — Anesthesia Preprocedure Evaluation (Signed)
Anesthesia Evaluation  Patient identified by MRN, date of birth, ID band Patient awake    Reviewed: Allergy & Precautions, NPO status , Patient's Chart, lab work & pertinent test results  Airway Mallampati: II  TM Distance: >3 FB     Dental  (+) Dental Advisory Given   Pulmonary neg pulmonary ROS,    breath sounds clear to auscultation       Cardiovascular hypertension, Pt. on medications  Rhythm:Regular Rate:Normal     Neuro/Psych negative neurological ROS     GI/Hepatic Neg liver ROS, GERD  ,  Endo/Other  negative endocrine ROS  Renal/GU negative Renal ROS     Musculoskeletal   Abdominal   Peds  Hematology negative hematology ROS (+)   Anesthesia Other Findings   Reproductive/Obstetrics                             Lab Results  Component Value Date   WBC 7.7 10/27/2018   HGB 13.8 10/27/2018   HCT 42.4 10/27/2018   MCV 89.3 10/27/2018   PLT 288 10/27/2018   Lab Results  Component Value Date   CREATININE 0.80 03/31/2019   BUN 11 03/31/2019   NA 141 03/31/2019   K 3.4 (L) 03/31/2019   CL 102 03/31/2019   CO2 29 03/31/2019    Anesthesia Physical Anesthesia Plan  ASA: II  Anesthesia Plan: General   Post-op Pain Management:    Induction: Intravenous  PONV Risk Score and Plan: 3 and Midazolam, Dexamethasone, Ondansetron and Treatment may vary due to age or medical condition  Airway Management Planned: Oral ETT and LMA  Additional Equipment:   Intra-op Plan:   Post-operative Plan: Extubation in OR  Informed Consent: I have reviewed the patients History and Physical, chart, labs and discussed the procedure including the risks, benefits and alternatives for the proposed anesthesia with the patient or authorized representative who has indicated his/her understanding and acceptance.     Dental advisory given  Plan Discussed with: CRNA  Anesthesia Plan Comments:          Anesthesia Quick Evaluation

## 2019-04-13 NOTE — Anesthesia Postprocedure Evaluation (Signed)
Anesthesia Post Note  Patient: Misty Lynch  Procedure(s) Performed: REMOVAL OF RIGHT TISSUE EXPANDER AND PLACEMENT OF SALINE IMPLANT (Right Breast)     Patient location during evaluation: PACU Anesthesia Type: General Level of consciousness: awake and alert Pain management: pain level controlled Vital Signs Assessment: post-procedure vital signs reviewed and stable Respiratory status: spontaneous breathing, nonlabored ventilation, respiratory function stable and patient connected to nasal cannula oxygen Cardiovascular status: blood pressure returned to baseline and stable Postop Assessment: no apparent nausea or vomiting Anesthetic complications: no    Last Vitals:  Vitals:   04/03/19 1000 04/03/19 1015  BP: 138/62 (!) 155/77  Pulse: 73 81  Resp: 12 18  Temp:  37.1 C  SpO2: 100% 97%    Last Pain:  Vitals:   04/03/19 1015  TempSrc:   PainSc: 0-No pain                 Tiajuana Amass

## 2019-04-22 ENCOUNTER — Telehealth (HOSPITAL_COMMUNITY): Payer: Self-pay | Admitting: *Deleted

## 2019-04-22 NOTE — Telephone Encounter (Signed)
Left message about echo appointment and asked patient to call office for COVID screening. 

## 2019-04-23 ENCOUNTER — Other Ambulatory Visit: Payer: Self-pay

## 2019-04-23 ENCOUNTER — Ambulatory Visit (HOSPITAL_COMMUNITY): Payer: Medicare Other | Attending: Cardiology

## 2019-04-23 DIAGNOSIS — R011 Cardiac murmur, unspecified: Secondary | ICD-10-CM | POA: Insufficient documentation

## 2019-06-24 ENCOUNTER — Telehealth: Payer: Self-pay | Admitting: Hematology and Oncology

## 2019-06-24 NOTE — Assessment & Plan Note (Signed)
08/23/2017 left mastectomy: LCIS, invasive cancer not identified in the specimen, 0/4 lymph nodes negative, based on the biopsy, 2 foci of invasive lobular cancer larger span 0.7 cm grade 2 ER 40%, PR 90%, HER-2 negative ratio 1.19, Ki-67 2%, T1BN0 stage I A; right mastectomy: Atypical lobular hyperplasia  Current treatment: Adjuvant anastrozole 1 mg daily started 09/02/2017  Anastrozole toxicities: Denies any adverse effects to anastrozole  Limitation of range of motion  Breast cancer surveillance: 1.  Breast exam 07/02/2018: Benign 2. mammogram not indicated since she had bilateral mastectomies: Clinical examination did not reveal any lumps or nodules of concern. 3.  Bone density 12/07/2016 osteopenia T score -1.2  Return to clinic 1 year for follow-up

## 2019-06-24 NOTE — Telephone Encounter (Signed)
I left a message regarding video visit  °

## 2019-06-30 NOTE — Progress Notes (Signed)
HEMATOLOGY-ONCOLOGY MYCHART VIDEO VISIT PROGRESS NOTE  I connected with Misty Lynch on 07/01/2019 at  8:45 AM EDT by MyChart video conference and verified that I am speaking with the correct person using two identifiers.  I discussed the limitations, risks, security and privacy concerns of performing an evaluation and management service by MyChart and the availability of in person appointments.  I also discussed with the patient that there may be a patient responsible charge related to this service. The patient expressed understanding and agreed to proceed.  Patient's Location: Home Physician Location: Clinic  CHIEF COMPLIANT: Follow-up of left breast cancer on anastrozole  INTERVAL HISTORY: Misty Lynch is a 63 y.o. female with above-mentioned history of left breast cancer treated with bilateral mastectomies with reconstruction and who is currently on antiestrogen therapy with anastrozole. I last saw her a year ago. She presents over MyChart today for annual follow-up.  She is tolerating anastrozole extremely well without any problems or concerns but she does have occasional aches and pains.  Her breast reconstruction was completed in June.  Oncology History  Malignant neoplasm of upper-outer quadrant of left breast in female, estrogen receptor positive (HCC)  06/24/2017 Initial Diagnosis   Left breast biopsy upper outer quadrant: Invasive lobular cancer with LCIS; upper inner quadrant: Invasive lobular cancer with ADH, grade 1-2, ER 40%, PR 90%, Ki-67 2%, HER-2 negative ratio 1.19; tumor size indeterminate but based on the 2 mm calcifications it is currently being staged as T1 1 N0 stage IA, however we suspect that it is inaccurate clinical staging   08/23/2017 Surgery   Left mastectomy: LCIS, invasive cancer not identified in the specimen, 0/4 lymph nodes negative, based on the biopsy, 2 foci of invasive lobular cancer larger span 0.7 cm grade 2 ER 40%, PR 90%, HER-2 negative ratio 1.19,  Ki-67 2%, T1BN0 stage I A;  right mastectomy: Atypical lobular hyperplasia   08/2017 -  Anti-estrogen oral therapy   Anastrozole daily   09/17/2017 Surgery   Breast reconstruction surgery by Dr. Thimmappa     REVIEW OF SYSTEMS:   Constitutional: Denies fevers, chills or abnormal weight loss Eyes: Denies blurriness of vision Ears, nose, mouth, throat, and face: Denies mucositis or sore throat Respiratory: Denies cough, dyspnea or wheezes Cardiovascular: Denies palpitation, chest discomfort Gastrointestinal:  Denies nausea, heartburn or change in bowel habits Skin: Denies abnormal skin rashes Lymphatics: Denies new lymphadenopathy or easy bruising Neurological:Denies numbness, tingling or new weaknesses Behavioral/Psych: Mood is stable, no new changes  Extremities: No lower extremity edema Breast: s/p bilateral mastectomies with reconstruction  All other systems were reviewed with the patient and are negative.  Observations/Objective:  Today's Vitals   07/01/19 0915  BP: (!) 158/63  Pulse: (!) 55  Resp: 18  Temp: 98.2 F (36.8 C)  TempSrc: Oral  SpO2: 100%  Weight: 192 lb 8 oz (87.3 kg)  Height: 5' 4.5" (1.638 m)   Body mass index is 32.53 kg/m.  I have reviewed the data as listed CMP Latest Ref Rng & Units 03/31/2019 10/27/2018 03/19/2018  Glucose 70 - 99 mg/dL 113(H) 80 109(H)  BUN 8 - 23 mg/dL 11 13 9  Creatinine 0.44 - 1.00 mg/dL 0.80 0.79 0.93  Sodium 135 - 145 mmol/L 141 140 138  Potassium 3.5 - 5.1 mmol/L 3.4(L) 3.0(L) 3.8  Chloride 98 - 111 mmol/L 102 101 98(L)  CO2 22 - 32 mmol/L 29 28 29  Calcium 8.9 - 10.3 mg/dL 9.8 9.5 9.3  Total Protein 6.4 -   8.3 g/dL - - -  Total Bilirubin 0.20 - 1.20 mg/dL - - -  Alkaline Phos 40 - 150 U/L - - -  AST 5 - 34 U/L - - -  ALT 0 - 55 U/L - - -    Lab Results  Component Value Date   WBC 7.7 10/27/2018   HGB 13.8 10/27/2018   HCT 42.4 10/27/2018   MCV 89.3 10/27/2018   PLT 288 10/27/2018   NEUTROABS 4.7 10/27/2018       Assessment Plan:  Malignant neoplasm of upper-outer quadrant of left breast in female, estrogen receptor positive (HCC) 08/23/2017 left mastectomy: LCIS, invasive cancer not identified in the specimen, 0/4 lymph nodes negative, based on the biopsy, 2 foci of invasive lobular cancer larger span 0.7 cm grade 2 ER 40%, PR 90%, HER-2 negative ratio 1.19, Ki-67 2%, T1BN0 stage I A; right mastectomy: Atypical lobular hyperplasia  Current treatment: Adjuvant anastrozole 1 mg daily started 09/02/2017  Anastrozole toxicities: Denies any adverse effects to anastrozole  Limitation of range of motion  Breast cancer surveillance: 1.  Breast exam 07/02/2018: Benign 2. mammogram not indicated since she had bilateral mastectomies: Clinical examination did not reveal any lumps or nodules of concern. 3.  Bone density 12/07/2016 osteopenia T score -1.2  Return to clinic 1 year for follow-up    I discussed the assessment and treatment plan with the patient. The patient was provided an opportunity to ask questions and all were answered. The patient agreed with the plan and demonstrated an understanding of the instructions. The patient was advised to call back or seek an in-person evaluation if the symptoms worsen or if the condition fails to improve as anticipated.   I provided 15 minutes of face-to-face MyChart video visit time during this encounter.    Vinay K Gudena, MD 07/01/2019   I, Molly Dorshimer, am acting as scribe for Vinay Gudena, MD.  I have reviewed the above documentation for accuracy and completeness, and I agree with the above.    

## 2019-07-01 ENCOUNTER — Other Ambulatory Visit: Payer: Self-pay

## 2019-07-01 ENCOUNTER — Inpatient Hospital Stay: Payer: Medicare Other | Attending: Hematology and Oncology | Admitting: Hematology and Oncology

## 2019-07-01 DIAGNOSIS — Z170 Estrogen receptor positive status [ER+]: Secondary | ICD-10-CM

## 2019-07-01 DIAGNOSIS — C50412 Malignant neoplasm of upper-outer quadrant of left female breast: Secondary | ICD-10-CM | POA: Diagnosis not present

## 2019-07-01 MED ORDER — ANASTROZOLE 1 MG PO TABS: 1.0000 mg | ORAL_TABLET | Freq: Every day | ORAL | 3 refills | Status: DC

## 2019-07-02 ENCOUNTER — Telehealth: Payer: Self-pay | Admitting: Hematology and Oncology

## 2019-07-02 NOTE — Telephone Encounter (Signed)
I left a message regarding schedule  

## 2019-08-04 DIAGNOSIS — F419 Anxiety disorder, unspecified: Secondary | ICD-10-CM | POA: Diagnosis not present

## 2019-08-04 DIAGNOSIS — M25561 Pain in right knee: Secondary | ICD-10-CM | POA: Diagnosis not present

## 2019-08-04 DIAGNOSIS — Z23 Encounter for immunization: Secondary | ICD-10-CM | POA: Diagnosis not present

## 2019-08-26 DIAGNOSIS — Z6832 Body mass index (BMI) 32.0-32.9, adult: Secondary | ICD-10-CM | POA: Diagnosis not present

## 2019-08-26 DIAGNOSIS — I10 Essential (primary) hypertension: Secondary | ICD-10-CM | POA: Diagnosis not present

## 2019-08-26 DIAGNOSIS — C50912 Malignant neoplasm of unspecified site of left female breast: Secondary | ICD-10-CM | POA: Diagnosis not present

## 2019-08-26 DIAGNOSIS — R7303 Prediabetes: Secondary | ICD-10-CM | POA: Diagnosis not present

## 2019-08-26 DIAGNOSIS — F419 Anxiety disorder, unspecified: Secondary | ICD-10-CM | POA: Diagnosis not present

## 2019-08-26 DIAGNOSIS — M8580 Other specified disorders of bone density and structure, unspecified site: Secondary | ICD-10-CM | POA: Diagnosis not present

## 2019-08-26 DIAGNOSIS — E6609 Other obesity due to excess calories: Secondary | ICD-10-CM | POA: Diagnosis not present

## 2019-08-26 DIAGNOSIS — Z23 Encounter for immunization: Secondary | ICD-10-CM | POA: Diagnosis not present

## 2019-08-26 DIAGNOSIS — Z0000 Encounter for general adult medical examination without abnormal findings: Secondary | ICD-10-CM | POA: Diagnosis not present

## 2019-08-30 IMAGING — CT CT HEART SCORING
4 series · 12 of 20 positions shown, 13 images · non-contrast
Comparison: None.

CLINICAL DATA: Hypercholesterolemia

EXAM:
CT HEART FOR CALCIUM SCORING
TECHNIQUE: CT heart was performed on a 64 channel system using prospective ECG
gating.
A non-contrast exam for calcium scoring was performed.
Note that this exam targets the heart and the chest was not imaged
in its entirety.

[Series 2: calcium scoring 2.00 qr36 bestdiast 71% · axial · 0.35mm/px · z∈[+1504,+1584]mm · 3 of 80 slices shown, 4 images]
[im 20/80  vessel]
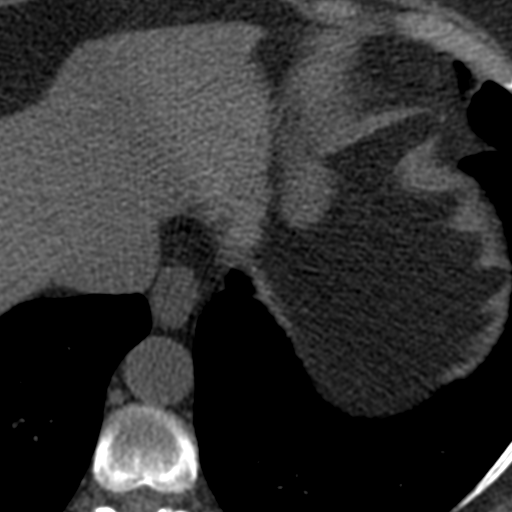
[im 20/80  lung]
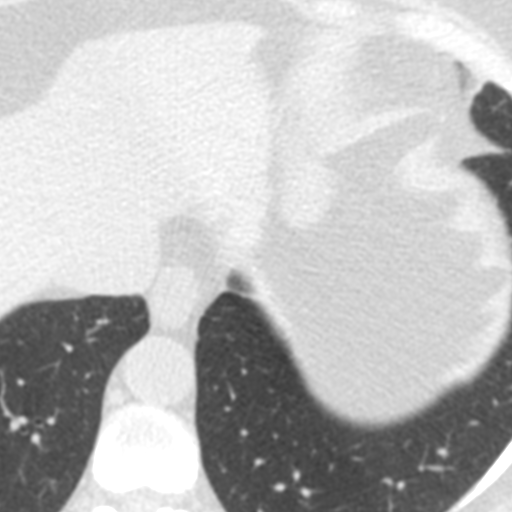
[im 40/80  vessel]
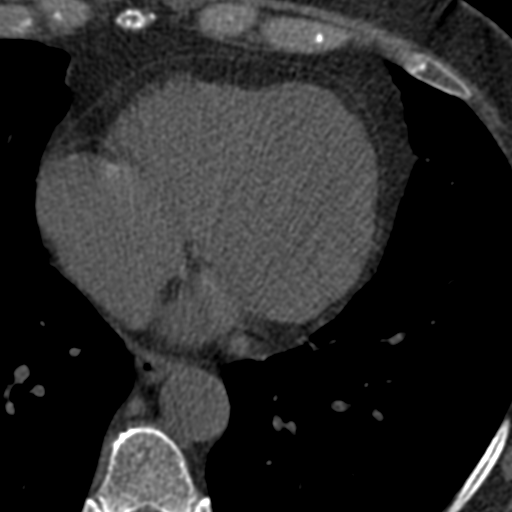
[im 60/80  vessel]
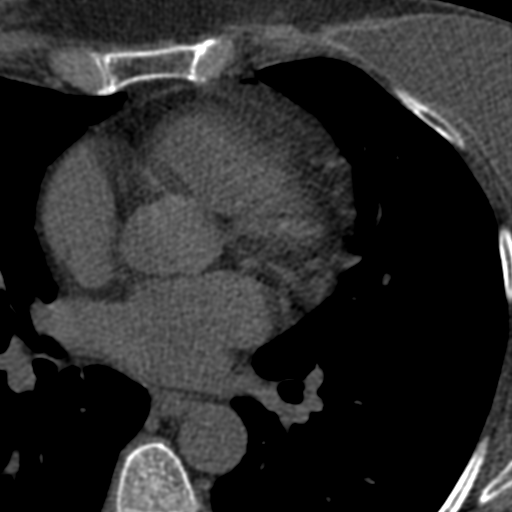

[Series 3: calcium scoring 2.00 br40 bestdiast 71% ax fov · axial · 0.35mm/px · z∈[+1504,+1584]mm · 3 of 80 slices shown (1 of 2)]
[im 20/80  vessel]
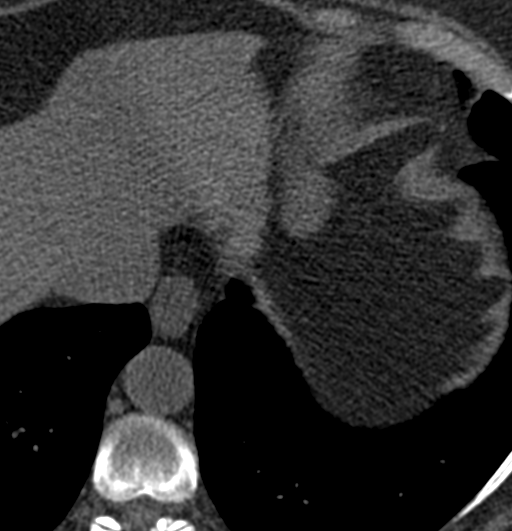
[im 40/80  vessel]
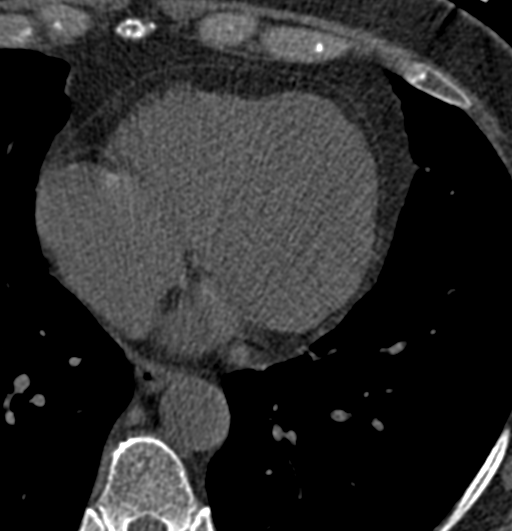
[im 60/80  vessel]
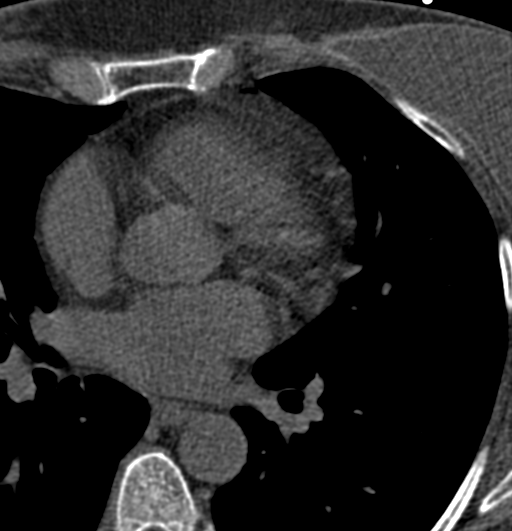

[Series 9: calcium scoring 2.00 br60 bestdiast 71% ax fov · axial · 0.53mm/px · z∈[+1504,+1584]mm · 3 of 80 slices shown]
[im 20/80  vessel]
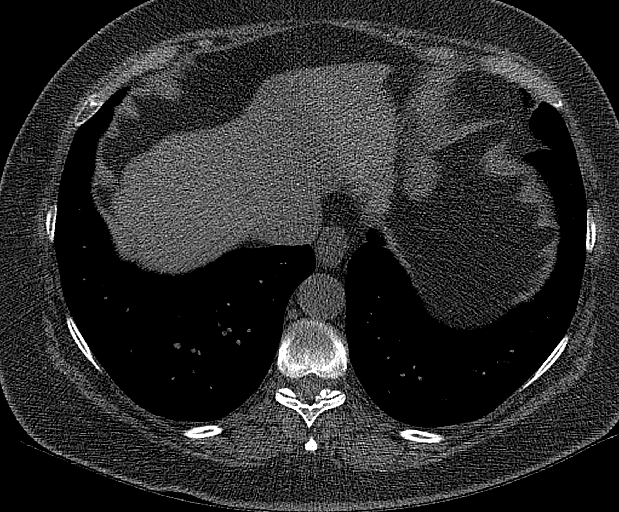
[im 40/80  vessel]
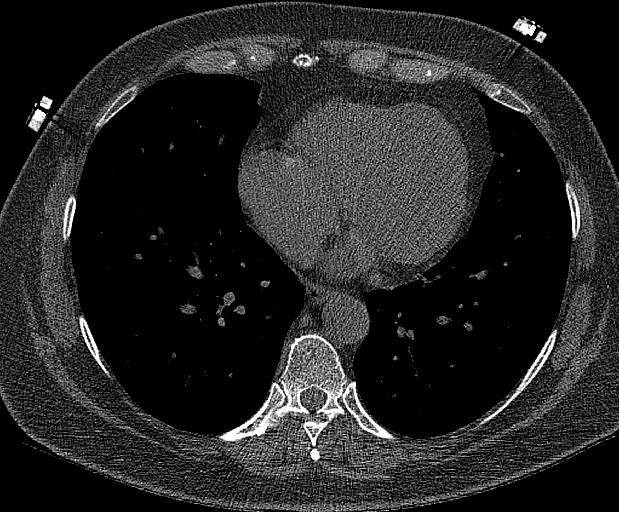
[im 60/80  vessel]
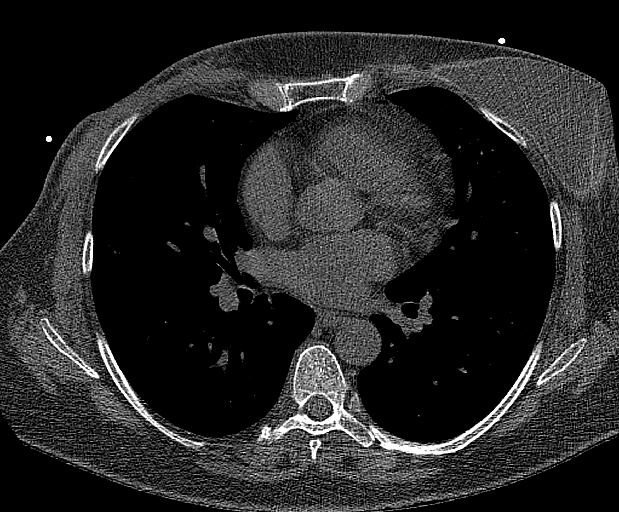

[Series 12: calcium scoring 2.00 br40 bestdiast 71% ax fov · axial · 0.51mm/px · z∈[+1504,+1584]mm · 3 of 80 slices shown (2 of 2)]
[im 20/80  vessel]
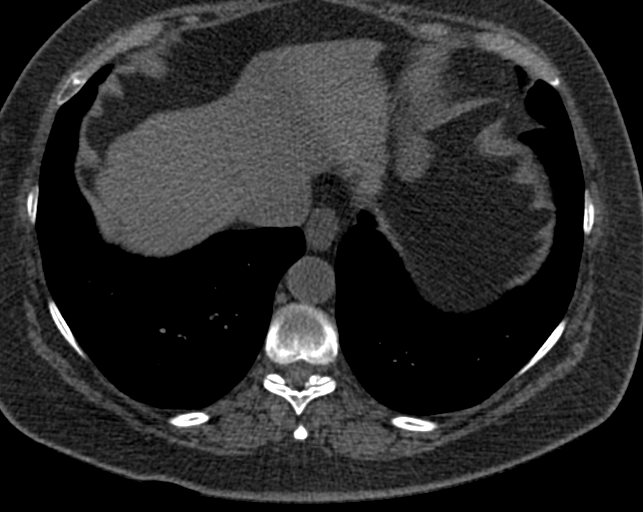
[im 40/80  vessel]
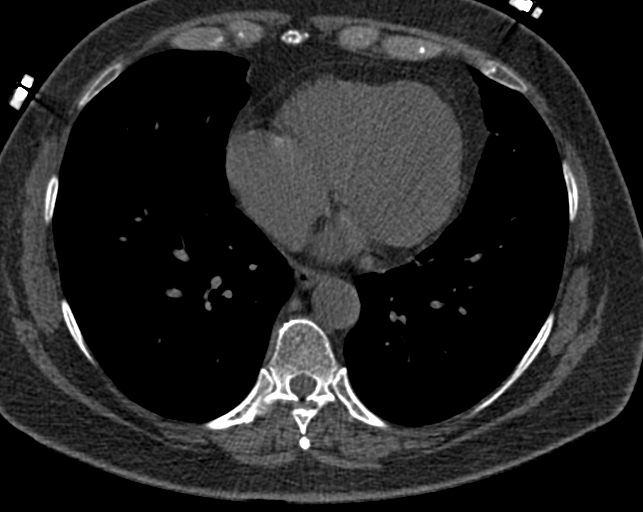
[im 60/80  vessel]
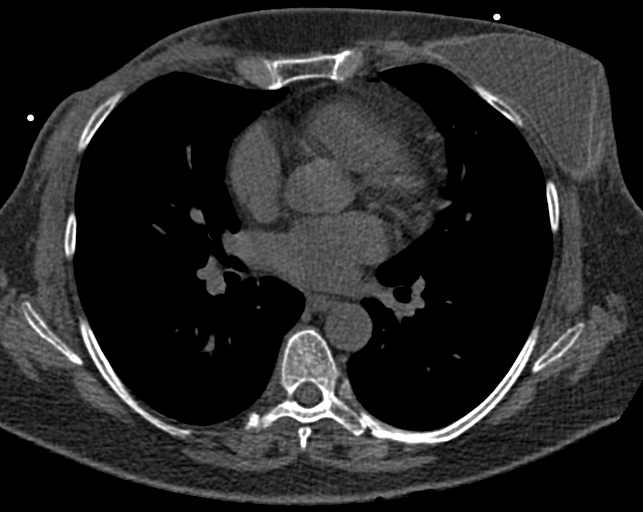

[12 of 20 positions shown; findings below may reference images not displayed]

FINDINGS: Technical quality: Good.

CORONARY CALCIUM

Total Agatston Score: 0

[HOSPITAL] percentile:  0

OTHER FINDINGS:

Cardiovascular: Heart is normal size. Visualized aorta is normal
caliber.

Mediastinum/Nodes: No adenopathy in the lower mediastinum or hila.

Lungs/Pleura: Visualized lungs clear.  No effusions.

Upper Abdomen: Imaging into the upper abdomen shows no acute
findings.

Musculoskeletal: Left breast implant noted. Apparent surgical
changes in the right breast/chest wall. No acute bony abnormality.
No acute bony abnormality.
IMPRESSION: No visible coronary artery calcifications. Total coronary calcium
score of 0.

No acute extra cardiac abnormality.

## 2019-12-10 DIAGNOSIS — N650 Deformity of reconstructed breast: Secondary | ICD-10-CM | POA: Diagnosis not present

## 2019-12-10 DIAGNOSIS — Z853 Personal history of malignant neoplasm of breast: Secondary | ICD-10-CM | POA: Diagnosis not present

## 2019-12-10 DIAGNOSIS — T8542XA Displacement of breast prosthesis and implant, initial encounter: Secondary | ICD-10-CM | POA: Diagnosis not present

## 2019-12-10 DIAGNOSIS — Z9013 Acquired absence of bilateral breasts and nipples: Secondary | ICD-10-CM | POA: Diagnosis not present

## 2020-03-24 DIAGNOSIS — M25511 Pain in right shoulder: Secondary | ICD-10-CM | POA: Diagnosis not present

## 2020-03-24 DIAGNOSIS — M25512 Pain in left shoulder: Secondary | ICD-10-CM | POA: Diagnosis not present

## 2020-03-24 DIAGNOSIS — M5030 Other cervical disc degeneration, unspecified cervical region: Secondary | ICD-10-CM | POA: Diagnosis not present

## 2020-03-24 DIAGNOSIS — M542 Cervicalgia: Secondary | ICD-10-CM | POA: Diagnosis not present

## 2020-03-25 DIAGNOSIS — M2569 Stiffness of other specified joint, not elsewhere classified: Secondary | ICD-10-CM | POA: Diagnosis not present

## 2020-03-25 DIAGNOSIS — M542 Cervicalgia: Secondary | ICD-10-CM | POA: Diagnosis not present

## 2020-03-25 DIAGNOSIS — M6258 Muscle wasting and atrophy, not elsewhere classified, other site: Secondary | ICD-10-CM | POA: Diagnosis not present

## 2020-03-25 DIAGNOSIS — F339 Major depressive disorder, recurrent, unspecified: Secondary | ICD-10-CM | POA: Diagnosis not present

## 2020-03-25 DIAGNOSIS — R293 Abnormal posture: Secondary | ICD-10-CM | POA: Diagnosis not present

## 2020-03-25 DIAGNOSIS — I10 Essential (primary) hypertension: Secondary | ICD-10-CM | POA: Diagnosis not present

## 2020-03-30 DIAGNOSIS — M542 Cervicalgia: Secondary | ICD-10-CM | POA: Diagnosis not present

## 2020-03-30 DIAGNOSIS — F339 Major depressive disorder, recurrent, unspecified: Secondary | ICD-10-CM | POA: Diagnosis not present

## 2020-03-30 DIAGNOSIS — R293 Abnormal posture: Secondary | ICD-10-CM | POA: Diagnosis not present

## 2020-03-30 DIAGNOSIS — M2569 Stiffness of other specified joint, not elsewhere classified: Secondary | ICD-10-CM | POA: Diagnosis not present

## 2020-03-30 DIAGNOSIS — I10 Essential (primary) hypertension: Secondary | ICD-10-CM | POA: Diagnosis not present

## 2020-03-30 DIAGNOSIS — M6258 Muscle wasting and atrophy, not elsewhere classified, other site: Secondary | ICD-10-CM | POA: Diagnosis not present

## 2020-04-01 DIAGNOSIS — F339 Major depressive disorder, recurrent, unspecified: Secondary | ICD-10-CM | POA: Diagnosis not present

## 2020-04-01 DIAGNOSIS — M6258 Muscle wasting and atrophy, not elsewhere classified, other site: Secondary | ICD-10-CM | POA: Diagnosis not present

## 2020-04-01 DIAGNOSIS — M2569 Stiffness of other specified joint, not elsewhere classified: Secondary | ICD-10-CM | POA: Diagnosis not present

## 2020-04-01 DIAGNOSIS — I10 Essential (primary) hypertension: Secondary | ICD-10-CM | POA: Diagnosis not present

## 2020-04-01 DIAGNOSIS — R293 Abnormal posture: Secondary | ICD-10-CM | POA: Diagnosis not present

## 2020-04-01 DIAGNOSIS — M542 Cervicalgia: Secondary | ICD-10-CM | POA: Diagnosis not present

## 2020-05-02 DIAGNOSIS — H60543 Acute eczematoid otitis externa, bilateral: Secondary | ICD-10-CM | POA: Diagnosis not present

## 2020-05-02 DIAGNOSIS — H6121 Impacted cerumen, right ear: Secondary | ICD-10-CM | POA: Diagnosis not present

## 2020-05-02 DIAGNOSIS — H6091 Unspecified otitis externa, right ear: Secondary | ICD-10-CM | POA: Diagnosis not present

## 2020-06-21 DIAGNOSIS — L719 Rosacea, unspecified: Secondary | ICD-10-CM | POA: Diagnosis not present

## 2020-06-21 DIAGNOSIS — L219 Seborrheic dermatitis, unspecified: Secondary | ICD-10-CM | POA: Diagnosis not present

## 2020-06-21 DIAGNOSIS — L821 Other seborrheic keratosis: Secondary | ICD-10-CM | POA: Diagnosis not present

## 2020-06-28 DIAGNOSIS — F419 Anxiety disorder, unspecified: Secondary | ICD-10-CM | POA: Diagnosis not present

## 2020-06-28 DIAGNOSIS — E559 Vitamin D deficiency, unspecified: Secondary | ICD-10-CM | POA: Diagnosis not present

## 2020-06-28 DIAGNOSIS — E7800 Pure hypercholesterolemia, unspecified: Secondary | ICD-10-CM | POA: Diagnosis not present

## 2020-06-28 DIAGNOSIS — R7303 Prediabetes: Secondary | ICD-10-CM | POA: Diagnosis not present

## 2020-06-28 DIAGNOSIS — I10 Essential (primary) hypertension: Secondary | ICD-10-CM | POA: Diagnosis not present

## 2020-06-30 ENCOUNTER — Ambulatory Visit: Payer: Medicare Other | Admitting: Hematology and Oncology

## 2020-07-04 ENCOUNTER — Other Ambulatory Visit: Payer: Self-pay | Admitting: Hematology and Oncology

## 2020-07-06 DIAGNOSIS — E876 Hypokalemia: Secondary | ICD-10-CM | POA: Diagnosis not present

## 2020-07-07 DIAGNOSIS — F419 Anxiety disorder, unspecified: Secondary | ICD-10-CM | POA: Diagnosis not present

## 2020-07-25 NOTE — Progress Notes (Signed)
Patient Care Team: Kathyrn Lass, MD as PCP - General (Family Medicine) Excell Seltzer, MD (Inactive) as Consulting Physician (General Surgery) Nicholas Lose, MD as Consulting Physician (Hematology and Oncology) Kyung Rudd, MD as Consulting Physician (Radiation Oncology) Gardenia Phlegm, NP as Nurse Practitioner (Hematology and Oncology)  DIAGNOSIS:    ICD-10-CM   1. Malignant neoplasm of upper-outer quadrant of left breast in female, estrogen receptor positive ([place])  C50.412    Z17.0     SUMMARY OF ONCOLOGIC HISTORY: Oncology History  Malignant neoplasm of upper-outer quadrant of left breast in female, estrogen receptor positive ([place])  06/24/2017 Initial Diagnosis   Left breast biopsy upper outer quadrant: Invasive lobular cancer with LCIS; upper inner quadrant: Invasive lobular cancer with ADH, grade 1-2, ER 40%, PR 90%, Ki-67 2%, HER-2 negative ratio 1.19; tumor size indeterminate but based on the 2 mm calcifications it is currently being staged as T1 1 N0 stage IA, however we suspect that it is inaccurate clinical staging   08/23/2017 Surgery   Left mastectomy: LCIS, invasive cancer not identified in the specimen, 0/4 lymph nodes negative, based on the biopsy, 2 foci of invasive lobular cancer larger span 0.7 cm grade 2 ER 40%, PR 90%, HER-2 negative ratio 1.19, Ki-67 2%, T1BN0 stage I A;  right mastectomy: Atypical lobular hyperplasia   08/2017 -  Anti-estrogen oral therapy   Anastrozole daily   09/17/2017 Surgery   Breast reconstruction surgery by Dr. Iran Planas     CHIEF COMPLIANT: Follow-up of left breast cancer on anastrozole  INTERVAL HISTORY: Misty Lynch is a 64 y.o. with above-mentioned history of left breast cancer treated with bilateral mastectomies with reconstruction and who is currently on antiestrogen therapy with anastrozole. She presents to the clinic today for annual follow-up   ALLERGIES:  is allergic to tetracyclines & related.  MEDICATIONS:   Current Outpatient Medications  Medication Sig Dispense Refill  . amLODipine (NORVASC) 5 MG tablet Take 5 mg by mouth daily.    Marland Kitchen anastrozole (ARIMIDEX) 1 MG tablet TAKE ONE TABLET BY MOUTH DAILY 90 tablet 0  . aspirin EC 81 MG tablet Take 81 mg by mouth daily.    . Calcium-Vitamin D-Vitamin K 500-100-40 MG-UNT-MCG CHEW Chew 1 tablet by mouth 2 (two) times daily with a meal.     . Cholecalciferol (WELLESSE VITAMIN D3) 1000 UNIT/10ML LIQD Take 10 mLs by mouth daily.    Marland Kitchen losartan-hydrochlorothiazide (HYZAAR) 100-25 MG tablet Take 1 tablet by mouth daily.     No current facility-administered medications for this visit.    PHYSICAL EXAMINATION: ECOG PERFORMANCE STATUS: 1 - Symptomatic but completely ambulatory  Vitals:   07/26/20 1023  BP: (!) 146/59  Pulse: (!) 57  Resp: 17  Temp: (!) 97 F (36.1 C)  SpO2: 97%   Filed Weights   07/26/20 1023  Weight: 184 lb 3.2 oz (83.6 kg)    BREAST: Bilateral mastectomies with reconstruction no palpable lumps or nodules of concern.. (exam performed in the presence of a chaperone)  LABORATORY DATA:  I have reviewed the data as listed CMP Latest Ref Rng & Units 03/31/2019 10/27/2018 03/19/2018  Glucose 70 - 99 mg/dL 113(H) 80 109(H)  BUN 8 - 23 mg/dL $Remove'11 13 9  'VFJQLrw$ Creatinine 0.44 - 1.00 mg/dL 0.80 0.79 0.93  Sodium 135 - 145 mmol/L 141 140 138  Potassium 3.5 - 5.1 mmol/L 3.4(L) 3.0(L) 3.8  Chloride 98 - 111 mmol/L 102 101 98(L)  CO2 22 - 32 mmol/L 29 28 29  Calcium 8.9 - 10.3 mg/dL 9.8 9.5 9.3  Total Protein 6.4 - 8.3 g/dL - - -  Total Bilirubin 0.20 - 1.20 mg/dL - - -  Alkaline Phos 40 - 150 U/L - - -  AST 5 - 34 U/L - - -  ALT 0 - 55 U/L - - -    Lab Results  Component Value Date   WBC 7.7 10/27/2018   HGB 13.8 10/27/2018   HCT 42.4 10/27/2018   MCV 89.3 10/27/2018   PLT 288 10/27/2018   NEUTROABS 4.7 10/27/2018    ASSESSMENT & PLAN:  Malignant neoplasm of upper-outer quadrant of left breast in female, estrogen receptor positive  ([place]) 08/23/2017 left mastectomy: LCIS, invasive cancer not identified in the specimen, 0/4 lymph nodes negative, based on the biopsy, 2 foci of invasive lobular cancer larger span 0.7 cm grade 2 ER 40%, PR 90%, HER-2 negative ratio 1.19, Ki-67 2%, T1BN0 stage I A; right mastectomy: Atypical lobular hyperplasia  Current treatment: Adjuvant anastrozole 1 mg daily started 09/02/2017  Anastrozole toxicities: Denies any adverse effects to anastrozole, occasional joint stiffness especially when she sits down for long period of time.  Breast cancer surveillance: 1.Breast exam 07/26/2020: Benign 2.mammogram not indicated since she had bilateral mastectomies: Clinical examination did not reveal any lumps or nodules of concern. 3.Bone density 12/07/2016 osteopenia T score -1.2, she will need a new bone density test  Return to clinic 1 year for follow-up    No orders of the defined types were placed in this encounter.  The patient has a good understanding of the overall plan. she agrees with it. she will call with any problems that may develop before the next visit here.  Total time spent: 20 mins including face to face time and time spent for planning, charting and coordination of care  Nicholas Lose, MD 07/26/2020  I, Cloyde Reams Dorshimer, am acting as scribe for Dr. Nicholas Lose.  I have reviewed the above documentation for accuracy and completeness, and I agree with the above.

## 2020-07-26 ENCOUNTER — Other Ambulatory Visit: Payer: Self-pay

## 2020-07-26 ENCOUNTER — Inpatient Hospital Stay: Payer: Medicare Other | Attending: Hematology and Oncology | Admitting: Hematology and Oncology

## 2020-07-26 DIAGNOSIS — C50412 Malignant neoplasm of upper-outer quadrant of left female breast: Secondary | ICD-10-CM | POA: Insufficient documentation

## 2020-07-26 DIAGNOSIS — Z9013 Acquired absence of bilateral breasts and nipples: Secondary | ICD-10-CM | POA: Insufficient documentation

## 2020-07-26 DIAGNOSIS — Z170 Estrogen receptor positive status [ER+]: Secondary | ICD-10-CM | POA: Diagnosis not present

## 2020-07-26 MED ORDER — ANASTROZOLE 1 MG PO TABS: 1.0000 mg | ORAL_TABLET | Freq: Every day | ORAL | 3 refills | Status: DC

## 2020-07-26 NOTE — Assessment & Plan Note (Signed)
08/23/2017 left mastectomy: LCIS, invasive cancer not identified in the specimen, 0/4 lymph nodes negative, based on the biopsy, 2 foci of invasive lobular cancer larger span 0.7 cm grade 2 ER 40%, PR 90%, HER-2 negative ratio 1.19, Ki-67 2%, T1BN0 stage I A; right mastectomy: Atypical lobular hyperplasia  Current treatment: Adjuvant anastrozole 1 mg daily started 09/02/2017  Anastrozole toxicities: Denies any adverse effects to anastrozole  Limitation of range of motion  Breast cancer surveillance: 1.Breast exam 07/26/2020: Benign 2.mammogram not indicated since she had bilateral mastectomies: Clinical examination did not reveal any lumps or nodules of concern. 3.Bone density 12/07/2016 osteopenia T score -1.2, she will need a new bone density test  Return to clinic 1 year for follow-up

## 2020-08-26 DIAGNOSIS — C50912 Malignant neoplasm of unspecified site of left female breast: Secondary | ICD-10-CM | POA: Diagnosis not present

## 2020-08-26 DIAGNOSIS — Z0000 Encounter for general adult medical examination without abnormal findings: Secondary | ICD-10-CM | POA: Diagnosis not present

## 2020-08-26 DIAGNOSIS — I10 Essential (primary) hypertension: Secondary | ICD-10-CM | POA: Diagnosis not present

## 2020-08-26 DIAGNOSIS — Z23 Encounter for immunization: Secondary | ICD-10-CM | POA: Diagnosis not present

## 2020-08-26 DIAGNOSIS — E559 Vitamin D deficiency, unspecified: Secondary | ICD-10-CM | POA: Diagnosis not present

## 2020-08-26 DIAGNOSIS — F419 Anxiety disorder, unspecified: Secondary | ICD-10-CM | POA: Diagnosis not present

## 2020-08-26 DIAGNOSIS — R7303 Prediabetes: Secondary | ICD-10-CM | POA: Diagnosis not present

## 2020-08-26 DIAGNOSIS — Z6831 Body mass index (BMI) 31.0-31.9, adult: Secondary | ICD-10-CM | POA: Diagnosis not present

## 2020-08-26 DIAGNOSIS — M8580 Other specified disorders of bone density and structure, unspecified site: Secondary | ICD-10-CM | POA: Diagnosis not present

## 2021-01-17 DIAGNOSIS — L299 Pruritus, unspecified: Secondary | ICD-10-CM | POA: Diagnosis not present

## 2021-01-17 DIAGNOSIS — B351 Tinea unguium: Secondary | ICD-10-CM | POA: Diagnosis not present

## 2021-01-18 DIAGNOSIS — Z9013 Acquired absence of bilateral breasts and nipples: Secondary | ICD-10-CM | POA: Diagnosis not present

## 2021-01-18 DIAGNOSIS — Z08 Encounter for follow-up examination after completed treatment for malignant neoplasm: Secondary | ICD-10-CM | POA: Diagnosis not present

## 2021-01-18 DIAGNOSIS — Z853 Personal history of malignant neoplasm of breast: Secondary | ICD-10-CM | POA: Diagnosis not present

## 2021-03-01 DIAGNOSIS — I10 Essential (primary) hypertension: Secondary | ICD-10-CM | POA: Diagnosis not present

## 2021-03-01 DIAGNOSIS — E6609 Other obesity due to excess calories: Secondary | ICD-10-CM | POA: Diagnosis not present

## 2021-03-01 DIAGNOSIS — R7303 Prediabetes: Secondary | ICD-10-CM | POA: Diagnosis not present

## 2021-03-01 DIAGNOSIS — F325 Major depressive disorder, single episode, in full remission: Secondary | ICD-10-CM | POA: Diagnosis not present

## 2021-03-01 DIAGNOSIS — Z6831 Body mass index (BMI) 31.0-31.9, adult: Secondary | ICD-10-CM | POA: Diagnosis not present

## 2021-03-12 DIAGNOSIS — B0089 Other herpesviral infection: Secondary | ICD-10-CM | POA: Diagnosis not present

## 2021-03-12 DIAGNOSIS — R21 Rash and other nonspecific skin eruption: Secondary | ICD-10-CM | POA: Diagnosis not present

## 2021-03-12 DIAGNOSIS — L303 Infective dermatitis: Secondary | ICD-10-CM | POA: Diagnosis not present

## 2021-03-12 DIAGNOSIS — B029 Zoster without complications: Secondary | ICD-10-CM | POA: Diagnosis not present

## 2021-03-12 DIAGNOSIS — I10 Essential (primary) hypertension: Secondary | ICD-10-CM | POA: Diagnosis not present

## 2021-03-12 DIAGNOSIS — Z888 Allergy status to other drugs, medicaments and biological substances status: Secondary | ICD-10-CM | POA: Diagnosis not present

## 2021-03-12 DIAGNOSIS — Z79899 Other long term (current) drug therapy: Secondary | ICD-10-CM | POA: Diagnosis not present

## 2021-03-12 DIAGNOSIS — B0230 Zoster ocular disease, unspecified: Secondary | ICD-10-CM | POA: Diagnosis not present

## 2021-06-08 DIAGNOSIS — U071 COVID-19: Secondary | ICD-10-CM | POA: Diagnosis not present

## 2021-06-09 DIAGNOSIS — E7800 Pure hypercholesterolemia, unspecified: Secondary | ICD-10-CM | POA: Diagnosis not present

## 2021-07-18 DIAGNOSIS — L719 Rosacea, unspecified: Secondary | ICD-10-CM | POA: Diagnosis not present

## 2021-07-25 NOTE — Progress Notes (Signed)
Patient Care Team: Kathyrn Lass, MD as PCP - General (Family Medicine) Excell Seltzer, MD (Inactive) as Consulting Physician (General Surgery) Nicholas Lose, MD as Consulting Physician (Hematology and Oncology) Kyung Rudd, MD as Consulting Physician (Radiation Oncology) Gardenia Phlegm, NP as Nurse Practitioner (Hematology and Oncology)  DIAGNOSIS:    ICD-10-CM   1. Malignant neoplasm of upper-outer quadrant of left breast in female, estrogen receptor positive ([place])  C50.412    Z17.0       SUMMARY OF ONCOLOGIC HISTORY: Oncology History  Malignant neoplasm of upper-outer quadrant of left breast in female, estrogen receptor positive ([place])  06/24/2017 Initial Diagnosis   Left breast biopsy upper outer quadrant: Invasive lobular cancer with LCIS; upper inner quadrant: Invasive lobular cancer with ADH, grade 1-2, ER 40%, PR 90%, Ki-67 2%, HER-2 negative ratio 1.19; tumor size indeterminate but based on the 2 mm calcifications it is currently being staged as T1 1 N0 stage IA, however we suspect that it is inaccurate clinical staging   08/23/2017 Surgery   Left mastectomy: LCIS, invasive cancer not identified in the specimen, 0/4 lymph nodes negative, based on the biopsy, 2 foci of invasive lobular cancer larger span 0.7 cm grade 2 ER 40%, PR 90%, HER-2 negative ratio 1.19, Ki-67 2%, T1BN0 stage I A;  right mastectomy: Atypical lobular hyperplasia   08/2017 -  Anti-estrogen oral therapy   Anastrozole daily   09/17/2017 Surgery   Breast reconstruction surgery by Dr. Iran Planas     CHIEF COMPLIANT: Follow-up of left breast cancer on anastrozole  INTERVAL HISTORY: Misty Lynch is a 65 y.o. with above-mentioned history of left breast cancer treated with bilateral mastectomies with reconstruction and who is currently on antiestrogen therapy with anastrozole. She presents to the clinic today for annual follow-up.   ALLERGIES:  is allergic to tetracyclines &  related.  MEDICATIONS:  Current Outpatient Medications  Medication Sig Dispense Refill   amLODipine (NORVASC) 5 MG tablet Take 5 mg by mouth daily.     anastrozole (ARIMIDEX) 1 MG tablet Take 1 tablet (1 mg total) by mouth daily. 90 tablet 3   aspirin EC 81 MG tablet Take 81 mg by mouth daily.     Calcium-Vitamin D-Vitamin K 500-100-40 MG-UNT-MCG CHEW Chew 1 tablet by mouth 2 (two) times daily with a meal.      Cholecalciferol (WELLESSE VITAMIN D3) 1000 UNIT/10ML LIQD Take 10 mLs by mouth daily.     losartan-hydrochlorothiazide (HYZAAR) 100-25 MG tablet Take 1 tablet by mouth daily.     No current facility-administered medications for this visit.    PHYSICAL EXAMINATION: ECOG PERFORMANCE STATUS: 1 - Symptomatic but completely ambulatory  There were no vitals filed for this visit. There were no vitals filed for this visit.  BREAST: No palpable masses or nodules in either right or left breasts. No palpable axillary supraclavicular or infraclavicular adenopathy no breast tenderness or nipple discharge. (exam performed in the presence of a chaperone)  LABORATORY DATA:  I have reviewed the data as listed CMP Latest Ref Rng & Units 03/31/2019 10/27/2018 03/19/2018  Glucose 70 - 99 mg/dL 113(H) 80 109(H)  BUN 8 - 23 mg/dL _0 Creatinine 0.44 - 1.00 mg/dL 0.80 0.79 0.93  Sodium 135 - 145 mmol/L 141 140 138  Potassium 3.5 - 5.1 mmol/L 3.4(L) 3.0(L) 3.8  Chloride 98 - 111 mmol/L 102 101 98(L)  CO2 22 - 32 mmol/L _1 Calcium 8.9 - 10.3 mg/dL 9.8 9.5 9.3  Total  Protein 6.4 - 8.3 g/dL - - -  Total Bilirubin 0.20 - 1.20 mg/dL - - -  Alkaline Phos 40 - 150 U/L - - -  AST 5 - 34 U/L - - -  ALT 0 - 55 U/L - - -    Lab Results  Component Value Date   WBC 7.7 10/27/2018   HGB 13.8 10/27/2018   HCT 42.4 10/27/2018   MCV 89.3 10/27/2018   PLT 288 10/27/2018   NEUTROABS 4.7 10/27/2018    ASSESSMENT & PLAN:  Malignant neoplasm of upper-outer quadrant of left breast in female,  estrogen receptor positive ([place]) 08/23/2017 left mastectomy: LCIS, invasive cancer not identified in the specimen, 0/4 lymph nodes negative, based on the biopsy, 2 foci of invasive lobular cancer larger span 0.7 cm grade 2 ER 40%, PR 90%, HER-2 negative ratio 1.19, Ki-67 2%, T1BN0 stage I A;  right mastectomy: Atypical lobular hyperplasia   Current treatment: Adjuvant anastrozole 1 mg daily started 09/02/2017   Anastrozole toxicities: Denies any adverse effects to anastrozole, occasional joint stiffness especially when she sits down for long period of time.   Breast cancer surveillance: 1.  Breast exam 07/26/2021: Benign 2. mammogram not indicated since she had bilateral mastectomies: Clinical examination did not reveal any lumps or nodules of concern. 3.  Bone density 12/07/2016 osteopenia T score -1.2, she will need a new bone density test We will recheck another bone density test.  Return to clinic 1 year for follow-up    No orders of the defined types were placed in this encounter.  The patient has a good understanding of the overall plan. she agrees with it. she will call with any problems that may develop before the next visit here.  Total time spent: 20 mins including face to face time and time spent for planning, charting and coordination of care  Rulon Eisenmenger, MD, MPH 07/26/2021  I, Thana Ates, am acting as scribe for Dr. Nicholas Lose.  I have reviewed the above documentation for accuracy and completeness, and I agree with the above.

## 2021-07-26 ENCOUNTER — Other Ambulatory Visit: Payer: Self-pay

## 2021-07-26 ENCOUNTER — Inpatient Hospital Stay: Payer: Medicare Other | Attending: Hematology and Oncology | Admitting: Hematology and Oncology

## 2021-07-26 VITALS — BP 162/74 | HR 64 | Temp 97.6°F | Resp 18 | Ht 64.0 in | Wt 180.9 lb

## 2021-07-26 DIAGNOSIS — Z780 Asymptomatic menopausal state: Secondary | ICD-10-CM

## 2021-07-26 DIAGNOSIS — Z170 Estrogen receptor positive status [ER+]: Secondary | ICD-10-CM | POA: Diagnosis not present

## 2021-07-26 DIAGNOSIS — M8580 Other specified disorders of bone density and structure, unspecified site: Secondary | ICD-10-CM | POA: Insufficient documentation

## 2021-07-26 DIAGNOSIS — Z9013 Acquired absence of bilateral breasts and nipples: Secondary | ICD-10-CM | POA: Insufficient documentation

## 2021-07-26 DIAGNOSIS — C50412 Malignant neoplasm of upper-outer quadrant of left female breast: Secondary | ICD-10-CM

## 2021-07-26 MED ORDER — ANASTROZOLE 1 MG PO TABS: 1.0000 mg | ORAL_TABLET | Freq: Every day | ORAL | 3 refills | Status: DC

## 2021-07-26 NOTE — Assessment & Plan Note (Signed)
08/23/2017 left mastectomy: LCIS, invasive cancer not identified in the specimen, 0/4 lymph nodes negative, based on the biopsy, 2 foci of invasive lobular cancer larger span 0.7 cm grade 2 ER 40%, PR 90%, HER-2 negative ratio 1.19, Ki-67 2%, T1BN0 stage I A; right mastectomy: Atypical lobular hyperplasia  Current treatment: Adjuvant anastrozole 1 mg daily started 09/02/2017  Anastrozole toxicities: Denies any adverse effects to anastrozole, occasional joint stiffness especially when she sits down for long period of time.  Breast cancer surveillance: 1.Breast exam 07/26/2021: Benign 2.mammogram not indicated since she had bilateral mastectomies: Clinical examination did not reveal any lumps or nodules of concern. 3.Bone density 12/07/2016 osteopenia T score -1.2, she will need a new bone density test  Return to clinic 1 year for follow-up

## 2021-08-09 DIAGNOSIS — H9192 Unspecified hearing loss, left ear: Secondary | ICD-10-CM | POA: Diagnosis not present

## 2021-08-09 DIAGNOSIS — H7292 Unspecified perforation of tympanic membrane, left ear: Secondary | ICD-10-CM | POA: Diagnosis not present

## 2021-08-09 DIAGNOSIS — R198 Other specified symptoms and signs involving the digestive system and abdomen: Secondary | ICD-10-CM | POA: Diagnosis not present

## 2021-08-17 DIAGNOSIS — H9212 Otorrhea, left ear: Secondary | ICD-10-CM | POA: Diagnosis not present

## 2021-08-17 DIAGNOSIS — L309 Dermatitis, unspecified: Secondary | ICD-10-CM | POA: Diagnosis not present

## 2021-08-17 DIAGNOSIS — H722X2 Other marginal perforations of tympanic membrane, left ear: Secondary | ICD-10-CM | POA: Diagnosis not present

## 2021-08-21 ENCOUNTER — Other Ambulatory Visit (HOSPITAL_BASED_OUTPATIENT_CLINIC_OR_DEPARTMENT_OTHER): Payer: Medicare Other

## 2021-08-28 ENCOUNTER — Ambulatory Visit (HOSPITAL_BASED_OUTPATIENT_CLINIC_OR_DEPARTMENT_OTHER)
Admission: RE | Admit: 2021-08-28 | Discharge: 2021-08-28 | Disposition: A | Discharge status: Home / Self Care | Payer: Medicare Other | Source: Ambulatory Visit | Attending: Hematology and Oncology | Admitting: Hematology and Oncology

## 2021-08-28 ENCOUNTER — Other Ambulatory Visit: Payer: Self-pay

## 2021-08-28 DIAGNOSIS — H61893 Other specified disorders of external ear, bilateral: Secondary | ICD-10-CM | POA: Diagnosis not present

## 2021-08-28 DIAGNOSIS — Z780 Asymptomatic menopausal state: Secondary | ICD-10-CM | POA: Insufficient documentation

## 2021-08-28 DIAGNOSIS — H9212 Otorrhea, left ear: Secondary | ICD-10-CM | POA: Diagnosis not present

## 2021-08-28 DIAGNOSIS — L309 Dermatitis, unspecified: Secondary | ICD-10-CM | POA: Diagnosis not present

## 2021-09-12 DIAGNOSIS — H9192 Unspecified hearing loss, left ear: Secondary | ICD-10-CM | POA: Diagnosis not present

## 2021-09-12 DIAGNOSIS — C50912 Malignant neoplasm of unspecified site of left female breast: Secondary | ICD-10-CM | POA: Diagnosis not present

## 2021-09-12 DIAGNOSIS — F418 Other specified anxiety disorders: Secondary | ICD-10-CM | POA: Diagnosis not present

## 2021-09-12 DIAGNOSIS — M8580 Other specified disorders of bone density and structure, unspecified site: Secondary | ICD-10-CM | POA: Diagnosis not present

## 2021-09-12 DIAGNOSIS — Z0000 Encounter for general adult medical examination without abnormal findings: Secondary | ICD-10-CM | POA: Diagnosis not present

## 2021-09-12 DIAGNOSIS — I10 Essential (primary) hypertension: Secondary | ICD-10-CM | POA: Diagnosis not present

## 2021-09-12 DIAGNOSIS — I6529 Occlusion and stenosis of unspecified carotid artery: Secondary | ICD-10-CM | POA: Diagnosis not present

## 2021-09-12 DIAGNOSIS — R7303 Prediabetes: Secondary | ICD-10-CM | POA: Diagnosis not present

## 2021-09-12 DIAGNOSIS — E559 Vitamin D deficiency, unspecified: Secondary | ICD-10-CM | POA: Diagnosis not present

## 2021-09-12 DIAGNOSIS — Z23 Encounter for immunization: Secondary | ICD-10-CM | POA: Diagnosis not present

## 2021-09-12 DIAGNOSIS — M549 Dorsalgia, unspecified: Secondary | ICD-10-CM | POA: Diagnosis not present

## 2021-09-12 DIAGNOSIS — E7800 Pure hypercholesterolemia, unspecified: Secondary | ICD-10-CM | POA: Diagnosis not present

## 2021-10-05 DIAGNOSIS — H9072 Mixed conductive and sensorineural hearing loss, unilateral, left ear, with unrestricted hearing on the contralateral side: Secondary | ICD-10-CM | POA: Diagnosis not present

## 2021-10-31 DIAGNOSIS — H9072 Mixed conductive and sensorineural hearing loss, unilateral, left ear, with unrestricted hearing on the contralateral side: Secondary | ICD-10-CM | POA: Diagnosis not present

## 2021-10-31 DIAGNOSIS — L309 Dermatitis, unspecified: Secondary | ICD-10-CM | POA: Diagnosis not present

## 2021-10-31 DIAGNOSIS — B369 Superficial mycosis, unspecified: Secondary | ICD-10-CM | POA: Diagnosis not present

## 2021-10-31 DIAGNOSIS — H61893 Other specified disorders of external ear, bilateral: Secondary | ICD-10-CM | POA: Diagnosis not present

## 2021-10-31 DIAGNOSIS — H6242 Otitis externa in other diseases classified elsewhere, left ear: Secondary | ICD-10-CM | POA: Diagnosis not present

## 2021-11-16 DIAGNOSIS — Z9013 Acquired absence of bilateral breasts and nipples: Secondary | ICD-10-CM | POA: Diagnosis not present

## 2021-11-16 DIAGNOSIS — T8549XA Other mechanical complication of breast prosthesis and implant, initial encounter: Secondary | ICD-10-CM | POA: Diagnosis not present

## 2021-11-16 DIAGNOSIS — Z853 Personal history of malignant neoplasm of breast: Secondary | ICD-10-CM | POA: Diagnosis not present

## 2021-11-20 NOTE — H&P (Signed)
Subjective:     Patient ID: Misty Lynch is a 66 y.o. female.   Follow-up   Over 4 years post op bilateral mastectomies. Last seen 12/2020. Returns today reporting left implant deflation approximately 2 weeks ago.   Post op bilateral NSM with TE reconstruction. Developed cellulitis and drainage right chest, and underwent removal ADM and TE on right. Culture MSSA, completed Bactrim. Also experienced nipple necrosis on this side post debridement. Underwent delayed right reconstruction with TE/ADM, course complicated by seroma prior to implant exchange followed by ulceration and exposure implant following implant exchange. Ultimately underwent right LD flap/TE reconstruction followed by implant exchange.   Presented following screening MMG with two areas calcifications left breast, 7.1 cm apart. Biopsy left UOQ with ILC with LCIS. Biopsy left UIQ with ILC with ADH. Both ER/PR+, Her2-. [place] axilla negative.   Final pathology right breast with ALH, flat epithelial atypia, 0/1 LN. Left breast with LCIS, no invasive tumor seen, 0/4 SLN.  On anastrozole.   Prior 38 B, happy with this. 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    Review of Systems  Remainder 12 point review negative    Objective:   Physical Exam  Cardiovascular: Normal rate, regular rhythm and normal heart sounds.  Pulmonary/Chest: Effort normal and breath sounds normal.      Chest:  Right chest soft with upper pole rippling no masses Left chest deflated In supine position no significant lateral movement implant Right implant lower border lower on chest than left    +animation left No masses  Axillae benign     Assessment:     Left breast ca UOQ ER+ ([place]), Left breast ca UIQ ER+ ([facility]) S/p bilateral NSM, dual plane TE/ADM ([facility]) reconstruction S/p removal right TE S/p replacement right TE/ADM (infection) S/p removal TE, placement saline  implants S/p removal right chest implant (exposure) S/p right LD flap, TE placement S/p saline implant exchange right chest Deflation left chest implant    Plan:    Plan removal replacement left chest saline. She has been satisfied with volume and symmetry of volume and plan same implant. Reviewed implants are not permanent devices and may require additional surgery in future. Provided Natrelle Saline implant information and completed physician patient checklist.   She has developed some inferior displacement implant on right. Reviewed this this enlarged capsule for implant likely also contributing to rippling noted. We have previously discussed revision right with capsulorraphy and possible acellular demis. Reviewed this as OP surgery, drain for week if ADM used. She desires to proceed with this.   LEFT Natrelle Smooth Round Moderate Profile 510 ml saline implant. REF [id number], fill volume 530 ml  RIGHT Natrelle Smooth Round Moderate Profile Saline implant placed, 480 ml filled to 490 ml. REF [id number]

## 2021-11-21 DIAGNOSIS — H6240 Otitis externa in other diseases classified elsewhere, unspecified ear: Secondary | ICD-10-CM | POA: Diagnosis not present

## 2021-11-21 DIAGNOSIS — B369 Superficial mycosis, unspecified: Secondary | ICD-10-CM | POA: Diagnosis not present

## 2021-11-21 DIAGNOSIS — L309 Dermatitis, unspecified: Secondary | ICD-10-CM | POA: Diagnosis not present

## 2021-11-23 ENCOUNTER — Encounter (HOSPITAL_BASED_OUTPATIENT_CLINIC_OR_DEPARTMENT_OTHER): Payer: Self-pay | Admitting: Plastic Surgery

## 2021-11-27 ENCOUNTER — Encounter (HOSPITAL_BASED_OUTPATIENT_CLINIC_OR_DEPARTMENT_OTHER)
Admission: RE | Admit: 2021-11-27 | Discharge: 2021-11-27 | Disposition: A | Discharge status: Home / Self Care | Payer: Medicare Other | Source: Ambulatory Visit | Attending: Plastic Surgery | Admitting: Plastic Surgery

## 2021-11-27 DIAGNOSIS — Z853 Personal history of malignant neoplasm of breast: Secondary | ICD-10-CM | POA: Diagnosis not present

## 2021-11-27 DIAGNOSIS — K219 Gastro-esophageal reflux disease without esophagitis: Secondary | ICD-10-CM | POA: Diagnosis not present

## 2021-11-27 DIAGNOSIS — Z8619 Personal history of other infectious and parasitic diseases: Secondary | ICD-10-CM | POA: Diagnosis not present

## 2021-11-27 DIAGNOSIS — X58XXXA Exposure to other specified factors, initial encounter: Secondary | ICD-10-CM | POA: Diagnosis not present

## 2021-11-27 DIAGNOSIS — I10 Essential (primary) hypertension: Secondary | ICD-10-CM | POA: Diagnosis not present

## 2021-11-27 DIAGNOSIS — Z9013 Acquired absence of bilateral breasts and nipples: Secondary | ICD-10-CM | POA: Diagnosis not present

## 2021-11-27 DIAGNOSIS — T8549XA Other mechanical complication of breast prosthesis and implant, initial encounter: Secondary | ICD-10-CM | POA: Diagnosis not present

## 2021-11-27 LAB — BASIC METABOLIC PANEL
Anion gap: 12 (ref 5–15)
BUN: 13 mg/dL (ref 8–23)
CO2: 29 mmol/L (ref 22–32)
Calcium: 9.6 mg/dL (ref 8.9–10.3)
Chloride: 97 mmol/L — ABNORMAL LOW (ref 98–111)
Creatinine, Ser: 0.74 mg/dL (ref 0.44–1.00)
GFR, Estimated: >60 mL/min (ref >60)
Glucose, Bld: 115 mg/dL — ABNORMAL HIGH (ref 70–99)
Potassium: 3.4 mmol/L — ABNORMAL LOW (ref 3.5–5.1)
Sodium: 138 mmol/L (ref 135–145)

## 2021-11-27 NOTE — Progress Notes (Signed)

## 2021-12-01 ENCOUNTER — Other Ambulatory Visit: Payer: Self-pay

## 2021-12-01 ENCOUNTER — Ambulatory Visit (HOSPITAL_BASED_OUTPATIENT_CLINIC_OR_DEPARTMENT_OTHER)
Admission: RE | Admit: 2021-12-01 | Discharge: 2021-12-01 | Disposition: A | Discharge status: Home / Self Care | Payer: Medicare Other | Attending: Plastic Surgery | Admitting: Plastic Surgery

## 2021-12-01 ENCOUNTER — Ambulatory Visit (HOSPITAL_BASED_OUTPATIENT_CLINIC_OR_DEPARTMENT_OTHER): Payer: Medicare Other | Admitting: Anesthesiology

## 2021-12-01 ENCOUNTER — Encounter (HOSPITAL_BASED_OUTPATIENT_CLINIC_OR_DEPARTMENT_OTHER): Payer: Self-pay | Admitting: Plastic Surgery

## 2021-12-01 ENCOUNTER — Encounter (HOSPITAL_BASED_OUTPATIENT_CLINIC_OR_DEPARTMENT_OTHER)
Admission: RE | Disposition: A | Discharge status: Home / Self Care | Payer: Self-pay | Source: Home / Self Care | Attending: Plastic Surgery

## 2021-12-01 DIAGNOSIS — K219 Gastro-esophageal reflux disease without esophagitis: Secondary | ICD-10-CM | POA: Diagnosis not present

## 2021-12-01 DIAGNOSIS — I10 Essential (primary) hypertension: Secondary | ICD-10-CM | POA: Diagnosis not present

## 2021-12-01 DIAGNOSIS — X58XXXA Exposure to other specified factors, initial encounter: Secondary | ICD-10-CM | POA: Insufficient documentation

## 2021-12-01 DIAGNOSIS — Z421 Encounter for breast reconstruction following mastectomy: Secondary | ICD-10-CM | POA: Diagnosis not present

## 2021-12-01 DIAGNOSIS — Z8619 Personal history of other infectious and parasitic diseases: Secondary | ICD-10-CM | POA: Diagnosis not present

## 2021-12-01 DIAGNOSIS — C50412 Malignant neoplasm of upper-outer quadrant of left female breast: Secondary | ICD-10-CM

## 2021-12-01 DIAGNOSIS — Z9013 Acquired absence of bilateral breasts and nipples: Secondary | ICD-10-CM | POA: Insufficient documentation

## 2021-12-01 DIAGNOSIS — T8549XA Other mechanical complication of breast prosthesis and implant, initial encounter: Secondary | ICD-10-CM | POA: Insufficient documentation

## 2021-12-01 DIAGNOSIS — Z853 Personal history of malignant neoplasm of breast: Secondary | ICD-10-CM | POA: Insufficient documentation

## 2021-12-01 HISTORY — PX: BREAST IMPLANT EXCHANGE: SHX6296

## 2021-12-01 HISTORY — PX: BREAST CAPSULECTOMY WITH IMPLANT EXCHANGE: SHX5592

## 2021-12-01 SURGERY — REPLACEMENT, IMPLANT, BREAST
Anesthesia: General | Site: Breast | Laterality: Right

## 2021-12-01 MED ORDER — ONDANSETRON HCL 4 MG/2ML IJ SOLN: 4.0000 mg | Freq: Once | INTRAMUSCULAR | Status: DC | PRN

## 2021-12-01 MED ORDER — PHENYLEPHRINE 40 MCG/ML (10ML) SYRINGE FOR IV PUSH (FOR BLOOD PRESSURE SUPPORT)
PREFILLED_SYRINGE | INTRAVENOUS | Status: AC
  Filled 2021-12-01: qty 10

## 2021-12-01 MED ORDER — FENTANYL CITRATE (PF) 100 MCG/2ML IJ SOLN
INTRAMUSCULAR | Status: AC
  Filled 2021-12-01: qty 2

## 2021-12-01 MED ORDER — FENTANYL CITRATE (PF) 100 MCG/2ML IJ SOLN
25.0000 ug | INTRAMUSCULAR | Status: DC | PRN
  Administered 2021-12-01: 50 ug via INTRAVENOUS

## 2021-12-01 MED ORDER — OXYCODONE HCL 5 MG PO TABS: 5.0000 mg | ORAL_TABLET | Freq: Once | ORAL | Status: DC | PRN

## 2021-12-01 MED ORDER — SULFAMETHOXAZOLE-TRIMETHOPRIM 800-160 MG PO TABS: 1.0000 | ORAL_TABLET | Freq: Two times a day (BID) | ORAL | 0 refills | Status: DC

## 2021-12-01 MED ORDER — ONDANSETRON HCL 4 MG/2ML IJ SOLN
INTRAMUSCULAR | Status: AC
  Filled 2021-12-01: qty 2

## 2021-12-01 MED ORDER — CELECOXIB 200 MG PO CAPS
ORAL_CAPSULE | ORAL | Status: AC
  Filled 2021-12-01: qty 1

## 2021-12-01 MED ORDER — CEFAZOLIN SODIUM-DEXTROSE 2-4 GM/100ML-% IV SOLN
2.0000 g | INTRAVENOUS | Status: AC
  Administered 2021-12-01: 2 g via INTRAVENOUS

## 2021-12-01 MED ORDER — ACETAMINOPHEN 500 MG PO TABS
ORAL_TABLET | ORAL | Status: AC
  Filled 2021-12-01: qty 2

## 2021-12-01 MED ORDER — EPHEDRINE 5 MG/ML INJ
INTRAVENOUS | Status: AC
  Filled 2021-12-01: qty 15

## 2021-12-01 MED ORDER — MIDAZOLAM HCL 5 MG/5ML IJ SOLN
INTRAMUSCULAR | Status: DC | PRN
  Administered 2021-12-01: 2 mg via INTRAVENOUS

## 2021-12-01 MED ORDER — FENTANYL CITRATE (PF) 100 MCG/2ML IJ SOLN
INTRAMUSCULAR | Status: DC | PRN
  Administered 2021-12-01: 25 ug via INTRAVENOUS
  Administered 2021-12-01 (×2): 50 ug via INTRAVENOUS

## 2021-12-01 MED ORDER — PHENYLEPHRINE HCL (PRESSORS) 10 MG/ML IV SOLN
INTRAVENOUS | Status: DC | PRN
  Administered 2021-12-01: 40 ug via INTRAVENOUS

## 2021-12-01 MED ORDER — HYDROCODONE-ACETAMINOPHEN 5-325 MG PO TABS
1.0000 | ORAL_TABLET | Freq: Four times a day (QID) | ORAL | 0 refills | Status: DC | PRN
Start: 2021-12-01 — End: 2022-07-26

## 2021-12-01 MED ORDER — SODIUM CHLORIDE 0.9 % IV SOLN
INTRAVENOUS | Status: AC
  Filled 2021-12-01: qty 10

## 2021-12-01 MED ORDER — CEFAZOLIN SODIUM-DEXTROSE 2-4 GM/100ML-% IV SOLN
INTRAVENOUS | Status: AC
  Filled 2021-12-01: qty 100

## 2021-12-01 MED ORDER — GABAPENTIN 300 MG PO CAPS
ORAL_CAPSULE | ORAL | Status: AC
  Filled 2021-12-01: qty 1

## 2021-12-01 MED ORDER — CHLORHEXIDINE GLUCONATE CLOTH 2 % EX PADS: 6.0000 | MEDICATED_PAD | Freq: Once | CUTANEOUS | Status: DC

## 2021-12-01 MED ORDER — EPHEDRINE SULFATE (PRESSORS) 50 MG/ML IJ SOLN
INTRAMUSCULAR | Status: DC | PRN
Start: 2021-12-01 — End: 2021-12-01
  Administered 2021-12-01 (×2): 10 mg via INTRAVENOUS
  Administered 2021-12-01: 5 mg via INTRAVENOUS
  Administered 2021-12-01: 10 mg via INTRAVENOUS

## 2021-12-01 MED ORDER — CELECOXIB 200 MG PO CAPS
200.0000 mg | ORAL_CAPSULE | ORAL | Status: AC
  Administered 2021-12-01: 200 mg via ORAL

## 2021-12-01 MED ORDER — LACTATED RINGERS IV SOLN: INTRAVENOUS | Status: DC

## 2021-12-01 MED ORDER — ACETAMINOPHEN 500 MG PO TABS
1000.0000 mg | ORAL_TABLET | ORAL | Status: AC
  Administered 2021-12-01: 1000 mg via ORAL

## 2021-12-01 MED ORDER — SODIUM CHLORIDE 0.9 % IV SOLN
INTRAVENOUS | Status: DC | PRN
  Administered 2021-12-01: 500 mL

## 2021-12-01 MED ORDER — ACETAMINOPHEN 160 MG/5ML PO SOLN: 325.0000 mg | ORAL | Status: DC | PRN

## 2021-12-01 MED ORDER — LIDOCAINE HCL (CARDIAC) PF 100 MG/5ML IV SOSY
PREFILLED_SYRINGE | INTRAVENOUS | Status: DC | PRN
  Administered 2021-12-01: 40 mg via INTRAVENOUS

## 2021-12-01 MED ORDER — MIDAZOLAM HCL 2 MG/2ML IJ SOLN
INTRAMUSCULAR | Status: AC
  Filled 2021-12-01: qty 2

## 2021-12-01 MED ORDER — GABAPENTIN 300 MG PO CAPS
300.0000 mg | ORAL_CAPSULE | ORAL | Status: AC
  Administered 2021-12-01: 300 mg via ORAL

## 2021-12-01 MED ORDER — DEXAMETHASONE SODIUM PHOSPHATE 4 MG/ML IJ SOLN
INTRAMUSCULAR | Status: DC | PRN
  Administered 2021-12-01: 5 mg via INTRAVENOUS

## 2021-12-01 MED ORDER — LIDOCAINE 2% (20 MG/ML) 5 ML SYRINGE
INTRAMUSCULAR | Status: AC
  Filled 2021-12-01: qty 5

## 2021-12-01 MED ORDER — PROPOFOL 10 MG/ML IV BOLUS
INTRAVENOUS | Status: DC | PRN
  Administered 2021-12-01: 20 mg via INTRAVENOUS
  Administered 2021-12-01: 250 mg via INTRAVENOUS

## 2021-12-01 MED ORDER — MEPERIDINE HCL 25 MG/ML IJ SOLN: 6.2500 mg | INTRAMUSCULAR | Status: DC | PRN

## 2021-12-01 MED ORDER — ACETAMINOPHEN 325 MG PO TABS: 325.0000 mg | ORAL_TABLET | ORAL | Status: DC | PRN

## 2021-12-01 MED ORDER — DEXAMETHASONE SODIUM PHOSPHATE 10 MG/ML IJ SOLN
INTRAMUSCULAR | Status: AC
  Filled 2021-12-01: qty 1

## 2021-12-01 MED ORDER — BUPIVACAINE-EPINEPHRINE 0.25% -1:200000 IJ SOLN
INTRAMUSCULAR | Status: DC | PRN
  Administered 2021-12-01: 30 mL

## 2021-12-01 MED ORDER — OXYCODONE HCL 5 MG/5ML PO SOLN: 5.0000 mg | Freq: Once | ORAL | Status: DC | PRN

## 2021-12-01 SURGICAL SUPPLY — 63 items
ADH SKN CLS APL DERMABOND .7 (GAUZE/BANDAGES/DRESSINGS) ×2
APL PRP STRL LF DISP 70% ISPRP (MISCELLANEOUS) ×4
BAG DECANTER FOR FLEXI CONT (MISCELLANEOUS) ×3 IMPLANT
BINDER BREAST XXLRG (GAUZE/BANDAGES/DRESSINGS) ×1 IMPLANT
BLADE SURG 10 STRL SS (BLADE) ×3 IMPLANT
BLADE SURG 15 STRL LF DISP TIS (BLADE) IMPLANT
BLADE SURG 15 STRL SS (BLADE) ×3
BNDG GAUZE ELAST 4 BULKY (GAUZE/BANDAGES/DRESSINGS) ×6 IMPLANT
CANISTER SUCT 1200ML W/VALVE (MISCELLANEOUS) ×3 IMPLANT
CHLORAPREP W/TINT 26 (MISCELLANEOUS) ×4 IMPLANT
COVER BACK TABLE 60X90IN (DRAPES) ×3 IMPLANT
COVER MAYO STAND STRL (DRAPES) ×3 IMPLANT
DERMABOND ADVANCED (GAUZE/BANDAGES/DRESSINGS) ×1
DERMABOND ADVANCED .7 DNX12 (GAUZE/BANDAGES/DRESSINGS) ×2 IMPLANT
DRAIN CHANNEL 15F RND FF W/TCR (WOUND CARE) ×4 IMPLANT
DRAPE TOP ARMCOVERS (MISCELLANEOUS) ×3 IMPLANT
DRAPE U-SHAPE 76X120 STRL (DRAPES) ×3 IMPLANT
DRAPE UTILITY XL STRL (DRAPES) ×3 IMPLANT
DRSG PAD ABDOMINAL 8X10 ST (GAUZE/BANDAGES/DRESSINGS) ×6 IMPLANT
ELECT BLADE 4.0 EZ CLEAN MEGAD (MISCELLANEOUS) ×3
ELECT COATED BLADE 2.86 ST (ELECTRODE) ×3 IMPLANT
ELECT REM PT RETURN 9FT ADLT (ELECTROSURGICAL) ×3
ELECTRODE BLDE 4.0 EZ CLN MEGD (MISCELLANEOUS) ×2 IMPLANT
ELECTRODE REM PT RTRN 9FT ADLT (ELECTROSURGICAL) ×2 IMPLANT
EVACUATOR SILICONE 100CC (DRAIN) ×3 IMPLANT
GLOVE SURG HYDRASOFT LTX SZ5.5 (GLOVE) ×3 IMPLANT
GLOVE SURG POLYISO LF SZ6.5 (GLOVE) ×1 IMPLANT
GLOVE SURG UNDER POLY LF SZ6.5 (GLOVE) ×1 IMPLANT
GOWN STRL REUS W/ TWL LRG LVL3 (GOWN DISPOSABLE) ×4 IMPLANT
GOWN STRL REUS W/ TWL XL LVL3 (GOWN DISPOSABLE) IMPLANT
GOWN STRL REUS W/TWL LRG LVL3 (GOWN DISPOSABLE) ×3
GOWN STRL REUS W/TWL XL LVL3 (GOWN DISPOSABLE) ×3
IMPL BREAST SALINE 480CC (Breast) IMPLANT
IMPL BREAST SALINE 510CC (Breast) IMPLANT
IMPLANT BREAST SALINE 480CC (Breast) ×3 IMPLANT
IMPLANT BREAST SALINE 510CC (Breast) ×3 IMPLANT
IV NS 500ML (IV SOLUTION) ×3
IV NS 500ML BAXH (IV SOLUTION) ×2 IMPLANT
KIT FILL SYSTEM UNIVERSAL (SET/KITS/TRAYS/PACK) ×1 IMPLANT
NDL FILTER BLUNT 18X1 1/2 (NEEDLE) IMPLANT
NDL HYPO 25X1 1.5 SAFETY (NEEDLE) IMPLANT
NEEDLE FILTER BLUNT 18X 1/2SAF (NEEDLE) ×1
NEEDLE FILTER BLUNT 18X1 1/2 (NEEDLE) ×2 IMPLANT
NEEDLE HYPO 25X1 1.5 SAFETY (NEEDLE) ×3 IMPLANT
PACK BASIN DAY SURGERY FS (CUSTOM PROCEDURE TRAY) ×3 IMPLANT
PENCIL SMOKE EVACUATOR (MISCELLANEOUS) ×3 IMPLANT
PIN SAFETY STERILE (MISCELLANEOUS) ×3 IMPLANT
SHEET MEDIUM DRAPE 40X70 STRL (DRAPES) ×5 IMPLANT
SLEEVE SCD COMPRESS KNEE MED (STOCKING) ×3 IMPLANT
SPONGE T-LAP 18X18 ~~LOC~~+RFID (SPONGE) ×6 IMPLANT
STAPLER VISISTAT 35W (STAPLE) ×3 IMPLANT
SUT ETHIBOND 0 MO6 C/R (SUTURE) ×1 IMPLANT
SUT MNCRL AB 4-0 PS2 18 (SUTURE) ×1 IMPLANT
SUT PDS AB 2-0 CT2 27 (SUTURE) ×1 IMPLANT
SUT VIC AB 3-0 SH 27 (SUTURE) ×3
SUT VIC AB 3-0 SH 27X BRD (SUTURE) IMPLANT
SUT VICRYL 4-0 PS2 18IN ABS (SUTURE) ×2 IMPLANT
SYR BULB IRRIG 60ML STRL (SYRINGE) ×5 IMPLANT
SYR CONTROL 10ML LL (SYRINGE) ×1 IMPLANT
TOWEL GREEN STERILE FF (TOWEL DISPOSABLE) ×6 IMPLANT
TUBE CONNECTING 20X1/4 (TUBING) ×3 IMPLANT
UNDERPAD 30X36 HEAVY ABSORB (UNDERPADS AND DIAPERS) ×6 IMPLANT
YANKAUER SUCT BULB TIP NO VENT (SUCTIONS) ×3 IMPLANT

## 2021-12-01 NOTE — Interval H&P Note (Signed)
History and Physical Interval Note:  12/01/2021 9:14 AM  Misty Lynch  has presented today for surgery, with the diagnosis of history breast cancer acquired absence breasts mechanical complication breast implant.  The various methods of treatment have been discussed with the patient and family. After consideration of risks, benefits and other options for treatment, the patient has consented to  Procedure(s): removal replacement left breast saline implant (Left) possible revision right reconstruction with capsulorraphy saline implant exchange (Right) as a surgical intervention.  The patient's history has been reviewed, patient examined, no change in status, stable for surgery.  I have reviewed the patient's chart and labs.  Questions were answered to the patient's satisfaction.     Arnoldo Hooker Essica Kiker

## 2021-12-01 NOTE — Op Note (Signed)
Operative Note   DATE OF OPERATION: 2.3.23  LOCATION: [facility]-outpatient  SURGICAL DIVISION: Plastic Surgery  PREOPERATIVE DIAGNOSES:  1. History breast caner 2. Acquired absence breasts 3. Mechanical complication left breast implant  POSTOPERATIVE DIAGNOSES:  same  PROCEDURE:  1. Revision bilateral breast reconstruction with saline implant exchange 2. Right capsulorraphy  SURGEON: [name] MD MBA  ASSISTANT: none  ANESTHESIA:  General.   EBL: 30 ml  COMPLICATIONS: None immediate.   INDICATIONS FOR PROCEDURE:  The patient, Misty Lynch, is a 66 y.o. female born on August 26, 1956, is here for revision breast reconstruction following left implant deflation. Patient has had asymmetry implant position with right lower and plan revision.   FINDINGS: Removed deflated smooth round saline implant left chest. Removed intact smooth round saline implant right chest. Placed Natrelle Smooth Round Moderate Profile implants in each chest. Over right 480 ml implant filled to 490 ml, REF 68-480 SN [id number]. Over left chest, 510 ml implant filled to 530 ml. REF 68/510 SN [id number]  DESCRIPTION OF PROCEDURE:  The patient's operative site was marked with the patient in the preoperative area. The patient was taken to the operating room. SCDs were placed and IV antibiotics were given. The patient's operative site was prepped and draped in a sterile fashion. A time out was performed and all information was confirmed to be correct. Incision made in left inframammary fold scar and carried to implant capsule. Implant shell removed. Capsulotomies performed superiorly and medially. Cavity irrigated with saline solution containing Ancef gentamicin and Betadine. Hemostasis ensured. Local anesthetic infiltrated. Implant prepared and placed in cavity. Implanted filled to final fill volume 530 ml. Fill tubing removed and tab closure ensured. Closure completed in layers with 3-0 vicryl in superficial  fascia, 4-0 vicryl in dermis, and 4-0 monocryl subcuticular skin closure.  I then directed attention to right chest. Incision made in right inframammary fold scar and carried to implant capsule. Implant removed. Thermal capsulorraphy performed over inframammary fold. Interrupted 0 Ethibond sutures from anterior capsule at desired inframammary fold to chest wall placed. Addition 2-0 PDS suture used to stabilize superficial fascia caudal to incision to chest wall. Cavity irrigated with saline solution containing Ancef gentamicin and Betadine. Hemostasis ensured. Local anesthetic infiltrated. Implant prepared and placed in cavity. Implanted filled to final fill volume 490 ml. Fill tubing removed and tab closure ensured. Closure completed in layers with 3-0 vicryl in superficial fascia, 4-0 vicryl in dermis, and 4-0 monocryl subcuticular skin closure. Dermabond applied followed by dry dressing and breast binder.  The patient was allowed to wake from anesthesia, extubated and taken to the recovery room in satisfactory condition.   SPECIMENS: none  DRAINS: none

## 2021-12-01 NOTE — Anesthesia Postprocedure Evaluation (Signed)
Anesthesia Post Note  Patient: Misty Lynch  Procedure(s) Performed: removal replacement left breast saline implant (Left: Breast) revision right reconstruction with capsulorraphy saline implant exchange (Right: Breast)     Patient location during evaluation: PACU Anesthesia Type: General Level of consciousness: awake and alert Pain management: pain level controlled Vital Signs Assessment: post-procedure vital signs reviewed and stable Respiratory status: spontaneous breathing, nonlabored ventilation, respiratory function stable and patient connected to nasal cannula oxygen Cardiovascular status: blood pressure returned to baseline and stable Postop Assessment: no apparent nausea or vomiting Anesthetic complications: no   No notable events documented.  Last Vitals:  Vitals:   12/01/21 1300 12/01/21 1318  BP: (!) 156/65 (!) 159/66  Pulse: 87 86  Resp: 16 15  Temp:  36.5 C  SpO2: 93% 95%    Last Pain:  Vitals:   12/01/21 1318  TempSrc: Oral  PainSc: 2                  Effie Berkshire

## 2021-12-01 NOTE — Anesthesia Preprocedure Evaluation (Addendum)
Anesthesia Evaluation  Patient identified by MRN, date of birth, ID band Patient awake    Reviewed: Allergy & Precautions, H&P , NPO status , Patient's Chart, lab work & pertinent test results  Airway Mallampati: III  TM Distance: >3 FB     Dental  (+) Dental Advisory Given, Teeth Intact   Pulmonary neg pulmonary ROS,    breath sounds clear to auscultation       Cardiovascular hypertension, Pt. on medications  Rhythm:Regular Rate:Normal     Neuro/Psych negative neurological ROS     GI/Hepatic Neg liver ROS, GERD  ,  Endo/Other  negative endocrine ROS  Renal/GU negative Renal ROS     Musculoskeletal   Abdominal   Peds  Hematology negative hematology ROS (+)   Anesthesia Other Findings   Reproductive/Obstetrics                            Lab Results  Component Value Date   WBC 7.7 10/27/2018   HGB 13.8 10/27/2018   HCT 42.4 10/27/2018   MCV 89.3 10/27/2018   PLT 288 10/27/2018   Lab Results  Component Value Date   CREATININE 0.74 11/27/2021   BUN 13 11/27/2021   NA 138 11/27/2021   K 3.4 (L) 11/27/2021   CL 97 (L) 11/27/2021   CO2 29 11/27/2021    Anesthesia Physical  Anesthesia Plan  ASA: 2  Anesthesia Plan: General   Post-op Pain Management:    Induction: Intravenous  PONV Risk Score and Plan: 3 and Midazolam, Dexamethasone, Ondansetron and Treatment may vary due to age or medical condition  Airway Management Planned: Oral ETT and LMA  Additional Equipment:   Intra-op Plan:   Post-operative Plan: Extubation in OR  Informed Consent: I have reviewed the patients History and Physical, chart, labs and discussed the procedure including the risks, benefits and alternatives for the proposed anesthesia with the patient or authorized representative who has indicated his/her understanding and acceptance.     Dental advisory given  Plan Discussed with: CRNA and  Anesthesiologist  Anesthesia Plan Comments:         Anesthesia Quick Evaluation

## 2021-12-01 NOTE — Transfer of Care (Signed)
Immediate Anesthesia Transfer of Care Note  Patient: Misty Lynch  Procedure(s) Performed: removal replacement left breast saline implant (Left: Breast) revision right reconstruction with capsulorraphy saline implant exchange (Right: Breast)  Patient Location: PACU  Anesthesia Type:General  Level of Consciousness: awake, alert  and oriented  Airway & Oxygen Therapy: Patient Spontanous Breathing and Patient connected to face mask oxygen  Post-op Assessment: Report given to RN and Post -op Vital signs reviewed and stable  Post vital signs: Reviewed and stable  Last Vitals:  Vitals Value Taken Time  BP 163/69 12/01/21 1233  Temp 36.5 C 12/01/21 1233  Pulse 95 12/01/21 1236  Resp 27 12/01/21 1236  SpO2 100 % 12/01/21 1236  Vitals shown include unvalidated device data.  Last Pain:  Vitals:   12/01/21 0928  TempSrc: Oral  PainSc: 0-No pain      Patients Stated Pain Goal: 4 (59/97/74 1423)  Complications: No notable events documented.

## 2021-12-01 NOTE — Discharge Instructions (Addendum)
°  Post Anesthesia Home Care Instructions  Activity: Get plenty of rest for the remainder of the day. A responsible individual must stay with you for 24 hours following the procedure.  For the next 24 hours, DO NOT: -Drive a car -Paediatric nurse -Drink alcoholic beverages -Take any medication unless instructed by your physician -Make any legal decisions or sign important papers.  Meals: Start with liquid foods such as gelatin or soup. Progress to regular foods as tolerated. Avoid greasy, spicy, heavy foods. If nausea and/or vomiting occur, drink only clear liquids until the nausea and/or vomiting subsides. Call your physician if vomiting continues.  Special Instructions/Symptoms: Your throat may feel dry or sore from the anesthesia or the breathing tube placed in your throat during surgery. If this causes discomfort, gargle with warm salt water. The discomfort should disappear within 24 hours.  If you had a scopolamine patch placed behind your ear for the management of post- operative nausea and/or vomiting:  1. The medication in the patch is effective for 72 hours, after which it should be removed.  Wrap patch in a tissue and discard in the trash. Wash hands thoroughly with soap and water. 2. You may remove the patch earlier than 72 hours if you experience unpleasant side effects which may include dry mouth, dizziness or visual disturbances. 3. Avoid touching the patch. Wash your hands with soap and water after contact with the patch.        Next dose of Tylenol can be given after 3:30 PM. Next dose of NSAID (Ibuprofen, Motrin, Aleve) can be given after 3:30 PM.

## 2021-12-01 NOTE — Anesthesia Procedure Notes (Signed)
Procedure Name: LMA Insertion Date/Time: 12/01/2021 10:36 AM Performed by: Maryella Shivers, CRNA Pre-anesthesia Checklist: Patient identified, Emergency Drugs available, Suction available and Patient being monitored Patient Re-evaluated:Patient Re-evaluated prior to induction Oxygen Delivery Method: Circle system utilized Preoxygenation: Pre-oxygenation with 100% oxygen Induction Type: IV induction Ventilation: Mask ventilation without difficulty LMA: LMA inserted LMA Size: 4.0 Number of attempts: 1 Airway Equipment and Method: Bite block Placement Confirmation: positive ETCO2 Tube secured with: Tape Dental Injury: Teeth and Oropharynx as per pre-operative assessment

## 2021-12-04 ENCOUNTER — Encounter (HOSPITAL_BASED_OUTPATIENT_CLINIC_OR_DEPARTMENT_OTHER): Payer: Self-pay | Admitting: Plastic Surgery

## 2021-12-28 ENCOUNTER — Other Ambulatory Visit: Payer: Self-pay

## 2021-12-28 ENCOUNTER — Encounter (HOSPITAL_BASED_OUTPATIENT_CLINIC_OR_DEPARTMENT_OTHER): Payer: Self-pay | Admitting: Plastic Surgery

## 2021-12-28 NOTE — H&P (Signed)
Subjective:  ?  ? Patient ID: Misty Lynch is a 66 y.o. female. ?  ?Follow-up ?  ?4 weeks post op. Developed onset fever at 3 weeks post op with left sided pain. Completed Bactrim yesterday. Following last visit continued to have fever for 2-3 days then none further. Reports onset drainage 3 days ago from left side ?  ?Post op bilateral NSM with TE reconstruction. Developed cellulitis and drainage right chest, and underwent removal ADM and TE on right. Culture MSSA, completed Bactrim. Also experienced nipple necrosis on this side post debridement. Underwent delayed right reconstruction with TE/ADM, course complicated by seroma prior to implant exchange followed by ulceration and exposure implant following implant exchange. Ultimately underwent right LD flap/TE reconstruction followed by implant exchange. ?  ?Presented following screening MMG with two areas calcifications left breast, 7.1 cm apart. Biopsy left UOQ with ILC with LCIS. Biopsy left UIQ with ILC with ADH. Both ER/PR+, Her2-. [place] axilla negative. ?  ?Final pathology right breast with ALH, flat epithelial atypia, 0/1 LN. Left breast with LCIS, no invasive tumor seen, 0/4 SLN.  On anastrozole. ?  ?Prior 10 B, happy with this. Right mastectomy 786 g Left 676 g ?  ?Retired from Research scientist (physical sciences). Husband is Chief Strategy Officer.  ?  ?Review of Systems ?  ?   ?Objective:  ? Physical Exam  ?Cardiovascular: Normal rate, regular rhythm and normal heart sounds.  ?Pulmonary/Chest: Effort normal and breath sounds normal.  ?  ?  ?Chest:  Right chest incision intact soft ?Left IMF with < 1cm opening and implant protruding from this, no cellulitis, scant drainage ?  ?Assessment:  ?   ?Left breast ca UOQ ER+ ([facility]), Left breast ca UIQ ER+ ([place]) ?S/p bilateral NSM, dual plane TE/ADM ([facility]) reconstruction ?S/p removal right TE ?S/p replacement right TE/ADM (infection) ?S/p removal TE, placement saline implants ?S/p removal right chest implant  (exposure) ?S/p right LD flap, TE placement ?S/p saline implant exchange right chest ?Deflation left chest implant s/p revision bilateral with saline implant exchange, right capsulorraphy ?Left implant extrusion ?   ?Plan:  ?  ?Exposed left implant. Recommend removal. I am reluctant to replace this implant as she has likely had infection over last week leading to extrusion and drainage. Will continue antibiotics, may need to adjust with any culture. Recommend 10-12 weeks prior to any additional procedures in order to clear infection. Plan surgery in am, reviewed OP surgery, drain placement. ?  ?Moving forward, reviewed options with patient do nothing, remove right side implant and extra skin bilateral, or placement TE vs implant. The soft tissue will contract once implant out and may not be able to go straight to implant. If expander necessary this would mean additional surgery for implant placement.  They will consider. ?  ?  ?LEFT Natrelle Smooth Round Moderate Profile 510 ml saline implant. REF [id number], fill volume 530 ml  ?RIGHT Natrelle Smooth Round Moderate Profile Saline implant placed, 480 ml filled to 490 ml. REF [id number]  ?  ? ?

## 2021-12-29 ENCOUNTER — Other Ambulatory Visit: Payer: Self-pay

## 2021-12-29 ENCOUNTER — Ambulatory Visit (HOSPITAL_BASED_OUTPATIENT_CLINIC_OR_DEPARTMENT_OTHER)
Admission: RE | Admit: 2021-12-29 | Discharge: 2021-12-29 | Disposition: A | Discharge status: Home / Self Care | Payer: Medicare Other | Attending: Plastic Surgery | Admitting: Plastic Surgery

## 2021-12-29 ENCOUNTER — Ambulatory Visit (HOSPITAL_BASED_OUTPATIENT_CLINIC_OR_DEPARTMENT_OTHER): Payer: Medicare Other | Admitting: Anesthesiology

## 2021-12-29 ENCOUNTER — Encounter (HOSPITAL_BASED_OUTPATIENT_CLINIC_OR_DEPARTMENT_OTHER)
Admission: RE | Disposition: A | Discharge status: Home / Self Care | Payer: Self-pay | Source: Home / Self Care | Attending: Plastic Surgery

## 2021-12-29 ENCOUNTER — Encounter (HOSPITAL_BASED_OUTPATIENT_CLINIC_OR_DEPARTMENT_OTHER): Payer: Self-pay | Admitting: Plastic Surgery

## 2021-12-29 DIAGNOSIS — T85898A Other specified complication of other internal prosthetic devices, implants and grafts, initial encounter: Secondary | ICD-10-CM | POA: Diagnosis not present

## 2021-12-29 DIAGNOSIS — T8541XA Breakdown (mechanical) of breast prosthesis and implant, initial encounter: Secondary | ICD-10-CM | POA: Diagnosis not present

## 2021-12-29 DIAGNOSIS — Z853 Personal history of malignant neoplasm of breast: Secondary | ICD-10-CM | POA: Diagnosis not present

## 2021-12-29 DIAGNOSIS — T8549XA Other mechanical complication of breast prosthesis and implant, initial encounter: Secondary | ICD-10-CM

## 2021-12-29 DIAGNOSIS — Z170 Estrogen receptor positive status [ER+]: Secondary | ICD-10-CM

## 2021-12-29 DIAGNOSIS — Z79899 Other long term (current) drug therapy: Secondary | ICD-10-CM | POA: Insufficient documentation

## 2021-12-29 DIAGNOSIS — Z45812 Encounter for adjustment or removal of left breast implant: Secondary | ICD-10-CM | POA: Diagnosis not present

## 2021-12-29 DIAGNOSIS — I10 Essential (primary) hypertension: Secondary | ICD-10-CM | POA: Diagnosis not present

## 2021-12-29 DIAGNOSIS — X58XXXA Exposure to other specified factors, initial encounter: Secondary | ICD-10-CM | POA: Diagnosis not present

## 2021-12-29 DIAGNOSIS — C50412 Malignant neoplasm of upper-outer quadrant of left female breast: Secondary | ICD-10-CM

## 2021-12-29 DIAGNOSIS — Z9013 Acquired absence of bilateral breasts and nipples: Secondary | ICD-10-CM | POA: Diagnosis not present

## 2021-12-29 HISTORY — PX: BREAST IMPLANT REMOVAL: SHX5361

## 2021-12-29 LAB — BASIC METABOLIC PANEL
Anion gap: 8 (ref 5–15)
BUN: 12 mg/dL (ref 8–23)
CO2: 29 mmol/L (ref 22–32)
Calcium: 9.5 mg/dL (ref 8.9–10.3)
Chloride: 100 mmol/L (ref 98–111)
Creatinine, Ser: 1.02 mg/dL — ABNORMAL HIGH (ref 0.44–1.00)
GFR, Estimated: 60 mL/min — ABNORMAL LOW (ref >60)
Glucose, Bld: 108 mg/dL — ABNORMAL HIGH (ref 70–99)
Potassium: 3.3 mmol/L — ABNORMAL LOW (ref 3.5–5.1)
Sodium: 137 mmol/L (ref 135–145)

## 2021-12-29 SURGERY — REMOVAL, IMPLANT, BREAST
Anesthesia: General | Site: Chest | Laterality: Left

## 2021-12-29 MED ORDER — DEXAMETHASONE SODIUM PHOSPHATE 4 MG/ML IJ SOLN
INTRAMUSCULAR | Status: DC | PRN
Start: 2021-12-29 — End: 2021-12-29
  Administered 2021-12-29: 10 mg via INTRAVENOUS

## 2021-12-29 MED ORDER — SODIUM CHLORIDE 0.9 % IV SOLN
INTRAVENOUS | Status: DC | PRN
  Administered 2021-12-29: 500 mL

## 2021-12-29 MED ORDER — DROPERIDOL 2.5 MG/ML IJ SOLN
INTRAMUSCULAR | Status: DC | PRN
  Administered 2021-12-29: .625 mg via INTRAVENOUS

## 2021-12-29 MED ORDER — SODIUM CHLORIDE 0.9 % IV SOLN
INTRAVENOUS | Status: AC
  Filled 2021-12-29: qty 10

## 2021-12-29 MED ORDER — DEXAMETHASONE SODIUM PHOSPHATE 10 MG/ML IJ SOLN
INTRAMUSCULAR | Status: AC
  Filled 2021-12-29: qty 1

## 2021-12-29 MED ORDER — LACTATED RINGERS IV SOLN: INTRAVENOUS | Status: DC

## 2021-12-29 MED ORDER — BUPIVACAINE-EPINEPHRINE 0.25% -1:200000 IJ SOLN
INTRAMUSCULAR | Status: DC | PRN
  Administered 2021-12-29: 15 mL

## 2021-12-29 MED ORDER — MIDAZOLAM HCL 5 MG/5ML IJ SOLN
INTRAMUSCULAR | Status: DC | PRN
  Administered 2021-12-29: 2 mg via INTRAVENOUS

## 2021-12-29 MED ORDER — FENTANYL CITRATE (PF) 100 MCG/2ML IJ SOLN
INTRAMUSCULAR | Status: DC | PRN
  Administered 2021-12-29 (×2): 50 ug via INTRAVENOUS

## 2021-12-29 MED ORDER — GABAPENTIN 300 MG PO CAPS
ORAL_CAPSULE | ORAL | Status: AC
  Filled 2021-12-29: qty 1

## 2021-12-29 MED ORDER — ACETAMINOPHEN 500 MG PO TABS
ORAL_TABLET | ORAL | Status: AC
  Filled 2021-12-29: qty 2

## 2021-12-29 MED ORDER — ONDANSETRON HCL 4 MG/2ML IJ SOLN: 4.0000 mg | Freq: Once | INTRAMUSCULAR | Status: DC | PRN

## 2021-12-29 MED ORDER — PROPOFOL 10 MG/ML IV BOLUS
INTRAVENOUS | Status: DC | PRN
  Administered 2021-12-29: 30 mg via INTRAVENOUS
  Administered 2021-12-29: 170 mg via INTRAVENOUS

## 2021-12-29 MED ORDER — BUPIVACAINE-EPINEPHRINE (PF) 0.25% -1:200000 IJ SOLN
INTRAMUSCULAR | Status: AC
  Filled 2021-12-29: qty 60

## 2021-12-29 MED ORDER — ONDANSETRON HCL 4 MG/2ML IJ SOLN
INTRAMUSCULAR | Status: AC
  Filled 2021-12-29: qty 2

## 2021-12-29 MED ORDER — OXYCODONE HCL 5 MG PO TABS: 5.0000 mg | ORAL_TABLET | Freq: Once | ORAL | Status: DC | PRN

## 2021-12-29 MED ORDER — MIDAZOLAM HCL 2 MG/2ML IJ SOLN
INTRAMUSCULAR | Status: AC
  Filled 2021-12-29: qty 2

## 2021-12-29 MED ORDER — EPHEDRINE SULFATE-NACL 50-0.9 MG/10ML-% IV SOSY
PREFILLED_SYRINGE | INTRAVENOUS | Status: DC | PRN
  Administered 2021-12-29: 5 mg via INTRAVENOUS

## 2021-12-29 MED ORDER — FENTANYL CITRATE (PF) 100 MCG/2ML IJ SOLN
INTRAMUSCULAR | Status: AC
  Filled 2021-12-29: qty 2

## 2021-12-29 MED ORDER — DROPERIDOL 2.5 MG/ML IJ SOLN
INTRAMUSCULAR | Status: AC
Start: 2021-12-29 — End: ?
  Filled 2021-12-29: qty 2

## 2021-12-29 MED ORDER — KETOROLAC TROMETHAMINE 30 MG/ML IJ SOLN: 30.0000 mg | Freq: Once | INTRAMUSCULAR | Status: DC | PRN

## 2021-12-29 MED ORDER — LIDOCAINE 2% (20 MG/ML) 5 ML SYRINGE
INTRAMUSCULAR | Status: AC
  Filled 2021-12-29: qty 5

## 2021-12-29 MED ORDER — ACETAMINOPHEN 500 MG PO TABS
1000.0000 mg | ORAL_TABLET | ORAL | Status: AC
  Administered 2021-12-29: 1000 mg via ORAL

## 2021-12-29 MED ORDER — ONDANSETRON HCL 4 MG/2ML IJ SOLN
INTRAMUSCULAR | Status: DC | PRN
  Administered 2021-12-29: 4 mg via INTRAVENOUS

## 2021-12-29 MED ORDER — LIDOCAINE 2% (20 MG/ML) 5 ML SYRINGE
INTRAMUSCULAR | Status: DC | PRN
Start: 2021-12-29 — End: 2021-12-29
  Administered 2021-12-29: 100 mg via INTRAVENOUS

## 2021-12-29 MED ORDER — VANCOMYCIN HCL IN DEXTROSE 1-5 GM/200ML-% IV SOLN
1000.0000 mg | INTRAVENOUS | Status: AC
  Administered 2021-12-29: 1000 mg via INTRAVENOUS

## 2021-12-29 MED ORDER — OXYCODONE HCL 5 MG/5ML PO SOLN: 5.0000 mg | Freq: Once | ORAL | Status: DC | PRN

## 2021-12-29 MED ORDER — GABAPENTIN 300 MG PO CAPS
300.0000 mg | ORAL_CAPSULE | ORAL | Status: AC
  Administered 2021-12-29: 300 mg via ORAL

## 2021-12-29 MED ORDER — FENTANYL CITRATE (PF) 100 MCG/2ML IJ SOLN: 25.0000 ug | INTRAMUSCULAR | Status: DC | PRN

## 2021-12-29 MED ORDER — VANCOMYCIN HCL IN DEXTROSE 1-5 GM/200ML-% IV SOLN
INTRAVENOUS | Status: AC
  Filled 2021-12-29: qty 200

## 2021-12-29 SURGICAL SUPPLY — 43 items
ADH SKN CLS APL DERMABOND .7 (GAUZE/BANDAGES/DRESSINGS) ×1
APL PRP STRL LF DISP 70% ISPRP (MISCELLANEOUS) ×1
BAG DECANTER FOR FLEXI CONT (MISCELLANEOUS) ×2 IMPLANT
BINDER BREAST XLRG (GAUZE/BANDAGES/DRESSINGS) ×1 IMPLANT
BLADE SURG 10 STRL SS (BLADE) ×2 IMPLANT
BLADE SURG 11 STRL SS (BLADE) ×1 IMPLANT
CANISTER SUCT 1200ML W/VALVE (MISCELLANEOUS) ×2 IMPLANT
CHLORAPREP W/TINT 26 (MISCELLANEOUS) ×2 IMPLANT
COVER BACK TABLE 60X90IN (DRAPES) ×2 IMPLANT
COVER MAYO STAND STRL (DRAPES) ×2 IMPLANT
DERMABOND ADVANCED (GAUZE/BANDAGES/DRESSINGS) ×1
DERMABOND ADVANCED .7 DNX12 (GAUZE/BANDAGES/DRESSINGS) ×1 IMPLANT
DRAIN CHANNEL 19F RND (DRAIN) ×1 IMPLANT
DRAPE LAPAROTOMY 100X72 PEDS (DRAPES) ×1 IMPLANT
DRAPE UTILITY XL STRL (DRAPES) ×2 IMPLANT
DRSG PAD ABDOMINAL 8X10 ST (GAUZE/BANDAGES/DRESSINGS) ×2 IMPLANT
ELECT COATED BLADE 2.86 ST (ELECTRODE) ×2 IMPLANT
ELECT REM PT RETURN 9FT ADLT (ELECTROSURGICAL) ×2
ELECTRODE REM PT RTRN 9FT ADLT (ELECTROSURGICAL) ×1 IMPLANT
EVACUATOR SILICONE 100CC (DRAIN) ×1 IMPLANT
GOWN STRL REUS W/ TWL LRG LVL3 (GOWN DISPOSABLE) ×2 IMPLANT
GOWN STRL REUS W/TWL LRG LVL3 (GOWN DISPOSABLE) ×4
NDL HYPO 25X1 1.5 SAFETY (NEEDLE) IMPLANT
NEEDLE HYPO 25X1 1.5 SAFETY (NEEDLE) ×2 IMPLANT
PACK BASIN DAY SURGERY FS (CUSTOM PROCEDURE TRAY) ×2 IMPLANT
PENCIL SMOKE EVACUATOR (MISCELLANEOUS) ×2 IMPLANT
PIN SAFETY STERILE (MISCELLANEOUS) ×1 IMPLANT
SHEET MEDIUM DRAPE 40X70 STRL (DRAPES) ×2 IMPLANT
SLEEVE SCD COMPRESS KNEE MED (STOCKING) ×2 IMPLANT
SPONGE T-LAP 18X18 ~~LOC~~+RFID (SPONGE) ×4 IMPLANT
STAPLER VISISTAT 35W (STAPLE) ×2 IMPLANT
SUT ETHILON 2 0 FS 18 (SUTURE) ×1 IMPLANT
SUT MNCRL AB 4-0 PS2 18 (SUTURE) ×2 IMPLANT
SUT MON AB 3-0 SH 27 (SUTURE) ×2
SUT MON AB 3-0 SH27 (SUTURE) IMPLANT
SWAB COLLECTION DEVICE MRSA (MISCELLANEOUS) ×1 IMPLANT
SWAB CULTURE ESWAB REG 1ML (MISCELLANEOUS) ×1 IMPLANT
SYR BULB IRRIG 60ML STRL (SYRINGE) ×2 IMPLANT
SYR CONTROL 10ML LL (SYRINGE) ×1 IMPLANT
TOWEL GREEN STERILE FF (TOWEL DISPOSABLE) ×4 IMPLANT
TUBE CONNECTING 20X1/4 (TUBING) ×2 IMPLANT
UNDERPAD 30X36 HEAVY ABSORB (UNDERPADS AND DIAPERS) ×3 IMPLANT
YANKAUER SUCT BULB TIP NO VENT (SUCTIONS) ×2 IMPLANT

## 2021-12-29 NOTE — Anesthesia Postprocedure Evaluation (Signed)
Anesthesia Post Note ? ?Patient: Misty Lynch ? ?Procedure(s) Performed: REMOVAL LEFT BREAST IMPLANT (Left: Chest) ? ?  ? ?Patient location during evaluation: PACU ?Anesthesia Type: General ?Level of consciousness: awake and alert ?Pain management: pain level controlled ?Vital Signs Assessment: post-procedure vital signs reviewed and stable ?Respiratory status: spontaneous breathing, nonlabored ventilation, respiratory function stable and patient connected to nasal cannula oxygen ?Cardiovascular status: blood pressure returned to baseline and stable ?Postop Assessment: no apparent nausea or vomiting ?Anesthetic complications: no ? ? ?No notable events documented. ? ?Last Vitals:  ?Vitals:  ? 12/29/21 0900 12/29/21 0912  ?BP: 122/61 (!) 143/67  ?Pulse: 62 65  ?Resp: 11 18  ?Temp:  36.6 ?C  ?SpO2: 92% 94%  ?  ?Last Pain:  ?Vitals:  ? 12/29/21 0912  ?TempSrc:   ?PainSc: 0-No pain  ? ? ?  ?  ?  ?  ?  ?  ? ?Bricelyn Freestone S ? ? ? ? ?

## 2021-12-29 NOTE — Anesthesia Procedure Notes (Signed)
Procedure Name: LMA Insertion ?Date/Time: 12/29/2021 7:31 AM ?Performed by: Lieutenant Diego, CRNA ?Pre-anesthesia Checklist: Patient identified, Emergency Drugs available, Suction available and Patient being monitored ?Patient Re-evaluated:Patient Re-evaluated prior to induction ?Oxygen Delivery Method: Circle system utilized ?Preoxygenation: Pre-oxygenation with 100% oxygen ?Induction Type: IV induction ?Ventilation: Mask ventilation without difficulty ?LMA: LMA inserted ?LMA Size: 4.0 ?Number of attempts: 1 ?Placement Confirmation: positive ETCO2 and breath sounds checked- equal and bilateral ?Tube secured with: Tape ?Dental Injury: Teeth and Oropharynx as per pre-operative assessment  ? ? ? ? ?

## 2021-12-29 NOTE — Transfer of Care (Signed)
Immediate Anesthesia Transfer of Care Note ? ?Patient: Misty Lynch ? ?Procedure(s) Performed: REMOVAL LEFT BREAST IMPLANT (Left: Chest) ? ?Patient Location: PACU ? ?Anesthesia Type:General ? ?Level of Consciousness: awake and alert  ? ?Airway & Oxygen Therapy: Patient Spontanous Breathing and Patient connected to face mask oxygen ? ?Post-op Assessment: Report given to RN and Post -op Vital signs reviewed and stable ? ?Post vital signs: Reviewed and stable ? ?Last Vitals:  ?Vitals Value Taken Time  ?BP 136/101 12/29/21 0822  ?Temp    ?Pulse 99 12/29/21 0822  ?Resp 17 12/29/21 0822  ?SpO2 100 % 12/29/21 0822  ?Vitals shown include unvalidated device data. ? ?Last Pain:  ?Vitals:  ? 12/29/21 0632  ?TempSrc: Oral  ?PainSc: 0-No pain  ?   ? ?Patients Stated Pain Goal: 4 (12/29/21 5670) ? ?Complications: No notable events documented. ?

## 2021-12-29 NOTE — Anesthesia Preprocedure Evaluation (Signed)
Anesthesia Evaluation  Patient identified by MRN, date of birth, ID band Patient awake    Reviewed: Allergy & Precautions, NPO status , Patient's Chart, lab work & pertinent test results  Airway Mallampati: II  TM Distance: >3 FB Neck ROM: Full    Dental no notable dental hx.    Pulmonary neg pulmonary ROS,    Pulmonary exam normal breath sounds clear to auscultation       Cardiovascular hypertension, Pt. on medications Normal cardiovascular exam Rhythm:Regular Rate:Normal     Neuro/Psych negative neurological ROS  negative psych ROS   GI/Hepatic negative GI ROS, Neg liver ROS,   Endo/Other  negative endocrine ROS  Renal/GU negative Renal ROS  negative genitourinary   Musculoskeletal negative musculoskeletal ROS (+)   Abdominal   Peds negative pediatric ROS (+)  Hematology negative hematology ROS (+)   Anesthesia Other Findings   Reproductive/Obstetrics negative OB ROS                             Anesthesia Physical Anesthesia Plan  ASA: 2  Anesthesia Plan: General   Post-op Pain Management: Minimal or no pain anticipated   Induction: Intravenous  PONV Risk Score and Plan: 3 and Ondansetron, Dexamethasone and Treatment may vary due to age or medical condition  Airway Management Planned: LMA  Additional Equipment:   Intra-op Plan:   Post-operative Plan: Extubation in OR  Informed Consent: I have reviewed the patients History and Physical, chart, labs and discussed the procedure including the risks, benefits and alternatives for the proposed anesthesia with the patient or authorized representative who has indicated his/her understanding and acceptance.     Dental advisory given  Plan Discussed with: CRNA and Surgeon  Anesthesia Plan Comments:         Anesthesia Quick Evaluation  

## 2021-12-29 NOTE — Interval H&P Note (Signed)
History and Physical Interval Note: ? ?12/29/2021 ?6:54 AM ? ?Misty Lynch  has presented today for surgery, with the diagnosis of HISTORY LEFT BREAST CANCER.  The various methods of treatment have been discussed with the patient and family. After consideration of risks, benefits and other options for treatment, the patient has consented to  Procedure(s): ?REMOVAL LEFT BREAST IMPLANT (Left) as a surgical intervention.  The patient's history has been reviewed, patient examined, no change in status, stable for surgery.  I have reviewed the patient's chart and labs.  Questions were answered to the patient's satisfaction.   ? ? ?Emanie Behan ? ? ?

## 2021-12-29 NOTE — Discharge Instructions (Addendum)
No Tylenol until 12:30 ? ? ? ? ?Post Anesthesia Home Care Instructions ? ?Activity: ?Get plenty of rest for the remainder of the day. A responsible individual must stay with you for 24 hours following the procedure.  ?For the next 24 hours, DO NOT: ?-Drive a car ?-Paediatric nurse ?-Drink alcoholic beverages ?-Take any medication unless instructed by your physician ?-Make any legal decisions or sign important papers. ? ?Meals: ?Start with liquid foods such as gelatin or soup. Progress to regular foods as tolerated. Avoid greasy, spicy, heavy foods. If nausea and/or vomiting occur, drink only clear liquids until the nausea and/or vomiting subsides. Call your physician if vomiting continues. ? ?Special Instructions/Symptoms: ?Your throat may feel dry or sore from the anesthesia or the breathing tube placed in your throat during surgery. If this causes discomfort, gargle with warm salt water. The discomfort should disappear within 24 hours. ? ?If you had a scopolamine patch placed behind your ear for the management of post- operative nausea and/or vomiting: ? ?1. The medication in the patch is effective for 72 hours, after which it should be removed.  Wrap patch in a tissue and discard in the trash. Wash hands thoroughly with soap and water. ?2. You may remove the patch earlier than 72 hours if you experience unpleasant side effects which may include dry mouth, dizziness or visual disturbances. ?3. Avoid touching the patch. Wash your hands with soap and water after contact with the patch. ?    ? ? ? ?JP Drain Totals ?Bring this sheet to all of your post-operative appointments while you have your drains. ?Please measure your drains by CC's or ML's. ?Make sure you drain and measure your JP Drains 2 or 3 times per day. ?At the end of each day, add up totals for the left side and add up totals for the right side. ?   ( 9 am )     ( 3 pm )        ( 9 pm )                ?Date L  R  L  R  L  R  Total L/R  ?               ?                ?               ?               ?               ?               ?               ?               ?               ?               ?               ?               ?  ?

## 2021-12-29 NOTE — Op Note (Signed)
Operative Note  ? ?DATE OF OPERATION: 3.3.23 ? ?LOCATION: [facility]-outpatient ? ?SURGICAL DIVISION: Plastic Surgery ? ?PREOPERATIVE DIAGNOSES:  1. Exposure left chest implant 2. History breast cancer 3. Acquired absence of breasts ? ?POSTOPERATIVE DIAGNOSES:  same ? ?PROCEDURE:  Removal left chest implant ? ?SURGEON: [name] MD MBA ? ?ASSISTANT: none ? ?ANESTHESIA:  General.  ? ?EBL: 35 ml ? ?COMPLICATIONS: None immediate.  ? ?INDICATIONS FOR PROCEDURE:  ?The patient, Misty Lynch, is a 66 y.o. female born on 01-27-1956, is here for removal exposed breast implant. ?  ?FINDINGS: Removed intact smooth saline implant. Moderate seroma fluid in pocket encountered and sent for culture ? ?DESCRIPTION OF PROCEDURE:  ?The patient's operative site was marked with the patient in the preoperative area. The patient was taken to the operating room. SCDs were placed and IV antibiotics were given. The patient's operative site was prepped and draped in a sterile fashion. A time out was performed and all information was confirmed to be correct. Sharp excision wound margin left inframammary fold scar completed. Implant cavity entered and implant removed. Culture fluid obtained. Curettage performed to implant cavity. Cavity irrigated with saline solution containing Ancef, gentamicin. 83 Fr JP placed in cavity and secured with 2-0 nylon. Closure incision completed with 3-0 monocryl in superficial fascia and dermis. Skin closure completed with 4-0 monocryl subcuticular. Dermabond applied followed by dry dressing and breast binder. ? ?The patient was allowed to wake from anesthesia, extubated and taken to the recovery room in satisfactory condition.  ? ?SPECIMENS: culture ? ?DRAINS: 36 Fr JP in left implant cavity ? ?

## 2022-01-01 ENCOUNTER — Encounter (HOSPITAL_BASED_OUTPATIENT_CLINIC_OR_DEPARTMENT_OTHER): Payer: Self-pay | Admitting: Plastic Surgery

## 2022-01-01 NOTE — Progress Notes (Signed)
Left message stating courtesy call and if any questions or concerns please call the doctors office.  

## 2022-01-03 LAB — AEROBIC/ANAEROBIC CULTURE W GRAM STAIN (SURGICAL/DEEP WOUND)
Culture: NO GROWTH
Gram Stain: NONE SEEN

## 2022-01-23 DIAGNOSIS — M47812 Spondylosis without myelopathy or radiculopathy, cervical region: Secondary | ICD-10-CM | POA: Diagnosis not present

## 2022-01-23 DIAGNOSIS — I10 Essential (primary) hypertension: Secondary | ICD-10-CM | POA: Diagnosis not present

## 2022-01-23 DIAGNOSIS — F419 Anxiety disorder, unspecified: Secondary | ICD-10-CM | POA: Diagnosis not present

## 2022-02-14 DIAGNOSIS — I10 Essential (primary) hypertension: Secondary | ICD-10-CM | POA: Diagnosis not present

## 2022-02-14 DIAGNOSIS — R002 Palpitations: Secondary | ICD-10-CM | POA: Diagnosis not present

## 2022-02-14 DIAGNOSIS — Z6830 Body mass index (BMI) 30.0-30.9, adult: Secondary | ICD-10-CM | POA: Diagnosis not present

## 2022-02-19 DIAGNOSIS — R002 Palpitations: Secondary | ICD-10-CM | POA: Diagnosis not present

## 2022-02-19 DIAGNOSIS — I10 Essential (primary) hypertension: Secondary | ICD-10-CM | POA: Diagnosis not present

## 2022-02-19 DIAGNOSIS — R011 Cardiac murmur, unspecified: Secondary | ICD-10-CM | POA: Diagnosis not present

## 2022-02-21 DIAGNOSIS — D225 Melanocytic nevi of trunk: Secondary | ICD-10-CM | POA: Diagnosis not present

## 2022-02-21 DIAGNOSIS — L814 Other melanin hyperpigmentation: Secondary | ICD-10-CM | POA: Diagnosis not present

## 2022-02-21 DIAGNOSIS — L821 Other seborrheic keratosis: Secondary | ICD-10-CM | POA: Diagnosis not present

## 2022-02-21 DIAGNOSIS — I10 Essential (primary) hypertension: Secondary | ICD-10-CM | POA: Diagnosis not present

## 2022-02-21 DIAGNOSIS — L309 Dermatitis, unspecified: Secondary | ICD-10-CM | POA: Diagnosis not present

## 2022-02-21 DIAGNOSIS — L578 Other skin changes due to chronic exposure to nonionizing radiation: Secondary | ICD-10-CM | POA: Diagnosis not present

## 2022-03-02 DIAGNOSIS — I10 Essential (primary) hypertension: Secondary | ICD-10-CM | POA: Diagnosis not present

## 2022-03-05 DIAGNOSIS — R002 Palpitations: Secondary | ICD-10-CM | POA: Diagnosis not present

## 2022-03-05 DIAGNOSIS — R011 Cardiac murmur, unspecified: Secondary | ICD-10-CM | POA: Diagnosis not present

## 2022-03-07 DIAGNOSIS — I10 Essential (primary) hypertension: Secondary | ICD-10-CM | POA: Diagnosis not present

## 2022-03-07 DIAGNOSIS — E7800 Pure hypercholesterolemia, unspecified: Secondary | ICD-10-CM | POA: Diagnosis not present

## 2022-03-07 DIAGNOSIS — R7303 Prediabetes: Secondary | ICD-10-CM | POA: Diagnosis not present

## 2022-03-07 DIAGNOSIS — E559 Vitamin D deficiency, unspecified: Secondary | ICD-10-CM | POA: Diagnosis not present

## 2022-03-14 DIAGNOSIS — F419 Anxiety disorder, unspecified: Secondary | ICD-10-CM | POA: Diagnosis not present

## 2022-03-14 DIAGNOSIS — I10 Essential (primary) hypertension: Secondary | ICD-10-CM | POA: Diagnosis not present

## 2022-03-15 DIAGNOSIS — H5203 Hypermetropia, bilateral: Secondary | ICD-10-CM | POA: Diagnosis not present

## 2022-03-15 DIAGNOSIS — H43811 Vitreous degeneration, right eye: Secondary | ICD-10-CM | POA: Diagnosis not present

## 2022-03-15 DIAGNOSIS — H524 Presbyopia: Secondary | ICD-10-CM | POA: Diagnosis not present

## 2022-03-15 DIAGNOSIS — H25813 Combined forms of age-related cataract, bilateral: Secondary | ICD-10-CM | POA: Diagnosis not present

## 2022-04-03 DIAGNOSIS — R002 Palpitations: Secondary | ICD-10-CM | POA: Diagnosis not present

## 2022-04-18 DIAGNOSIS — I700 Atherosclerosis of aorta: Secondary | ICD-10-CM | POA: Diagnosis not present

## 2022-04-18 DIAGNOSIS — K8020 Calculus of gallbladder without cholecystitis without obstruction: Secondary | ICD-10-CM | POA: Diagnosis not present

## 2022-04-18 DIAGNOSIS — I701 Atherosclerosis of renal artery: Secondary | ICD-10-CM | POA: Diagnosis not present

## 2022-04-24 DIAGNOSIS — Z6830 Body mass index (BMI) 30.0-30.9, adult: Secondary | ICD-10-CM | POA: Diagnosis not present

## 2022-04-24 DIAGNOSIS — G4719 Other hypersomnia: Secondary | ICD-10-CM | POA: Diagnosis not present

## 2022-04-24 DIAGNOSIS — I10 Essential (primary) hypertension: Secondary | ICD-10-CM | POA: Diagnosis not present

## 2022-05-09 DIAGNOSIS — I10 Essential (primary) hypertension: Secondary | ICD-10-CM | POA: Diagnosis not present

## 2022-05-09 DIAGNOSIS — K8020 Calculus of gallbladder without cholecystitis without obstruction: Secondary | ICD-10-CM | POA: Diagnosis not present

## 2022-05-09 DIAGNOSIS — I471 Supraventricular tachycardia: Secondary | ICD-10-CM | POA: Diagnosis not present

## 2022-05-09 DIAGNOSIS — I700 Atherosclerosis of aorta: Secondary | ICD-10-CM | POA: Diagnosis not present

## 2022-05-11 DIAGNOSIS — I10 Essential (primary) hypertension: Secondary | ICD-10-CM | POA: Diagnosis not present

## 2022-05-11 DIAGNOSIS — E785 Hyperlipidemia, unspecified: Secondary | ICD-10-CM | POA: Diagnosis not present

## 2022-05-11 DIAGNOSIS — I471 Supraventricular tachycardia: Secondary | ICD-10-CM | POA: Diagnosis not present

## 2022-05-11 DIAGNOSIS — R001 Bradycardia, unspecified: Secondary | ICD-10-CM | POA: Diagnosis not present

## 2022-05-17 DIAGNOSIS — I10 Essential (primary) hypertension: Secondary | ICD-10-CM | POA: Diagnosis not present

## 2022-05-17 DIAGNOSIS — Z6830 Body mass index (BMI) 30.0-30.9, adult: Secondary | ICD-10-CM | POA: Diagnosis not present

## 2022-05-17 DIAGNOSIS — G4710 Hypersomnia, unspecified: Secondary | ICD-10-CM | POA: Diagnosis not present

## 2022-05-17 DIAGNOSIS — G4719 Other hypersomnia: Secondary | ICD-10-CM | POA: Diagnosis not present

## 2022-05-17 DIAGNOSIS — R001 Bradycardia, unspecified: Secondary | ICD-10-CM | POA: Diagnosis not present

## 2022-05-31 DIAGNOSIS — R0683 Snoring: Secondary | ICD-10-CM | POA: Diagnosis not present

## 2022-06-01 DIAGNOSIS — L608 Other nail disorders: Secondary | ICD-10-CM | POA: Diagnosis not present

## 2022-06-01 DIAGNOSIS — L603 Nail dystrophy: Secondary | ICD-10-CM | POA: Diagnosis not present

## 2022-06-01 DIAGNOSIS — L609 Nail disorder, unspecified: Secondary | ICD-10-CM | POA: Diagnosis not present

## 2022-06-06 DIAGNOSIS — H52223 Regular astigmatism, bilateral: Secondary | ICD-10-CM | POA: Diagnosis not present

## 2022-06-06 DIAGNOSIS — H02834 Dermatochalasis of left upper eyelid: Secondary | ICD-10-CM | POA: Diagnosis not present

## 2022-06-06 DIAGNOSIS — H02831 Dermatochalasis of right upper eyelid: Secondary | ICD-10-CM | POA: Diagnosis not present

## 2022-06-06 DIAGNOSIS — H25813 Combined forms of age-related cataract, bilateral: Secondary | ICD-10-CM | POA: Diagnosis not present

## 2022-06-06 DIAGNOSIS — H527 Unspecified disorder of refraction: Secondary | ICD-10-CM | POA: Diagnosis not present

## 2022-06-06 DIAGNOSIS — H35373 Puckering of macula, bilateral: Secondary | ICD-10-CM | POA: Diagnosis not present

## 2022-06-20 DIAGNOSIS — H26493 Other secondary cataract, bilateral: Secondary | ICD-10-CM | POA: Diagnosis not present

## 2022-06-28 DIAGNOSIS — Z888 Allergy status to other drugs, medicaments and biological substances status: Secondary | ICD-10-CM | POA: Diagnosis not present

## 2022-06-28 DIAGNOSIS — H35373 Puckering of macula, bilateral: Secondary | ICD-10-CM | POA: Diagnosis not present

## 2022-06-28 DIAGNOSIS — H52223 Regular astigmatism, bilateral: Secondary | ICD-10-CM | POA: Diagnosis not present

## 2022-06-28 DIAGNOSIS — H25811 Combined forms of age-related cataract, right eye: Secondary | ICD-10-CM | POA: Diagnosis not present

## 2022-06-28 DIAGNOSIS — I10 Essential (primary) hypertension: Secondary | ICD-10-CM | POA: Diagnosis not present

## 2022-06-28 DIAGNOSIS — Z79899 Other long term (current) drug therapy: Secondary | ICD-10-CM | POA: Diagnosis not present

## 2022-06-28 DIAGNOSIS — Z7982 Long term (current) use of aspirin: Secondary | ICD-10-CM | POA: Diagnosis not present

## 2022-07-03 ENCOUNTER — Ambulatory Visit: Payer: Self-pay | Admitting: Surgery

## 2022-07-03 DIAGNOSIS — K8010 Calculus of gallbladder with chronic cholecystitis without obstruction: Secondary | ICD-10-CM | POA: Diagnosis not present

## 2022-07-03 NOTE — H&P (Signed)
Subjective    Chief Complaint: New Consultation (Gallbladder)       History of Present Illness: Misty Lynch is a 66 y.o. female who is seen today as an office consultation at the request of Dr. Sabra Heck for evaluation of New Consultation (Gallbladder) .     This is a 66 year old female with a history of left breast cancer status post bilateral nipple sparing mastectomies and reconstruction.  She has had multiple complications with her reconstruction.  Currently she does not have a left breast implant.  She was being evaluated by a cardiologist for hypertension and a heart murmur.  Ultrasound incidentally noted the presence of a gallstone.  This was confirmed on a subsequent CT scan.  In retrospect, the patient has had intermittent symptoms over the last several months of right upper quadrant abdominal pain associated with nausea, diarrhea.  She does have some postprandial symptoms.  She has not had any hospitalizations or emergency department visits for this problem.     Review of Systems: A complete review of systems was obtained from the patient.  I have reviewed this information and discussed as appropriate with the patient.  See HPI as well for other ROS.   Review of Systems  Constitutional: Negative.   HENT: Negative.    Eyes: Negative.   Respiratory: Negative.    Cardiovascular:  Positive for palpitations.  Gastrointestinal:  Positive for abdominal pain, diarrhea and nausea.  Genitourinary: Negative.   Musculoskeletal: Negative.   Skin: Negative.   Neurological: Negative.   Endo/Heme/Allergies: Negative.   Psychiatric/Behavioral:  Positive for depression. The patient is nervous/anxious.        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       hydroCHLOROthiazide (HYDRODIURIL) 25 MG tablet Take by mouth       LORazepam (ATIVAN) 0.5 MG tablet 1 tablet       losartan (COZAAR) 100 MG tablet Take by mouth       metoprolol succinate (TOPROL-XL) 25 MG XL tablet Take by mouth       rosuvastatin (CRESTOR) 5 MG tablet Take by mouth        No current facility-administered medications on file prior to visit.      Family History       Family History  Problem Relation Age of Onset   High blood pressure (Hypertension) Father     Diabetes Father     Obesity Sister     High blood pressure (Hypertension) Sister     Diabetes Sister          Social History       Tobacco Use  Smoking Status Never  Smokeless Tobacco Never      Social History  Social History        Socioeconomic History   Marital status: Married  Tobacco Use   Smoking status: Never   Smokeless tobacco: Never  Vaping Use   Vaping Use: Never used  Substance and Sexual Activity   Alcohol use: Not Currently   Drug use: Never        Objective:         Vitals:    07/03/22 1340  BP: (!) 160/80  Pulse: 66  Temp: 37.7 C (99.8 F)  SpO2: 97%  Weight: 83.1 kg (183 lb 3.2 oz)  Height: 163.8 cm (5' 4.5")    Body mass index is 30.96 kg/m.   Physical Exam    Constitutional:  WDWN in NAD, conversant, no obvious deformities; lying in bed  comfortably Eyes:  Pupils equal, round; sclera anicteric; moist conjunctiva; no lid lag HENT:  Oral mucosa moist; good dentition  Neck:  No masses palpated, trachea midline; no thyromegaly Lungs:  CTA bilaterally; normal respiratory effort CV:  Regular rate and rhythm; no murmurs; extremities well-perfused with no edema Abd:  +bowel sounds, soft, non-tender, no palpable organomegaly; no palpable hernias Musc:  Unable to assess gait; no apparent clubbing or cyanosis in extremities Lymphatic:  No palpable cervical or axillary lymphadenopathy Skin:  Warm, dry; no sign of jaundice Psychiatric - alert and oriented x 4; calm mood and affect     Labs, Imaging and Diagnostic Testing: CT scan 04/18/22 at [facility] - 13 mm gallstone; no cholecystitis   Assessment and Plan:  Diagnoses and all orders for this visit:   Calculus of gallbladder with chronic cholecystitis without obstruction       The patient appears to have intermittent mild symptoms associated with her gallbladder.  No history of acute cholecystitis.  No signs of jaundice.   Recommend laparoscopic cholecystectomy with intraoperative cholangiogram on elective basis.The surgical procedure has been discussed with the patient.  Potential risks, benefits, alternative treatments, and expected outcomes have been explained.  All of the patient's questions at this time have been answered.  The likelihood of reaching the patient's treatment goal is good.  The patient understand the proposed surgical procedure and wishes to proceed.     We will obtain cardiac clearance from her cardiologist   Carlean Jews, MD  07/03/2022 2:11 PM

## 2022-07-17 DIAGNOSIS — I10 Essential (primary) hypertension: Secondary | ICD-10-CM | POA: Diagnosis not present

## 2022-07-17 DIAGNOSIS — E669 Obesity, unspecified: Secondary | ICD-10-CM | POA: Diagnosis not present

## 2022-07-17 DIAGNOSIS — Z881 Allergy status to other antibiotic agents status: Secondary | ICD-10-CM | POA: Diagnosis not present

## 2022-07-17 DIAGNOSIS — H52223 Regular astigmatism, bilateral: Secondary | ICD-10-CM | POA: Diagnosis not present

## 2022-07-17 DIAGNOSIS — H35373 Puckering of macula, bilateral: Secondary | ICD-10-CM | POA: Diagnosis not present

## 2022-07-17 DIAGNOSIS — H25813 Combined forms of age-related cataract, bilateral: Secondary | ICD-10-CM | POA: Diagnosis not present

## 2022-07-17 DIAGNOSIS — H02834 Dermatochalasis of left upper eyelid: Secondary | ICD-10-CM | POA: Diagnosis not present

## 2022-07-17 DIAGNOSIS — Z91048 Other nonmedicinal substance allergy status: Secondary | ICD-10-CM | POA: Diagnosis not present

## 2022-07-17 DIAGNOSIS — H25812 Combined forms of age-related cataract, left eye: Secondary | ICD-10-CM | POA: Diagnosis not present

## 2022-07-23 NOTE — Progress Notes (Signed)
Patient Care Team: Kathyrn Lass, MD as PCP - General (Family Medicine) Excell Seltzer, MD (Inactive) as Consulting Physician (General Surgery) Nicholas Lose, MD as Consulting Physician (Hematology and Oncology) Kyung Rudd, MD as Consulting Physician (Radiation Oncology) Delice Bison Charlestine Massed, NP as Nurse Practitioner (Hematology and Oncology)  DIAGNOSIS: No diagnosis found.  SUMMARY OF ONCOLOGIC HISTORY: Oncology History  Malignant neoplasm of upper-outer quadrant of left breast in female, estrogen receptor positive ([place])  06/24/2017 Initial Diagnosis   Left breast biopsy upper outer quadrant: Invasive lobular cancer with LCIS; upper inner quadrant: Invasive lobular cancer with ADH, grade 1-2, ER 40%, PR 90%, Ki-67 2%, HER-2 negative ratio 1.19; tumor size indeterminate but based on the 2 mm calcifications it is currently being staged as T1 1 N0 stage IA, however we suspect that it is inaccurate clinical staging   08/23/2017 Surgery   Left mastectomy: LCIS, invasive cancer not identified in the specimen, 0/4 lymph nodes negative, based on the biopsy, 2 foci of invasive lobular cancer larger span 0.7 cm grade 2 ER 40%, PR 90%, HER-2 negative ratio 1.19, Ki-67 2%, T1BN0 stage I A;  right mastectomy: Atypical lobular hyperplasia   08/2017 -  Anti-estrogen oral therapy   Anastrozole daily   09/17/2017 Surgery   Breast reconstruction surgery by Dr. Iran Planas     CHIEF COMPLIANT: Follow-up of left breast cancer on anastrozole    INTERVAL HISTORY: Misty Lynch is a 66 y.o. with above-mentioned history of left breast cancer treated with bilateral mastectomies with reconstruction and who is currently on antiestrogen therapy with anastrozole. She presents to the clinic today for annual follow-up.    ALLERGIES:  is allergic to tetracyclines & related.  MEDICATIONS:  Current Outpatient Medications  Medication Sig Dispense Refill   amLODipine (NORVASC) 5 MG tablet Take 5 mg by  mouth daily.     anastrozole (ARIMIDEX) 1 MG tablet Take 1 tablet (1 mg total) by mouth daily. 90 tablet 3   aspirin EC 81 MG tablet Take 81 mg by mouth daily.     Calcium-Vitamin D-Vitamin K 500-100-40 MG-UNT-MCG CHEW Chew 1 tablet by mouth 2 (two) times daily with a meal.      Cholecalciferol (WELLESSE VITAMIN D3) 1000 UNIT/10ML LIQD Take 10 mLs by mouth daily.     HYDROcodone-acetaminophen (NORCO) 5-325 MG tablet Take 1 tablet by mouth every 6 (six) hours as needed for moderate pain. 15 tablet 0   losartan-hydrochlorothiazide (HYZAAR) 100-25 MG tablet Take 1 tablet by mouth daily.     rosuvastatin (CRESTOR) 5 MG tablet Take 5 mg by mouth daily.     sulfamethoxazole-trimethoprim (BACTRIM DS) 800-160 MG tablet Take 1 tablet by mouth 2 (two) times daily. 10 tablet 0   No current facility-administered medications for this visit.    PHYSICAL EXAMINATION: ECOG PERFORMANCE STATUS: {CHL ONC ECOG [phone]}  There were no vitals filed for this visit. There were no vitals filed for this visit.  BREAST:*** No palpable masses or nodules in either right or left breasts. No palpable axillary supraclavicular or infraclavicular adenopathy no breast tenderness or nipple discharge. (exam performed in the presence of a chaperone)  LABORATORY DATA:  I have reviewed the data as listed    Latest Ref Rng & Units 12/29/2021    6:39 AM 11/27/2021    9:00 AM 03/31/2019    8:32 AM  CMP  Glucose 70 - 99 mg/dL 108  115  113   BUN 8 - 23 mg/dL 12  13  11  Creatinine 0.44 - 1.00 mg/dL 1.02  0.74  0.80   Sodium 135 - 145 mmol/L 137  138  141   Potassium 3.5 - 5.1 mmol/L 3.3  3.4  3.4   Chloride 98 - 111 mmol/L 100  97  102   CO2 22 - 32 mmol/L _0 Calcium 8.9 - 10.3 mg/dL 9.5  9.6  9.8     Lab Results  Component Value Date   WBC 7.7 10/27/2018   HGB 13.8 10/27/2018   HCT 42.4 10/27/2018   MCV 89.3 10/27/2018   PLT 288 10/27/2018   NEUTROABS 4.7 10/27/2018    ASSESSMENT & PLAN:  No  problem-specific Assessment & Plan notes found for this encounter.    No orders of the defined types were placed in this encounter.  The patient has a good understanding of the overall plan. she agrees with it. she will call with any problems that may develop before the next visit here. Total time spent: 30 mins including face to face time and time spent for planning, charting and co-ordination of care   Suzzette Righter, Joy 07/23/22    I Gardiner Coins am scribing for Dr. Lindi Adie  ***

## 2022-07-26 ENCOUNTER — Other Ambulatory Visit: Payer: Self-pay

## 2022-07-26 ENCOUNTER — Inpatient Hospital Stay: Payer: Medicare Other | Attending: Hematology and Oncology | Admitting: Hematology and Oncology

## 2022-07-26 DIAGNOSIS — Z79811 Long term (current) use of aromatase inhibitors: Secondary | ICD-10-CM | POA: Diagnosis not present

## 2022-07-26 DIAGNOSIS — Z9013 Acquired absence of bilateral breasts and nipples: Secondary | ICD-10-CM | POA: Insufficient documentation

## 2022-07-26 DIAGNOSIS — Z79899 Other long term (current) drug therapy: Secondary | ICD-10-CM | POA: Diagnosis not present

## 2022-07-26 DIAGNOSIS — C50412 Malignant neoplasm of upper-outer quadrant of left female breast: Secondary | ICD-10-CM | POA: Insufficient documentation

## 2022-07-26 DIAGNOSIS — Z170 Estrogen receptor positive status [ER+]: Secondary | ICD-10-CM

## 2022-07-26 MED ORDER — ANASTROZOLE 1 MG PO TABS: 1.0000 mg | ORAL_TABLET | Freq: Every day | ORAL | 3 refills | Status: DC

## 2022-07-26 NOTE — Assessment & Plan Note (Signed)
08/23/2017 left mastectomy: LCIS, invasive cancer not identified in the specimen, 0/4 lymph nodes negative, based on the biopsy, 2 foci of invasive lobular cancer larger span 0.7 cm grade 2 ER 40%, PR 90%, HER-2 negative ratio 1.19, Ki-67 2%, T1BN0 stage I A; right mastectomy: Atypical lobular hyperplasia  Current treatment: Adjuvant anastrozole 1 mg daily started 09/02/2017  Anastrozole toxicities: Denies any adverse effects to anastrozole, occasional joint stiffness especially when she sits down for long period of time.  Breast cancer surveillance: 1.Breast exam9/28/2023: Benign 2.mammogram not indicated since she had bilateral mastectomies: 3.Bone density 08/28/2021 osteopenia T score -0.9: Normal  Return to clinic 1 year for follow-up

## 2022-08-21 DIAGNOSIS — Z23 Encounter for immunization: Secondary | ICD-10-CM | POA: Diagnosis not present

## 2022-09-04 ENCOUNTER — Other Ambulatory Visit: Payer: Self-pay | Admitting: Surgery

## 2022-09-04 DIAGNOSIS — K8010 Calculus of gallbladder with chronic cholecystitis without obstruction: Secondary | ICD-10-CM | POA: Diagnosis not present

## 2022-10-01 DIAGNOSIS — E7800 Pure hypercholesterolemia, unspecified: Secondary | ICD-10-CM | POA: Diagnosis not present

## 2022-10-01 DIAGNOSIS — E6609 Other obesity due to excess calories: Secondary | ICD-10-CM | POA: Diagnosis not present

## 2022-10-01 DIAGNOSIS — Z0000 Encounter for general adult medical examination without abnormal findings: Secondary | ICD-10-CM | POA: Diagnosis not present

## 2022-10-01 DIAGNOSIS — I10 Essential (primary) hypertension: Secondary | ICD-10-CM | POA: Diagnosis not present

## 2022-10-01 DIAGNOSIS — Z23 Encounter for immunization: Secondary | ICD-10-CM | POA: Diagnosis not present

## 2022-10-01 DIAGNOSIS — I471 Supraventricular tachycardia, unspecified: Secondary | ICD-10-CM | POA: Diagnosis not present

## 2022-10-01 DIAGNOSIS — F419 Anxiety disorder, unspecified: Secondary | ICD-10-CM | POA: Diagnosis not present

## 2022-10-01 DIAGNOSIS — Z1389 Encounter for screening for other disorder: Secondary | ICD-10-CM | POA: Diagnosis not present

## 2022-10-01 DIAGNOSIS — Z6831 Body mass index (BMI) 31.0-31.9, adult: Secondary | ICD-10-CM | POA: Diagnosis not present

## 2022-10-01 DIAGNOSIS — C50912 Malignant neoplasm of unspecified site of left female breast: Secondary | ICD-10-CM | POA: Diagnosis not present

## 2022-10-26 DIAGNOSIS — R39198 Other difficulties with micturition: Secondary | ICD-10-CM | POA: Diagnosis not present

## 2022-11-22 DIAGNOSIS — F411 Generalized anxiety disorder: Secondary | ICD-10-CM | POA: Diagnosis not present

## 2022-11-22 DIAGNOSIS — R6881 Early satiety: Secondary | ICD-10-CM | POA: Diagnosis not present

## 2022-11-22 DIAGNOSIS — R110 Nausea: Secondary | ICD-10-CM | POA: Diagnosis not present

## 2022-11-22 DIAGNOSIS — Z6831 Body mass index (BMI) 31.0-31.9, adult: Secondary | ICD-10-CM | POA: Diagnosis not present

## 2022-11-27 DIAGNOSIS — E785 Hyperlipidemia, unspecified: Secondary | ICD-10-CM | POA: Diagnosis not present

## 2022-11-27 DIAGNOSIS — I471 Supraventricular tachycardia, unspecified: Secondary | ICD-10-CM | POA: Diagnosis not present

## 2022-11-27 DIAGNOSIS — R0989 Other specified symptoms and signs involving the circulatory and respiratory systems: Secondary | ICD-10-CM | POA: Diagnosis not present

## 2022-11-27 DIAGNOSIS — I10 Essential (primary) hypertension: Secondary | ICD-10-CM | POA: Diagnosis not present

## 2022-11-27 DIAGNOSIS — Z1330 Encounter for screening examination for mental health and behavioral disorders, unspecified: Secondary | ICD-10-CM | POA: Diagnosis not present

## 2022-12-14 DIAGNOSIS — R0989 Other specified symptoms and signs involving the circulatory and respiratory systems: Secondary | ICD-10-CM | POA: Diagnosis not present

## 2022-12-20 DIAGNOSIS — F325 Major depressive disorder, single episode, in full remission: Secondary | ICD-10-CM | POA: Diagnosis not present

## 2022-12-20 DIAGNOSIS — I10 Essential (primary) hypertension: Secondary | ICD-10-CM | POA: Diagnosis not present

## 2022-12-20 DIAGNOSIS — F411 Generalized anxiety disorder: Secondary | ICD-10-CM | POA: Diagnosis not present

## 2022-12-24 DIAGNOSIS — H019 Unspecified inflammation of eyelid: Secondary | ICD-10-CM | POA: Diagnosis not present

## 2022-12-24 DIAGNOSIS — L658 Other specified nonscarring hair loss: Secondary | ICD-10-CM | POA: Diagnosis not present

## 2022-12-24 DIAGNOSIS — L219 Seborrheic dermatitis, unspecified: Secondary | ICD-10-CM | POA: Diagnosis not present

## 2023-02-04 DIAGNOSIS — H43811 Vitreous degeneration, right eye: Secondary | ICD-10-CM | POA: Diagnosis not present

## 2023-02-04 DIAGNOSIS — Z961 Presence of intraocular lens: Secondary | ICD-10-CM | POA: Diagnosis not present

## 2023-02-04 DIAGNOSIS — H04123 Dry eye syndrome of bilateral lacrimal glands: Secondary | ICD-10-CM | POA: Diagnosis not present

## 2023-02-12 DIAGNOSIS — I471 Supraventricular tachycardia, unspecified: Secondary | ICD-10-CM | POA: Diagnosis not present

## 2023-03-05 DIAGNOSIS — L814 Other melanin hyperpigmentation: Secondary | ICD-10-CM | POA: Diagnosis not present

## 2023-03-05 DIAGNOSIS — L578 Other skin changes due to chronic exposure to nonionizing radiation: Secondary | ICD-10-CM | POA: Diagnosis not present

## 2023-03-05 DIAGNOSIS — D225 Melanocytic nevi of trunk: Secondary | ICD-10-CM | POA: Diagnosis not present

## 2023-03-05 DIAGNOSIS — L821 Other seborrheic keratosis: Secondary | ICD-10-CM | POA: Diagnosis not present

## 2023-04-02 DIAGNOSIS — C50912 Malignant neoplasm of unspecified site of left female breast: Secondary | ICD-10-CM | POA: Diagnosis not present

## 2023-04-02 DIAGNOSIS — E6609 Other obesity due to excess calories: Secondary | ICD-10-CM | POA: Diagnosis not present

## 2023-04-02 DIAGNOSIS — G250 Essential tremor: Secondary | ICD-10-CM | POA: Diagnosis not present

## 2023-04-02 DIAGNOSIS — I10 Essential (primary) hypertension: Secondary | ICD-10-CM | POA: Diagnosis not present

## 2023-04-02 DIAGNOSIS — F411 Generalized anxiety disorder: Secondary | ICD-10-CM | POA: Diagnosis not present

## 2023-04-02 DIAGNOSIS — E7800 Pure hypercholesterolemia, unspecified: Secondary | ICD-10-CM | POA: Diagnosis not present

## 2023-04-02 DIAGNOSIS — F325 Major depressive disorder, single episode, in full remission: Secondary | ICD-10-CM | POA: Diagnosis not present

## 2023-04-02 DIAGNOSIS — Z6831 Body mass index (BMI) 31.0-31.9, adult: Secondary | ICD-10-CM | POA: Diagnosis not present

## 2023-04-02 DIAGNOSIS — I471 Supraventricular tachycardia, unspecified: Secondary | ICD-10-CM | POA: Diagnosis not present

## 2023-04-02 DIAGNOSIS — I700 Atherosclerosis of aorta: Secondary | ICD-10-CM | POA: Diagnosis not present

## 2023-04-02 DIAGNOSIS — I6529 Occlusion and stenosis of unspecified carotid artery: Secondary | ICD-10-CM | POA: Diagnosis not present

## 2023-04-02 DIAGNOSIS — R7303 Prediabetes: Secondary | ICD-10-CM | POA: Diagnosis not present

## 2023-05-14 DIAGNOSIS — I10 Essential (primary) hypertension: Secondary | ICD-10-CM | POA: Diagnosis not present

## 2023-05-14 DIAGNOSIS — R233 Spontaneous ecchymoses: Secondary | ICD-10-CM | POA: Diagnosis not present

## 2023-05-16 DIAGNOSIS — L299 Pruritus, unspecified: Secondary | ICD-10-CM | POA: Diagnosis not present

## 2023-05-16 DIAGNOSIS — H6123 Impacted cerumen, bilateral: Secondary | ICD-10-CM | POA: Diagnosis not present

## 2023-05-16 DIAGNOSIS — L309 Dermatitis, unspecified: Secondary | ICD-10-CM | POA: Diagnosis not present

## 2023-05-21 DIAGNOSIS — I788 Other diseases of capillaries: Secondary | ICD-10-CM | POA: Diagnosis not present

## 2023-07-25 NOTE — Assessment & Plan Note (Deleted)
08/23/2017 left mastectomy: LCIS, invasive cancer not identified in the specimen, 0/4 lymph nodes negative, based on the biopsy, 2 foci of invasive lobular cancer larger span 0.7 cm grade 2 ER 40%, PR 90%, HER-2 negative ratio 1.19, Ki-67 2%, T1BN0 stage I A;  right mastectomy: Atypical lobular hyperplasia   Current treatment: Adjuvant anastrozole 1 mg daily started 09/02/2017   Anastrozole toxicities: Denies any adverse effects to anastrozole, occasional joint stiffness especially when she sits down for long period of time. Plan to treat her for 7 years.   Breast cancer surveillance: 1.  Breast exam 07/29/2023: Benign 2. mammogram not indicated since she had bilateral mastectomies: 3.  Bone density 08/28/2021 osteopenia T score -0.9: Normal   Issues with breast reconstruction: Because of an extensive pain related to the implant as well as complications related to it, she does not want to do any implant on the left breast.   Return to clinic 1 year for follow-up

## 2023-07-29 ENCOUNTER — Inpatient Hospital Stay: Payer: Medicare Other | Admitting: Hematology and Oncology

## 2023-07-30 DIAGNOSIS — H6123 Impacted cerumen, bilateral: Secondary | ICD-10-CM | POA: Diagnosis not present

## 2023-07-30 DIAGNOSIS — L309 Dermatitis, unspecified: Secondary | ICD-10-CM | POA: Diagnosis not present

## 2023-07-30 DIAGNOSIS — Z8669 Personal history of other diseases of the nervous system and sense organs: Secondary | ICD-10-CM | POA: Diagnosis not present

## 2023-07-30 DIAGNOSIS — L2989 Other pruritus: Secondary | ICD-10-CM | POA: Diagnosis not present

## 2023-07-31 ENCOUNTER — Other Ambulatory Visit: Payer: Self-pay | Admitting: Hematology and Oncology

## 2023-08-02 ENCOUNTER — Telehealth: Payer: Self-pay | Admitting: Hematology and Oncology

## 2023-08-02 NOTE — Telephone Encounter (Signed)
Per IB message on 08/01/23; I called patient and scheduled appointment. She is aware of the date and time of appointment.

## 2023-08-16 DIAGNOSIS — L309 Dermatitis, unspecified: Secondary | ICD-10-CM | POA: Diagnosis not present

## 2023-08-16 DIAGNOSIS — H6123 Impacted cerumen, bilateral: Secondary | ICD-10-CM | POA: Diagnosis not present

## 2023-08-16 DIAGNOSIS — B369 Superficial mycosis, unspecified: Secondary | ICD-10-CM | POA: Diagnosis not present

## 2023-08-16 DIAGNOSIS — H6240 Otitis externa in other diseases classified elsewhere, unspecified ear: Secondary | ICD-10-CM | POA: Diagnosis not present

## 2023-08-16 DIAGNOSIS — L299 Pruritus, unspecified: Secondary | ICD-10-CM | POA: Diagnosis not present

## 2023-08-27 DIAGNOSIS — E785 Hyperlipidemia, unspecified: Secondary | ICD-10-CM | POA: Diagnosis not present

## 2023-08-27 DIAGNOSIS — R002 Palpitations: Secondary | ICD-10-CM | POA: Diagnosis not present

## 2023-08-27 DIAGNOSIS — I10 Essential (primary) hypertension: Secondary | ICD-10-CM | POA: Diagnosis not present

## 2023-09-19 DIAGNOSIS — R399 Unspecified symptoms and signs involving the genitourinary system: Secondary | ICD-10-CM | POA: Diagnosis not present

## 2023-09-19 DIAGNOSIS — Z6833 Body mass index (BMI) 33.0-33.9, adult: Secondary | ICD-10-CM | POA: Diagnosis not present

## 2023-09-24 DIAGNOSIS — L820 Inflamed seborrheic keratosis: Secondary | ICD-10-CM | POA: Diagnosis not present

## 2023-09-24 DIAGNOSIS — L719 Rosacea, unspecified: Secondary | ICD-10-CM | POA: Diagnosis not present

## 2023-09-24 DIAGNOSIS — L219 Seborrheic dermatitis, unspecified: Secondary | ICD-10-CM | POA: Diagnosis not present

## 2023-10-09 DIAGNOSIS — F325 Major depressive disorder, single episode, in full remission: Secondary | ICD-10-CM | POA: Diagnosis not present

## 2023-10-09 DIAGNOSIS — I6529 Occlusion and stenosis of unspecified carotid artery: Secondary | ICD-10-CM | POA: Diagnosis not present

## 2023-10-09 DIAGNOSIS — Z23 Encounter for immunization: Secondary | ICD-10-CM | POA: Diagnosis not present

## 2023-10-09 DIAGNOSIS — N3941 Urge incontinence: Secondary | ICD-10-CM | POA: Diagnosis not present

## 2023-10-09 DIAGNOSIS — R7303 Prediabetes: Secondary | ICD-10-CM | POA: Diagnosis not present

## 2023-10-09 DIAGNOSIS — H6120 Impacted cerumen, unspecified ear: Secondary | ICD-10-CM | POA: Diagnosis not present

## 2023-10-09 DIAGNOSIS — E7800 Pure hypercholesterolemia, unspecified: Secondary | ICD-10-CM | POA: Diagnosis not present

## 2023-10-09 DIAGNOSIS — I10 Essential (primary) hypertension: Secondary | ICD-10-CM | POA: Diagnosis not present

## 2023-10-09 DIAGNOSIS — C50912 Malignant neoplasm of unspecified site of left female breast: Secondary | ICD-10-CM | POA: Diagnosis not present

## 2023-10-09 DIAGNOSIS — I700 Atherosclerosis of aorta: Secondary | ICD-10-CM | POA: Diagnosis not present

## 2023-10-09 DIAGNOSIS — Z0000 Encounter for general adult medical examination without abnormal findings: Secondary | ICD-10-CM | POA: Diagnosis not present

## 2023-10-09 DIAGNOSIS — I471 Supraventricular tachycardia, unspecified: Secondary | ICD-10-CM | POA: Diagnosis not present

## 2023-11-04 ENCOUNTER — Inpatient Hospital Stay: Payer: Medicare Other | Attending: Hematology and Oncology | Admitting: Hematology and Oncology

## 2023-11-04 VITALS — BP 146/64 | HR 64 | Temp 97.9°F | Resp 18 | Ht 64.0 in | Wt 197.3 lb

## 2023-11-04 DIAGNOSIS — C50412 Malignant neoplasm of upper-outer quadrant of left female breast: Secondary | ICD-10-CM | POA: Insufficient documentation

## 2023-11-04 DIAGNOSIS — M8580 Other specified disorders of bone density and structure, unspecified site: Secondary | ICD-10-CM | POA: Insufficient documentation

## 2023-11-04 DIAGNOSIS — Z780 Asymptomatic menopausal state: Secondary | ICD-10-CM

## 2023-11-04 DIAGNOSIS — Z170 Estrogen receptor positive status [ER+]: Secondary | ICD-10-CM | POA: Insufficient documentation

## 2023-11-04 DIAGNOSIS — Z79811 Long term (current) use of aromatase inhibitors: Secondary | ICD-10-CM | POA: Diagnosis not present

## 2023-11-04 DIAGNOSIS — Z79899 Other long term (current) drug therapy: Secondary | ICD-10-CM | POA: Diagnosis not present

## 2023-11-04 DIAGNOSIS — Z9013 Acquired absence of bilateral breasts and nipples: Secondary | ICD-10-CM | POA: Insufficient documentation

## 2023-11-04 MED ORDER — ANASTROZOLE 1 MG PO TABS: 1.0000 mg | ORAL_TABLET | Freq: Every day | ORAL | 2 refills | Status: DC

## 2023-11-04 NOTE — Progress Notes (Signed)
 Patient Care Team: Cleotilde Planas, MD as PCP - General (Family Medicine) Mikell Katz, MD (Inactive) as Consulting Physician (General Surgery) Odean Potts, MD as Consulting Physician (Hematology and Oncology) Dewey Rush, MD as Consulting Physician (Radiation Oncology) Crawford Morna Pickle, NP as Nurse Practitioner (Hematology and Oncology)  DIAGNOSIS:  Encounter Diagnoses  Name Primary?   Malignant neoplasm of upper-outer quadrant of left breast in female, estrogen receptor positive ([facility]) Yes   Post-menopausal     SUMMARY OF ONCOLOGIC HISTORY: Oncology History  Malignant neoplasm of upper-outer quadrant of left breast in female, estrogen receptor positive ([facility])  06/24/2017 Initial Diagnosis   Left breast biopsy upper outer quadrant: Invasive lobular cancer with LCIS; upper inner quadrant: Invasive lobular cancer with ADH, grade 1-2, ER 40%, PR 90%, Ki-67 2%, HER-2 negative ratio 1.19; tumor size indeterminate but based on the 2 mm calcifications it is currently being staged as T1 1 N0 stage IA, however we suspect that it is inaccurate clinical staging   08/23/2017 Surgery   Left mastectomy: LCIS, invasive cancer not identified in the specimen, 0/4 lymph nodes negative, based on the biopsy, 2 foci of invasive lobular cancer larger span 0.7 cm grade 2 ER 40%, PR 90%, HER-2 negative ratio 1.19, Ki-67 2%, T1BN0 stage I A;  right mastectomy: Atypical lobular hyperplasia   08/2017 -  Anti-estrogen oral therapy   Anastrozole  daily   09/17/2017 Surgery   Breast reconstruction surgery by Dr. Arelia     CHIEF COMPLIANT: Follow-up on anastrozole   HISTORY OF PRESENT ILLNESS:   History of Present Illness   Misty Lynch, a patient with a history of breast cancer and issues with a previous implant, presents for her annual check-up. She is completing her seven-year course of anastrozole , with occasional hot flashes and joint stiffness as the only side effects. The stiffness is more  pronounced after sitting for extended periods. She admits to a lapse in her exercise routine but plans to resume. She denies any chest pain or discomfort. She has had issues with a previous implant, leading to infections and leakage. She has decided against another implant and plans to have the current one removed.         ALLERGIES:  is allergic to tetracyclines & related.  MEDICATIONS:  Current Outpatient Medications  Medication Sig Dispense Refill   amLODipine  (NORVASC ) 5 MG tablet Take 5 mg by mouth daily.     anastrozole  (ARIMIDEX ) 1 MG tablet Take 1 tablet (1 mg total) by mouth daily. 90 tablet 2   aspirin EC 81 MG tablet Take 81 mg by mouth daily.     Calcium-Vitamin D-Vitamin K 500-100-40 MG-UNT-MCG CHEW Chew 1 tablet by mouth 2 (two) times daily with a meal.      losartan -hydrochlorothiazide  (HYZAAR) 100-25 MG tablet Take 1 tablet by mouth daily.     rosuvastatin (CRESTOR) 5 MG tablet Take 5 mg by mouth daily.     No current facility-administered medications for this visit.    PHYSICAL EXAMINATION: ECOG PERFORMANCE STATUS: 1 - Symptomatic but completely ambulatory  Vitals:   11/04/23 1138  BP: (!) 146/64  Pulse: 64  Resp: 18  Temp: 97.9 F (36.6 C)  SpO2: 100%   Filed Weights   11/04/23 1138  Weight: 197 lb 4.8 oz (89.5 kg)    Physical Exam   BREAST: No lumps or bumps identified upon inspection and palpation of the breast tissue and underarm area.      (exam performed in the presence of a chaperone)  LABORATORY DATA:  I have reviewed the data as listed    Latest Ref Rng & Units 12/29/2021    6:39 AM 11/27/2021    9:00 AM 03/31/2019    8:32 AM  CMP  Glucose 70 - 99 mg/dL 891  884  886   BUN 8 - 23 mg/dL 12  13  11    Creatinine 0.44 - 1.00 mg/dL 8.97  9.25  9.19   Sodium 135 - 145 mmol/L 137  138  141   Potassium 3.5 - 5.1 mmol/L 3.3  3.4  3.4   Chloride 98 - 111 mmol/L 100  97  102   CO2 22 - 32 mmol/L 29  29  29    Calcium 8.9 - 10.3 mg/dL 9.5  9.6  9.8      Lab Results  Component Value Date   WBC 7.7 10/27/2018   HGB 13.8 10/27/2018   HCT 42.4 10/27/2018   MCV 89.3 10/27/2018   PLT 288 10/27/2018   NEUTROABS 4.7 10/27/2018    ASSESSMENT & PLAN:  Malignant neoplasm of upper-outer quadrant of left breast in female, estrogen receptor positive ([facility]) 08/23/2017 left mastectomy: LCIS, invasive cancer not identified in the specimen, 0/4 lymph nodes negative, based on the biopsy, 2 foci of invasive lobular cancer larger span 0.7 cm grade 2 ER 40%, PR 90%, HER-2 negative ratio 1.19, Ki-67 2%, T1BN0 stage I A;  right mastectomy: Atypical lobular hyperplasia   Current treatment: Adjuvant anastrozole  1 mg daily started 09/02/2017   Anastrozole  toxicities: Denies any adverse effects to anastrozole , occasional joint stiffness especially when she sits down for long period of time. Plan to treat her for 7 years.   Breast cancer surveillance: 1.  Breast exam 11/03/2024: Benign 2. mammogram not indicated since she had bilateral mastectomies: 3.  Bone density 08/28/2021 osteopenia T score -0.9: Normal   Issues with breast reconstruction: Because of an extensive issues related to the implant as well as complications related to it, she does not want to do any implant on the left breast.   Return to clinic 1 year for follow-up ------------------------------------- Assessment and Plan    Breast Cancer Survivorship Patient is completing 7 years of Anastrozole  with minimal side effects. No new breast symptoms or physical exam findings. Discussed the use of a new blood-based marker for surveillance. -Continue Anastrozole  until November 2025. -Set up blood-based marker test for surveillance. -Refill Anastrozole  prescription.  Bone Health Last bone density scan was in 2022 and was normal. -Schedule bone density scan in the next 3 months.  General Health Maintenance Discussed the importance of regular exercise and moderate alcohol  consumption. -Encourage regular exercise. -Advise moderate alcohol consumption.          Orders Placed This Encounter  Procedures   DG Bone Density    Standing Status:   Future    Expected Date:   02/02/2024    Expiration Date:   11/03/2024    Reason for Exam (SYMPTOM  OR DIAGNOSIS REQUIRED):   post menopausal    Preferred imaging location?:   [facility]    Release to patient:   Immediate   The patient has a good understanding of the overall plan. she agrees with it. she will call with any problems that may develop before the next visit here. Total time spent: 30 mins including face to face time and time spent for planning, charting and co-ordination of care   Misty MARLA Chad, MD 11/04/23

## 2023-11-04 NOTE — Assessment & Plan Note (Signed)
 08/23/2017 left mastectomy: LCIS, invasive cancer not identified in the specimen, 0/4 lymph nodes negative, based on the biopsy, 2 foci of invasive lobular cancer larger span 0.7 cm grade 2 ER 40%, PR 90%, HER-2 negative ratio 1.19, Ki-67 2%, T1BN0 stage I A;  right mastectomy: Atypical lobular hyperplasia   Current treatment: Adjuvant anastrozole  1 mg daily started 09/02/2017   Anastrozole  toxicities: Denies any adverse effects to anastrozole , occasional joint stiffness especially when she sits down for long period of time. Plan to treat her for 7 years.   Breast cancer surveillance: 1.  Breast exam 11/03/2024: Benign 2. mammogram not indicated since she had bilateral mastectomies: 3.  Bone density 08/28/2021 osteopenia T score -0.9: Normal   Issues with breast reconstruction: Because of an extensive issues related to the implant as well as complications related to it, she does not want to do any implant on the left breast.   Return to clinic 1 year for follow-up

## 2023-11-05 ENCOUNTER — Telehealth: Payer: Self-pay

## 2023-11-05 NOTE — Telephone Encounter (Signed)
 Per md orders entered for [employer] and all supported documents faxed to [phone]. Faxed confirmation was received.

## 2023-11-06 ENCOUNTER — Telehealth: Payer: Self-pay

## 2023-11-06 NOTE — Telephone Encounter (Signed)
Open in error

## 2023-11-11 DIAGNOSIS — Z170 Estrogen receptor positive status [ER+]: Secondary | ICD-10-CM | POA: Diagnosis not present

## 2023-11-11 DIAGNOSIS — C50412 Malignant neoplasm of upper-outer quadrant of left female breast: Secondary | ICD-10-CM | POA: Diagnosis not present

## 2023-12-04 ENCOUNTER — Encounter: Payer: Self-pay | Admitting: Hematology and Oncology

## 2024-01-16 DIAGNOSIS — I10 Essential (primary) hypertension: Secondary | ICD-10-CM | POA: Diagnosis not present

## 2024-01-16 DIAGNOSIS — I700 Atherosclerosis of aorta: Secondary | ICD-10-CM | POA: Diagnosis not present

## 2024-01-27 DIAGNOSIS — E7800 Pure hypercholesterolemia, unspecified: Secondary | ICD-10-CM | POA: Diagnosis not present

## 2024-01-27 DIAGNOSIS — I700 Atherosclerosis of aorta: Secondary | ICD-10-CM | POA: Diagnosis not present

## 2024-01-27 DIAGNOSIS — F411 Generalized anxiety disorder: Secondary | ICD-10-CM | POA: Diagnosis not present

## 2024-01-27 DIAGNOSIS — I10 Essential (primary) hypertension: Secondary | ICD-10-CM | POA: Diagnosis not present

## 2024-01-27 DIAGNOSIS — E6609 Other obesity due to excess calories: Secondary | ICD-10-CM | POA: Diagnosis not present

## 2024-01-29 ENCOUNTER — Other Ambulatory Visit (HOSPITAL_BASED_OUTPATIENT_CLINIC_OR_DEPARTMENT_OTHER)

## 2024-02-04 ENCOUNTER — Ambulatory Visit (HOSPITAL_BASED_OUTPATIENT_CLINIC_OR_DEPARTMENT_OTHER)
Admission: RE | Admit: 2024-02-04 | Discharge: 2024-02-04 | Disposition: A | Discharge status: Home / Self Care | Source: Ambulatory Visit | Attending: Hematology and Oncology | Admitting: Hematology and Oncology

## 2024-02-04 DIAGNOSIS — M8588 Other specified disorders of bone density and structure, other site: Secondary | ICD-10-CM | POA: Diagnosis not present

## 2024-02-04 DIAGNOSIS — Z780 Asymptomatic menopausal state: Secondary | ICD-10-CM | POA: Insufficient documentation

## 2024-02-07 DIAGNOSIS — H524 Presbyopia: Secondary | ICD-10-CM | POA: Diagnosis not present

## 2024-02-07 DIAGNOSIS — H04123 Dry eye syndrome of bilateral lacrimal glands: Secondary | ICD-10-CM | POA: Diagnosis not present

## 2024-02-07 DIAGNOSIS — H43813 Vitreous degeneration, bilateral: Secondary | ICD-10-CM | POA: Diagnosis not present

## 2024-02-07 DIAGNOSIS — Z961 Presence of intraocular lens: Secondary | ICD-10-CM | POA: Diagnosis not present

## 2024-02-07 DIAGNOSIS — Z135 Encounter for screening for eye and ear disorders: Secondary | ICD-10-CM | POA: Diagnosis not present

## 2024-02-14 DIAGNOSIS — I700 Atherosclerosis of aorta: Secondary | ICD-10-CM | POA: Diagnosis not present

## 2024-02-14 DIAGNOSIS — I10 Essential (primary) hypertension: Secondary | ICD-10-CM | POA: Diagnosis not present

## 2024-02-26 DIAGNOSIS — D225 Melanocytic nevi of trunk: Secondary | ICD-10-CM | POA: Diagnosis not present

## 2024-02-26 DIAGNOSIS — E7800 Pure hypercholesterolemia, unspecified: Secondary | ICD-10-CM | POA: Diagnosis not present

## 2024-02-26 DIAGNOSIS — L821 Other seborrheic keratosis: Secondary | ICD-10-CM | POA: Diagnosis not present

## 2024-02-26 DIAGNOSIS — E6609 Other obesity due to excess calories: Secondary | ICD-10-CM | POA: Diagnosis not present

## 2024-02-26 DIAGNOSIS — I10 Essential (primary) hypertension: Secondary | ICD-10-CM | POA: Diagnosis not present

## 2024-02-26 DIAGNOSIS — L814 Other melanin hyperpigmentation: Secondary | ICD-10-CM | POA: Diagnosis not present

## 2024-02-26 DIAGNOSIS — F411 Generalized anxiety disorder: Secondary | ICD-10-CM | POA: Diagnosis not present

## 2024-02-26 DIAGNOSIS — H01114 Allergic dermatitis of left upper eyelid: Secondary | ICD-10-CM | POA: Diagnosis not present

## 2024-02-26 DIAGNOSIS — I700 Atherosclerosis of aorta: Secondary | ICD-10-CM | POA: Diagnosis not present

## 2024-02-26 DIAGNOSIS — L578 Other skin changes due to chronic exposure to nonionizing radiation: Secondary | ICD-10-CM | POA: Diagnosis not present

## 2024-03-15 DIAGNOSIS — I10 Essential (primary) hypertension: Secondary | ICD-10-CM | POA: Diagnosis not present

## 2024-03-15 DIAGNOSIS — I700 Atherosclerosis of aorta: Secondary | ICD-10-CM | POA: Diagnosis not present

## 2024-03-17 DIAGNOSIS — M654 Radial styloid tenosynovitis [de Quervain]: Secondary | ICD-10-CM | POA: Diagnosis not present

## 2024-04-14 DIAGNOSIS — R7303 Prediabetes: Secondary | ICD-10-CM | POA: Diagnosis not present

## 2024-04-14 DIAGNOSIS — E7800 Pure hypercholesterolemia, unspecified: Secondary | ICD-10-CM | POA: Diagnosis not present

## 2024-04-14 DIAGNOSIS — F325 Major depressive disorder, single episode, in full remission: Secondary | ICD-10-CM | POA: Diagnosis not present

## 2024-04-14 DIAGNOSIS — C50912 Malignant neoplasm of unspecified site of left female breast: Secondary | ICD-10-CM | POA: Diagnosis not present

## 2024-04-14 DIAGNOSIS — I10 Essential (primary) hypertension: Secondary | ICD-10-CM | POA: Diagnosis not present

## 2024-04-14 DIAGNOSIS — Z6834 Body mass index (BMI) 34.0-34.9, adult: Secondary | ICD-10-CM | POA: Diagnosis not present

## 2024-04-17 DIAGNOSIS — I700 Atherosclerosis of aorta: Secondary | ICD-10-CM | POA: Diagnosis not present

## 2024-04-17 DIAGNOSIS — I10 Essential (primary) hypertension: Secondary | ICD-10-CM | POA: Diagnosis not present

## 2024-04-21 DIAGNOSIS — N952 Postmenopausal atrophic vaginitis: Secondary | ICD-10-CM | POA: Diagnosis not present

## 2024-04-21 DIAGNOSIS — Z124 Encounter for screening for malignant neoplasm of cervix: Secondary | ICD-10-CM | POA: Diagnosis not present

## 2024-04-21 DIAGNOSIS — Z780 Asymptomatic menopausal state: Secondary | ICD-10-CM | POA: Diagnosis not present

## 2024-04-21 DIAGNOSIS — Z01411 Encounter for gynecological examination (general) (routine) with abnormal findings: Secondary | ICD-10-CM | POA: Diagnosis not present

## 2024-04-27 DIAGNOSIS — I10 Essential (primary) hypertension: Secondary | ICD-10-CM | POA: Diagnosis not present

## 2024-04-27 DIAGNOSIS — E7800 Pure hypercholesterolemia, unspecified: Secondary | ICD-10-CM | POA: Diagnosis not present

## 2024-04-27 DIAGNOSIS — E6609 Other obesity due to excess calories: Secondary | ICD-10-CM | POA: Diagnosis not present

## 2024-04-27 DIAGNOSIS — I700 Atherosclerosis of aorta: Secondary | ICD-10-CM | POA: Diagnosis not present

## 2024-04-27 DIAGNOSIS — F411 Generalized anxiety disorder: Secondary | ICD-10-CM | POA: Diagnosis not present

## 2024-05-17 DIAGNOSIS — I10 Essential (primary) hypertension: Secondary | ICD-10-CM | POA: Diagnosis not present

## 2024-05-17 DIAGNOSIS — I700 Atherosclerosis of aorta: Secondary | ICD-10-CM | POA: Diagnosis not present

## 2024-05-28 DIAGNOSIS — E6609 Other obesity due to excess calories: Secondary | ICD-10-CM | POA: Diagnosis not present

## 2024-05-28 DIAGNOSIS — F411 Generalized anxiety disorder: Secondary | ICD-10-CM | POA: Diagnosis not present

## 2024-05-28 DIAGNOSIS — I10 Essential (primary) hypertension: Secondary | ICD-10-CM | POA: Diagnosis not present

## 2024-05-28 DIAGNOSIS — I700 Atherosclerosis of aorta: Secondary | ICD-10-CM | POA: Diagnosis not present

## 2024-05-28 DIAGNOSIS — E7800 Pure hypercholesterolemia, unspecified: Secondary | ICD-10-CM | POA: Diagnosis not present

## 2024-06-11 ENCOUNTER — Telehealth: Payer: Self-pay | Admitting: *Deleted

## 2024-06-11 NOTE — Telephone Encounter (Signed)
 Error

## 2024-06-16 ENCOUNTER — Telehealth: Payer: Self-pay | Admitting: *Deleted

## 2024-06-16 DIAGNOSIS — I10 Essential (primary) hypertension: Secondary | ICD-10-CM | POA: Diagnosis not present

## 2024-06-16 DIAGNOSIS — I700 Atherosclerosis of aorta: Secondary | ICD-10-CM | POA: Diagnosis not present

## 2024-06-16 NOTE — Telephone Encounter (Signed)
 Per MD request RN placed call to pt with recent [employer] results being negative.  Pt educated and verbalized understanding.

## 2024-06-17 ENCOUNTER — Encounter: Payer: Self-pay | Admitting: Hematology and Oncology

## 2024-06-28 DIAGNOSIS — E7800 Pure hypercholesterolemia, unspecified: Secondary | ICD-10-CM | POA: Diagnosis not present

## 2024-06-28 DIAGNOSIS — E6609 Other obesity due to excess calories: Secondary | ICD-10-CM | POA: Diagnosis not present

## 2024-06-28 DIAGNOSIS — I700 Atherosclerosis of aorta: Secondary | ICD-10-CM | POA: Diagnosis not present

## 2024-06-28 DIAGNOSIS — I10 Essential (primary) hypertension: Secondary | ICD-10-CM | POA: Diagnosis not present

## 2024-06-28 DIAGNOSIS — F411 Generalized anxiety disorder: Secondary | ICD-10-CM | POA: Diagnosis not present

## 2024-07-16 DIAGNOSIS — I700 Atherosclerosis of aorta: Secondary | ICD-10-CM | POA: Diagnosis not present

## 2024-07-16 DIAGNOSIS — I10 Essential (primary) hypertension: Secondary | ICD-10-CM | POA: Diagnosis not present

## 2024-07-28 DIAGNOSIS — E7800 Pure hypercholesterolemia, unspecified: Secondary | ICD-10-CM | POA: Diagnosis not present

## 2024-07-28 DIAGNOSIS — I10 Essential (primary) hypertension: Secondary | ICD-10-CM | POA: Diagnosis not present

## 2024-07-28 DIAGNOSIS — F411 Generalized anxiety disorder: Secondary | ICD-10-CM | POA: Diagnosis not present

## 2024-07-28 DIAGNOSIS — E6609 Other obesity due to excess calories: Secondary | ICD-10-CM | POA: Diagnosis not present

## 2024-08-13 DIAGNOSIS — Z23 Encounter for immunization: Secondary | ICD-10-CM | POA: Diagnosis not present

## 2024-08-15 DIAGNOSIS — I700 Atherosclerosis of aorta: Secondary | ICD-10-CM | POA: Diagnosis not present

## 2024-08-15 DIAGNOSIS — I10 Essential (primary) hypertension: Secondary | ICD-10-CM | POA: Diagnosis not present

## 2024-08-28 DIAGNOSIS — E7800 Pure hypercholesterolemia, unspecified: Secondary | ICD-10-CM | POA: Diagnosis not present

## 2024-08-28 DIAGNOSIS — E6609 Other obesity due to excess calories: Secondary | ICD-10-CM | POA: Diagnosis not present

## 2024-08-28 DIAGNOSIS — I10 Essential (primary) hypertension: Secondary | ICD-10-CM | POA: Diagnosis not present

## 2024-08-28 DIAGNOSIS — F411 Generalized anxiety disorder: Secondary | ICD-10-CM | POA: Diagnosis not present

## 2024-08-28 DIAGNOSIS — I700 Atherosclerosis of aorta: Secondary | ICD-10-CM | POA: Diagnosis not present

## 2024-09-14 DIAGNOSIS — I10 Essential (primary) hypertension: Secondary | ICD-10-CM | POA: Diagnosis not present

## 2024-09-14 DIAGNOSIS — I700 Atherosclerosis of aorta: Secondary | ICD-10-CM | POA: Diagnosis not present

## 2024-09-25 ENCOUNTER — Other Ambulatory Visit: Payer: Self-pay | Admitting: Hematology and Oncology

## 2024-09-27 DIAGNOSIS — E6609 Other obesity due to excess calories: Secondary | ICD-10-CM | POA: Diagnosis not present

## 2024-09-27 DIAGNOSIS — I700 Atherosclerosis of aorta: Secondary | ICD-10-CM | POA: Diagnosis not present

## 2024-09-27 DIAGNOSIS — I10 Essential (primary) hypertension: Secondary | ICD-10-CM | POA: Diagnosis not present

## 2024-09-27 DIAGNOSIS — F411 Generalized anxiety disorder: Secondary | ICD-10-CM | POA: Diagnosis not present

## 2024-09-27 DIAGNOSIS — E7800 Pure hypercholesterolemia, unspecified: Secondary | ICD-10-CM | POA: Diagnosis not present

## 2024-10-20 ENCOUNTER — Other Ambulatory Visit: Payer: Self-pay | Admitting: Family Medicine

## 2024-10-20 DIAGNOSIS — R1011 Right upper quadrant pain: Secondary | ICD-10-CM

## 2024-10-28 ENCOUNTER — Inpatient Hospital Stay: Admission: RE | Admit: 2024-10-28 | Discharge: 2024-10-28 | Attending: Family Medicine | Admitting: Family Medicine

## 2024-10-28 DIAGNOSIS — R1011 Right upper quadrant pain: Secondary | ICD-10-CM

## 2024-10-28 MED ORDER — IOPAMIDOL (ISOVUE-300) INJECTION 61%
100.0000 mL | Freq: Once | INTRAVENOUS | Status: AC | PRN
  Administered 2024-10-28: 100 mL via INTRAVENOUS

## 2024-11-04 ENCOUNTER — Inpatient Hospital Stay: Payer: Medicare Other | Attending: Hematology and Oncology | Admitting: Hematology and Oncology

## 2024-11-04 VITALS — BP 138/70 | HR 63 | Temp 98.1°F | Resp 14 | Wt 199.9 lb

## 2024-11-04 DIAGNOSIS — M8588 Other specified disorders of bone density and structure, other site: Secondary | ICD-10-CM | POA: Diagnosis not present

## 2024-11-04 DIAGNOSIS — Z79899 Other long term (current) drug therapy: Secondary | ICD-10-CM | POA: Insufficient documentation

## 2024-11-04 DIAGNOSIS — Z9013 Acquired absence of bilateral breasts and nipples: Secondary | ICD-10-CM | POA: Insufficient documentation

## 2024-11-04 DIAGNOSIS — C50412 Malignant neoplasm of upper-outer quadrant of left female breast: Secondary | ICD-10-CM | POA: Diagnosis not present

## 2024-11-04 DIAGNOSIS — Z853 Personal history of malignant neoplasm of breast: Secondary | ICD-10-CM | POA: Insufficient documentation

## 2024-11-04 DIAGNOSIS — Z170 Estrogen receptor positive status [ER+]: Secondary | ICD-10-CM | POA: Diagnosis not present

## 2024-11-04 DIAGNOSIS — R1011 Right upper quadrant pain: Secondary | ICD-10-CM | POA: Insufficient documentation

## 2024-11-04 DIAGNOSIS — Z08 Encounter for follow-up examination after completed treatment for malignant neoplasm: Secondary | ICD-10-CM | POA: Insufficient documentation

## 2024-11-04 NOTE — Assessment & Plan Note (Addendum)
 08/23/2017 left mastectomy: LCIS, invasive cancer not identified in the specimen, 0/4 lymph nodes negative, based on the biopsy, 2 foci of invasive lobular cancer larger span 0.7 cm grade 2 ER 40%, PR 90%, HER-2 negative ratio 1.19, Ki-67 2%, T1BN0 stage I A;  right mastectomy: Atypical lobular hyperplasia   Current treatment: Adjuvant anastrozole  1 mg daily started 09/02/2017   Anastrozole  toxicities: Denies any adverse effects to anastrozole , occasional joint stiffness especially when she sits down for long period of time. Plan to treat her for 7 years.   Breast cancer surveillance: 1.  Breast exam 11/04/2024: Benign 2. mammogram not indicated since she had bilateral mastectomies: 3.  Bone density 02/05/24 osteopenia T score -1.1: Normal   Issues with breast reconstruction: Because of an extensive issues related to the implant as well as complications related to it, she does not want to do any implant on the left breast.   Return to clinic 1 year for follow-up

## 2024-11-04 NOTE — Progress Notes (Signed)
 "  Patient Care Team: Cleotilde Planas, MD as PCP - General (Family Medicine) Odean Potts, MD as Consulting Physician (Hematology and Oncology) Dewey Rush, MD as Consulting Physician (Radiation Oncology) Crawford Morna Pickle, NP as Nurse Practitioner (Hematology and Oncology)  DIAGNOSIS:  Encounter Diagnosis  Name Primary?   Malignant neoplasm of upper-outer quadrant of left breast in female, estrogen receptor positive ([facility]) Yes    SUMMARY OF ONCOLOGIC HISTORY: Oncology History  Malignant neoplasm of upper-outer quadrant of left breast in female, estrogen receptor positive ([facility])  06/24/2017 Initial Diagnosis   Left breast biopsy upper outer quadrant: Invasive lobular cancer with LCIS; upper inner quadrant: Invasive lobular cancer with ADH, grade 1-2, ER 40%, PR 90%, Ki-67 2%, HER-2 negative ratio 1.19; tumor size indeterminate but based on the 2 mm calcifications it is currently being staged as T1 1 N0 stage IA, however we suspect that it is inaccurate clinical staging   08/23/2017 Surgery   Left mastectomy: LCIS, invasive cancer not identified in the specimen, 0/4 lymph nodes negative, based on the biopsy, 2 foci of invasive lobular cancer larger span 0.7 cm grade 2 ER 40%, PR 90%, HER-2 negative ratio 1.19, Ki-67 2%, T1BN0 stage I A;  right mastectomy: Atypical lobular hyperplasia   08/2017 -  Anti-estrogen oral therapy   Anastrozole  daily   09/17/2017 Surgery   Breast reconstruction surgery by Dr. Arelia     CHIEF COMPLIANT: Follow-up at the end of antiestrogen therapy  HISTORY OF PRESENT ILLNESS:   History of Present Illness Misty Lynch is a 69 year old female with estrogen receptor-positive invasive lobular carcinoma of the left breast, status post bilateral mastectomies and adjuvant anastrozole  therapy, who presents for oncology follow-up after completion of endocrine therapy.  She completed seven years of adjuvant anastrozole  in November 2025 and stopped as planned.  Over the past year she has had mild, intermittent joint stiffness and occasional hot flashes that are not limiting.  She has had no breast pain. Before Thanksgiving she noted transient, non-painful breast area discomfort, which led to a CT scan on December 31. The discomfort has resolved. She has not yet seen the official CT report.  She has mild constipation controlled with hydration and intermittent stool softeners or laxatives, without persistent abdominal pain.  She has occasional mild lower back pain that does not limit activity. Prior bone density monitoring on anastrozole  showed a spine T-score of -1.1 with no hip bone loss.       ALLERGIES:  is allergic to tetracyclines & related.  MEDICATIONS:  Current Outpatient Medications  Medication Sig Dispense Refill   amLODipine  (NORVASC ) 5 MG tablet Take 5 mg by mouth daily.     anastrozole  (ARIMIDEX ) 1 MG tablet TAKE ONE TABLET BY MOUTH DAILY 90 tablet 2   aspirin EC 81 MG tablet Take 81 mg by mouth daily.     Calcium-Vitamin D-Vitamin K 500-100-40 MG-UNT-MCG CHEW Chew 1 tablet by mouth 2 (two) times daily with a meal.      losartan -hydrochlorothiazide  (HYZAAR) 100-25 MG tablet Take 1 tablet by mouth daily.     rosuvastatin (CRESTOR) 5 MG tablet Take 5 mg by mouth daily.     No current facility-administered medications for this visit.    PHYSICAL EXAMINATION: ECOG PERFORMANCE STATUS: 1 - Symptomatic but completely ambulatory  Vitals:   11/04/24 1134  BP: 138/70  Pulse: 63  Resp: 14  Temp: 98.1 F (36.7 C)  SpO2: 100%   Filed Weights   11/04/24 1134  Weight: 199 lb  14.4 oz (90.7 kg)    Physical Exam BREAST: Panniculectomy normal.  (exam performed in the presence of a chaperone)  LABORATORY DATA:  I have reviewed the data as listed    Latest Ref Rng & Units 12/29/2021    6:39 AM 11/27/2021    9:00 AM 03/31/2019    8:32 AM  CMP  Glucose 70 - 99 mg/dL 891  884  886   BUN 8 - 23 mg/dL 12  13  11    Creatinine 0.44 -  1.00 mg/dL 8.97  9.25  9.19   Sodium 135 - 145 mmol/L 137  138  141   Potassium 3.5 - 5.1 mmol/L 3.3  3.4  3.4   Chloride 98 - 111 mmol/L 100  97  102   CO2 22 - 32 mmol/L 29  29  29    Calcium 8.9 - 10.3 mg/dL 9.5  9.6  9.8     Lab Results  Component Value Date   WBC 7.7 10/27/2018   HGB 13.8 10/27/2018   HCT 42.4 10/27/2018   MCV 89.3 10/27/2018   PLT 288 10/27/2018   NEUTROABS 4.7 10/27/2018    ASSESSMENT & PLAN:  Malignant neoplasm of upper-outer quadrant of left breast in female, estrogen receptor positive ([facility]) 08/23/2017 left mastectomy: LCIS, invasive cancer not identified in the specimen, 0/4 lymph nodes negative, based on the biopsy, 2 foci of invasive lobular cancer larger span 0.7 cm grade 2 ER 40%, PR 90%, HER-2 negative ratio 1.19, Ki-67 2%, T1BN0 stage I A;  right mastectomy: Atypical lobular hyperplasia   Current treatment: Adjuvant anastrozole  1 mg daily started 09/02/2017   Anastrozole  toxicities: Denies any adverse effects to anastrozole , occasional joint stiffness especially when she sits down for long period of time. Plan to treat her for 7 years.   Breast cancer surveillance: 1.  Breast exam 11/04/2024: Benign 2. mammogram not indicated since she had bilateral mastectomies: 3.  Bone density 02/05/24 osteopenia T score -1.1: Normal   Issues with breast reconstruction: Because of an extensive issues related to the implant as well as complications related to it, she does not want to do any implant on the left breast.   Abdominal discomfort right upper quadrant: I reviewed the CT scan the official reading has not been read.  I do not see any specific abnormalities other than constipation and some degree of fatty liver.  Return to clinic 1 year for follow-up     No orders of the defined types were placed in this encounter.  The patient has a good understanding of the overall plan. she agrees with it. she will call with any problems that may develop before the next  visit here.  I personally spent a total of 30 minutes in the care of the patient today including preparing to see the patient, getting/reviewing separately obtained history, performing a medically appropriate exam/evaluation, counseling and educating, placing orders, referring and communicating with other health care professionals, documenting clinical information in the EHR, independently interpreting results, communicating results, and coordinating care.   Viinay K Shyler Hamill, MD 11/04/2024    "
